# Patient Record
Sex: Male | Born: 1950
Health system: Southern US, Community
[De-identification: ages and names within clinical notes are randomized; demographics above are authoritative.]

## PROBLEM LIST (undated history)

## (undated) DIAGNOSIS — N529 Male erectile dysfunction, unspecified: Secondary | ICD-10-CM

## (undated) DIAGNOSIS — Z973 Presence of spectacles and contact lenses: Secondary | ICD-10-CM

## (undated) DIAGNOSIS — H269 Unspecified cataract: Secondary | ICD-10-CM

## (undated) DIAGNOSIS — F32A Depression, unspecified: Secondary | ICD-10-CM

## (undated) DIAGNOSIS — G4733 Obstructive sleep apnea (adult) (pediatric): Secondary | ICD-10-CM

## (undated) DIAGNOSIS — J189 Pneumonia, unspecified organism: Secondary | ICD-10-CM

## (undated) DIAGNOSIS — Z974 Presence of external hearing-aid: Secondary | ICD-10-CM

## (undated) DIAGNOSIS — M1990 Unspecified osteoarthritis, unspecified site: Secondary | ICD-10-CM

## (undated) DIAGNOSIS — E119 Type 2 diabetes mellitus without complications: Secondary | ICD-10-CM

## (undated) DIAGNOSIS — F329 Major depressive disorder, single episode, unspecified: Secondary | ICD-10-CM

## (undated) DIAGNOSIS — E785 Hyperlipidemia, unspecified: Secondary | ICD-10-CM

## (undated) DIAGNOSIS — M48062 Spinal stenosis, lumbar region with neurogenic claudication: Secondary | ICD-10-CM

## (undated) DIAGNOSIS — G4730 Sleep apnea, unspecified: Secondary | ICD-10-CM

## (undated) DIAGNOSIS — F411 Generalized anxiety disorder: Secondary | ICD-10-CM

## (undated) DIAGNOSIS — G894 Chronic pain syndrome: Secondary | ICD-10-CM

## (undated) DIAGNOSIS — J309 Allergic rhinitis, unspecified: Secondary | ICD-10-CM

## (undated) DIAGNOSIS — Z9989 Dependence on other enabling machines and devices: Secondary | ICD-10-CM

## (undated) DIAGNOSIS — I10 Essential (primary) hypertension: Secondary | ICD-10-CM

## (undated) DIAGNOSIS — T7840XA Allergy, unspecified, initial encounter: Secondary | ICD-10-CM

## (undated) HISTORY — DX: Chronic pain syndrome: G89.4

## (undated) HISTORY — DX: Spinal stenosis, lumbar region with neurogenic claudication: M48.062

## (undated) HISTORY — DX: Hyperlipidemia, unspecified: E78.5

## (undated) HISTORY — DX: Allergic rhinitis, unspecified: J30.9

## (undated) HISTORY — DX: Generalized anxiety disorder: F41.1

## (undated) HISTORY — DX: Unspecified cataract: H26.9

## (undated) HISTORY — DX: Essential (primary) hypertension: I10

## (undated) HISTORY — PX: EYE SURGERY: SHX253

## (undated) HISTORY — DX: Allergy, unspecified, initial encounter: T78.40XA

## (undated) HISTORY — PX: COLONOSCOPY: SHX174

## (undated) HISTORY — PX: ANKLE FRACTURE SURGERY: SHX122

## (undated) HISTORY — DX: Male erectile dysfunction, unspecified: N52.9

## (undated) HISTORY — PX: SHOULDER ARTHROSCOPY: SHX128

## (undated) HISTORY — DX: Type 2 diabetes mellitus without complications: E11.9

---

## 2005-01-07 HISTORY — PX: KNEE ARTHROSCOPY: SHX127

## 2008-11-23 ENCOUNTER — Encounter: Admission: RE | Admit: 2008-11-23 | Discharge: 2008-11-23 | Payer: Self-pay | Admitting: Sports Medicine

## 2008-12-14 ENCOUNTER — Ambulatory Visit (HOSPITAL_BASED_OUTPATIENT_CLINIC_OR_DEPARTMENT_OTHER): Admission: RE | Admit: 2008-12-14 | Discharge: 2008-12-15 | Payer: Self-pay | Admitting: Orthopedic Surgery

## 2010-04-10 LAB — BASIC METABOLIC PANEL
BUN: 12 mg/dL (ref 6–23)
CO2: 28 mEq/L (ref 19–32)
Calcium: 9.3 mg/dL (ref 8.4–10.5)
Chloride: 104 mEq/L (ref 96–112)
Creatinine, Ser: 0.79 mg/dL (ref 0.4–1.5)
GFR calc Af Amer: >60 mL/min (ref >60)
GFR calc non Af Amer: >60 mL/min (ref >60)
Glucose, Bld: 116 mg/dL — ABNORMAL HIGH (ref 70–99)
Potassium: 4.4 mEq/L (ref 3.5–5.1)
Sodium: 139 mEq/L (ref 135–145)

## 2010-04-10 LAB — GLUCOSE, CAPILLARY
Glucose-Capillary: 106 mg/dL — ABNORMAL HIGH (ref 70–99)
Glucose-Capillary: 126 mg/dL — ABNORMAL HIGH (ref 70–99)

## 2010-04-10 LAB — POCT HEMOGLOBIN-HEMACUE: Hemoglobin: 15.4 g/dL (ref 13.0–17.0)

## 2011-09-30 ENCOUNTER — Encounter (HOSPITAL_BASED_OUTPATIENT_CLINIC_OR_DEPARTMENT_OTHER): Payer: Self-pay | Admitting: *Deleted

## 2011-09-30 NOTE — Progress Notes (Signed)
Pt here 2010 for shoulder-did well-does use a cpap-will bring dos-will wear post op To come in for bmet-ekg

## 2011-10-01 ENCOUNTER — Encounter (HOSPITAL_BASED_OUTPATIENT_CLINIC_OR_DEPARTMENT_OTHER)
Admission: RE | Admit: 2011-10-01 | Discharge: 2011-10-01 | Disposition: A | Discharge status: Home / Self Care | Payer: 59 | Source: Ambulatory Visit | Attending: Orthopedic Surgery | Admitting: Orthopedic Surgery

## 2011-10-01 LAB — BASIC METABOLIC PANEL
BUN: 12 mg/dL (ref 6–23)
CO2: 25 mEq/L (ref 19–32)
Calcium: 9.6 mg/dL (ref 8.4–10.5)
Chloride: 102 mEq/L (ref 96–112)
Creatinine, Ser: 0.75 mg/dL (ref 0.50–1.35)
GFR calc Af Amer: >90 mL/min (ref >90)
GFR calc non Af Amer: >90 mL/min (ref >90)
Glucose, Bld: 208 mg/dL — ABNORMAL HIGH (ref 70–99)
Potassium: 4.6 mEq/L (ref 3.5–5.1)
Sodium: 137 mEq/L (ref 135–145)

## 2011-10-01 NOTE — Progress Notes (Signed)
Dr Gypsy Balsam cleared ekg

## 2011-10-02 NOTE — H&P (Signed)
[facility] [address]   [address] [place], [place] [address] [phone] A Division of [facility]  Loreta Ave, M.D.   Robert A. Thurston Hole, M.D.   Burnell Blanks, M.D.   Eulas Post, M.D.   Lunette Stands, M.D Buford Dresser, M.D.  Charlsie Quest, M.D.   Estell Harpin, M.D.   Melina Fiddler, M.D. Genene Churn. Barry Dienes, PA-C            Kirstin A. Shepperson, PA-C Josh Kensington, PA-C [place], [place]   RE: Albert Benjamin, Albert Benjamin                                [id number]      DOB: 12/09/1950 PROGRESS NOTE: 09-04-11 SUBJECTIVE: Albert Benjamin is a 61 year-old man here to discuss his right knee pain.  He has had worsening chronic knee pain for the last four weeks, however yesterday he felt a pop on the medial aspect of his right knee and his knee has become locked in extension.  He notes significant pain when he tries to flex his knee.  Additionally, he describes a mild effusion.  Some over the counter pain medications have been minimally effective.  He mostly experiences pain on the medial aspect of his knee without radiation.  No weakness or numbness.   Past medical history: Significant for diabetes and a history of a left knee arthroscopic procedure for a similar incident.  Review of systems: Please see HPI. Allergies: No known drug allergies. Current medications: Significant for Citalopram, Glimepiride and Singulair.   Family history: Significant for diabetes. Social history: He is a nonsmoker.  OBJECTIVE: In general, well appearing man in no acute distress.  Height: 5?9.  Weight: 250 pounds.  Right knee mild effusion.  Tenderness to palpation over the medial joint line.  Range of motion, patient is unable to flex his knee further than about 20 degrees.  Unable to fully do McMurray's, however positive with rotation of the knee on the medial aspect.  Negative Lachman.  Negative valgus and varus stress.  Vascular exam: Pulses are  2+ throughout.        X-RAYS: Three view knee does not show any fractures or misalignment.    ASSESSMENT: Right knee pain.  PLAN: Suspect meniscus injury, plan for MRI to fully work this issue up.  We will call the patient after the MRI and schedule with surgeon if needed.  Patient expresses understanding.    Estell Harpin, M.D.  Electronically verified by Estell Harpin, M.D. JSK(ESC):jjh Cc: Dr. Michel Santee Eyk  Jenie Parish/[facility] [address]   [address] [place], [place] [address] [phone] A Division of [facility]  Loreta Ave, M.D.   Robert A. Thurston Hole, M.D.   Burnell Blanks, M.D.   Eulas Post, M.D.   Lunette Stands, M.D Buford Dresser, M.D.  Charlsie Quest, M.D.   Estell Harpin, M.D.   Melina Fiddler, M.D. Genene Churn. Barry Dienes, PA-C            Kirstin A. Shepperson, PA-C Josh Spurgeon, PA-C [place], [place]   RE: Albert Benjamin, Albert Benjamin                                [id number]      DOB: 11-15-1950  PROGRESS NOTE: 09-10-11 Albert Benjamin returns for follow up to review MRI of his right knee.  Referred by Dr. Farris Has.  This is an old patient of mine that I have operated on in the past, including a left knee arthroscopy for meniscus tear on August 27, 2004.  Fully recovered, rehabbed and he is pleased with those results.  Right rotator cuff repair on May 02, 2003.  Right shoulder Bankart reconstruction and repair of tuberosity fracture on December 14, 2008.  All of these fully recovered and rehabbed. New issue is his right knee.  Insidious onset of symptoms.  Getting worse and worse.  Progressing to locking and catching.  Affecting all activities, including activities of daily living.  X-rays done in the past show some medial joint space narrowing on the left, the right looks fairly good.  No acute fractures.  MRI of his right knee from September 06, 2011 shows medial meniscus tear, as well as some chondromalacia patellofemoral  joint and medial compartment.  All of these have been reviewed and discussed with him.   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  He comes in to discuss definitive treatment.   Entire history is reviewed, updated and included in the chart.  EXAMINATION: General exam is outlined and included in the chart.  Specifically, right knee point tender medial joint line.  Positive McMurray's.  Trace effusion.  Stable ligaments.  Neurovascularly intact distally.    DISPOSITION:  Medial meniscus tear, right knee.  Sufficient symptoms to warrant treatment.  Marked mechanical symptoms interfering with all activity.  Discussed exam under anesthesia, arthroscopy, debridement.  He cannot work now and I am keeping him out of work from now until approximately six weeks post-op.  I went over all of this with him and he understands.  All questions answered.  All paperwork complete.  I will see him at the time of operative intervention.    Loreta Ave, M.D.   Electronically verified by Loreta Ave, M.D. DFM:jjh D 09-10-11 T 09-11-11

## 2011-10-03 ENCOUNTER — Ambulatory Visit (HOSPITAL_BASED_OUTPATIENT_CLINIC_OR_DEPARTMENT_OTHER): Payer: 59 | Admitting: *Deleted

## 2011-10-03 ENCOUNTER — Encounter (HOSPITAL_BASED_OUTPATIENT_CLINIC_OR_DEPARTMENT_OTHER): Payer: Self-pay | Admitting: *Deleted

## 2011-10-03 ENCOUNTER — Ambulatory Visit (HOSPITAL_BASED_OUTPATIENT_CLINIC_OR_DEPARTMENT_OTHER)
Admission: RE | Admit: 2011-10-03 | Discharge: 2011-10-03 | Disposition: A | Discharge status: Home / Self Care | Payer: 59 | Source: Ambulatory Visit | Attending: Orthopedic Surgery | Admitting: Orthopedic Surgery

## 2011-10-03 ENCOUNTER — Encounter (HOSPITAL_BASED_OUTPATIENT_CLINIC_OR_DEPARTMENT_OTHER)
Admission: RE | Disposition: A | Discharge status: Home / Self Care | Payer: Self-pay | Source: Ambulatory Visit | Attending: Orthopedic Surgery

## 2011-10-03 DIAGNOSIS — M23305 Other meniscus derangements, unspecified medial meniscus, unspecified knee: Secondary | ICD-10-CM | POA: Insufficient documentation

## 2011-10-03 DIAGNOSIS — G4730 Sleep apnea, unspecified: Secondary | ICD-10-CM | POA: Insufficient documentation

## 2011-10-03 DIAGNOSIS — Z01812 Encounter for preprocedural laboratory examination: Secondary | ICD-10-CM | POA: Insufficient documentation

## 2011-10-03 DIAGNOSIS — Z9889 Other specified postprocedural states: Secondary | ICD-10-CM

## 2011-10-03 DIAGNOSIS — E119 Type 2 diabetes mellitus without complications: Secondary | ICD-10-CM | POA: Insufficient documentation

## 2011-10-03 DIAGNOSIS — Z01810 Encounter for preprocedural cardiovascular examination: Secondary | ICD-10-CM | POA: Insufficient documentation

## 2011-10-03 DIAGNOSIS — M2240 Chondromalacia patellae, unspecified knee: Secondary | ICD-10-CM | POA: Insufficient documentation

## 2011-10-03 DIAGNOSIS — J45909 Unspecified asthma, uncomplicated: Secondary | ICD-10-CM | POA: Insufficient documentation

## 2011-10-03 HISTORY — DX: Presence of external hearing-aid: Z97.4

## 2011-10-03 HISTORY — DX: Major depressive disorder, single episode, unspecified: F32.9

## 2011-10-03 HISTORY — DX: Depression, unspecified: F32.A

## 2011-10-03 HISTORY — DX: Unspecified osteoarthritis, unspecified site: M19.90

## 2011-10-03 HISTORY — DX: Sleep apnea, unspecified: G47.30

## 2011-10-03 HISTORY — DX: Presence of spectacles and contact lenses: Z97.3

## 2011-10-03 HISTORY — DX: Obstructive sleep apnea (adult) (pediatric): G47.33

## 2011-10-03 HISTORY — DX: Dependence on other enabling machines and devices: Z99.89

## 2011-10-03 LAB — GLUCOSE, CAPILLARY
Glucose-Capillary: 152 mg/dL — ABNORMAL HIGH (ref 70–99)
Glucose-Capillary: 144 mg/dL — ABNORMAL HIGH (ref 70–99)

## 2011-10-03 SURGERY — ARTHROSCOPY, KNEE, WITH MEDIAL MENISCECTOMY
Anesthesia: General | Site: Knee | Laterality: Right | Wound class: Clean

## 2011-10-03 MED ORDER — LIDOCAINE HCL (CARDIAC) 20 MG/ML IV SOLN
INTRAVENOUS | Status: DC | PRN
  Administered 2011-10-03: 40 mg via INTRAVENOUS

## 2011-10-03 MED ORDER — METHYLPREDNISOLONE ACETATE 80 MG/ML IJ SUSP
INTRAMUSCULAR | Status: DC | PRN
  Administered 2011-10-03: 80 mg via INTRA_ARTICULAR

## 2011-10-03 MED ORDER — ONDANSETRON HCL 4 MG/2ML IJ SOLN
INTRAMUSCULAR | Status: DC | PRN
  Administered 2011-10-03: 4 mg via INTRAVENOUS

## 2011-10-03 MED ORDER — DEXAMETHASONE SODIUM PHOSPHATE 10 MG/ML IJ SOLN
INTRAMUSCULAR | Status: DC | PRN
  Administered 2011-10-03: 5 mg via INTRAVENOUS

## 2011-10-03 MED ORDER — LACTATED RINGERS IV SOLN
INTRAVENOUS | Status: DC
  Administered 2011-10-03 (×2): via INTRAVENOUS

## 2011-10-03 MED ORDER — MEPERIDINE HCL 25 MG/ML IJ SOLN: 6.2500 mg | INTRAMUSCULAR | Status: DC | PRN

## 2011-10-03 MED ORDER — EPHEDRINE SULFATE 50 MG/ML IJ SOLN
INTRAMUSCULAR | Status: DC | PRN
  Administered 2011-10-03: 10 mg via INTRAVENOUS

## 2011-10-03 MED ORDER — MIDAZOLAM HCL 2 MG/2ML IJ SOLN: 0.5000 mg | Freq: Once | INTRAMUSCULAR | Status: DC | PRN

## 2011-10-03 MED ORDER — OXYCODONE-ACETAMINOPHEN 5-325 MG PO TABS
1.0000 | ORAL_TABLET | Freq: Once | ORAL | Status: AC | PRN
  Administered 2011-10-03: 1 via ORAL

## 2011-10-03 MED ORDER — BUPIVACAINE HCL (PF) 0.5 % IJ SOLN
INTRAMUSCULAR | Status: DC | PRN
  Administered 2011-10-03: 20 mL via INTRA_ARTICULAR

## 2011-10-03 MED ORDER — HYDROMORPHONE HCL PF 1 MG/ML IJ SOLN
0.2500 mg | INTRAMUSCULAR | Status: DC | PRN
  Administered 2011-10-03: 0.5 mg via INTRAVENOUS

## 2011-10-03 MED ORDER — MIDAZOLAM HCL 5 MG/5ML IJ SOLN
INTRAMUSCULAR | Status: DC | PRN
  Administered 2011-10-03: 1 mg via INTRAVENOUS

## 2011-10-03 MED ORDER — FENTANYL CITRATE 0.05 MG/ML IJ SOLN
INTRAMUSCULAR | Status: DC | PRN
Start: 2011-10-03 — End: 2011-10-03
  Administered 2011-10-03 (×2): 25 ug via INTRAVENOUS
  Administered 2011-10-03: 50 ug via INTRAVENOUS

## 2011-10-03 MED ORDER — SODIUM CHLORIDE 0.9 % IR SOLN
Status: DC | PRN
  Administered 2011-10-03: 9000 mL

## 2011-10-03 MED ORDER — PROMETHAZINE HCL 25 MG/ML IJ SOLN: 6.2500 mg | INTRAMUSCULAR | Status: DC | PRN

## 2011-10-03 MED ORDER — OXYCODONE-ACETAMINOPHEN 5-325 MG PO TABS: 1.0000 | ORAL_TABLET | ORAL | Status: DC | PRN

## 2011-10-03 MED ORDER — PROPOFOL 10 MG/ML IV BOLUS
INTRAVENOUS | Status: DC | PRN
  Administered 2011-10-03: 300 mg via INTRAVENOUS

## 2011-10-03 MED ORDER — CEFAZOLIN SODIUM-DEXTROSE 2-3 GM-% IV SOLR
2.0000 g | INTRAVENOUS | Status: AC
  Administered 2011-10-03: 2 g via INTRAVENOUS

## 2011-10-03 SURGICAL SUPPLY — 38 items
BANDAGE ELASTIC 6 VELCRO ST LF (GAUZE/BANDAGES/DRESSINGS) ×2 IMPLANT
BLADE CUDA 5.5 (BLADE) IMPLANT
BLADE CUDA GRT WHITE 3.5 (BLADE) IMPLANT
BLADE CUTTER GATOR 3.5 (BLADE) ×2 IMPLANT
BLADE CUTTER MENIS 5.5 (BLADE) IMPLANT
BLADE GREAT WHITE 4.2 (BLADE) ×2 IMPLANT
BUR OVAL 4.0 (BURR) IMPLANT
CANISTER OMNI JUG 16 LITER (MISCELLANEOUS) ×2 IMPLANT
CANISTER SUCTION 2500CC (MISCELLANEOUS) IMPLANT
CLOTH BEACON ORANGE TIMEOUT ST (SAFETY) ×2 IMPLANT
CUTTER MENISCUS  4.2MM (BLADE)
CUTTER MENISCUS 4.2MM (BLADE) IMPLANT
DRAPE ARTHROSCOPY W/POUCH 90 (DRAPES) ×2 IMPLANT
DURAPREP 26ML APPLICATOR (WOUND CARE) ×2 IMPLANT
ELECT MENISCUS 165MM 90D (ELECTRODE) ×2 IMPLANT
ELECT REM PT RETURN 9FT ADLT (ELECTROSURGICAL) ×2
ELECTRODE REM PT RTRN 9FT ADLT (ELECTROSURGICAL) ×1 IMPLANT
GAUZE XEROFORM 1X8 LF (GAUZE/BANDAGES/DRESSINGS) ×2 IMPLANT
GLOVE BIO SURGEON STRL SZ 6.5 (GLOVE) ×2 IMPLANT
GLOVE BIOGEL PI IND STRL 7.0 (GLOVE) ×1 IMPLANT
GLOVE BIOGEL PI IND STRL 8 (GLOVE) ×1 IMPLANT
GLOVE BIOGEL PI INDICATOR 7.0 (GLOVE) ×1
GLOVE BIOGEL PI INDICATOR 8 (GLOVE) ×1
GLOVE ORTHO TXT STRL SZ7.5 (GLOVE) ×4 IMPLANT
GOWN PREVENTION PLUS XLARGE (GOWN DISPOSABLE) ×4 IMPLANT
GOWN STRL REIN 2XL XLG LVL4 (GOWN DISPOSABLE) ×2 IMPLANT
HOLDER KNEE FOAM BLUE (MISCELLANEOUS) ×2 IMPLANT
KNEE WRAP E Z 3 GEL PACK (MISCELLANEOUS) ×2 IMPLANT
PACK ARTHROSCOPY DSU (CUSTOM PROCEDURE TRAY) ×2 IMPLANT
PACK BASIN DAY SURGERY FS (CUSTOM PROCEDURE TRAY) ×2 IMPLANT
PENCIL BUTTON HOLSTER BLD 10FT (ELECTRODE) ×2 IMPLANT
SET ARTHROSCOPY TUBING (MISCELLANEOUS) ×1
SET ARTHROSCOPY TUBING LN (MISCELLANEOUS) ×1 IMPLANT
SPONGE GAUZE 4X4 12PLY (GAUZE/BANDAGES/DRESSINGS) ×4 IMPLANT
SUT ETHILON 3 0 PS 1 (SUTURE) ×2 IMPLANT
SUT VIC AB 3-0 FS2 27 (SUTURE) IMPLANT
TOWEL OR 17X24 6PK STRL BLUE (TOWEL DISPOSABLE) ×2 IMPLANT
WATER STERILE IRR 1000ML POUR (IV SOLUTION) ×2 IMPLANT

## 2011-10-03 NOTE — Anesthesia Preprocedure Evaluation (Addendum)
Anesthesia Evaluation  Patient identified by MRN, date of birth, ID band Patient awake    Reviewed: Allergy & Precautions, H&P , NPO status , Patient's Chart, lab work & pertinent test results  History of Anesthesia Complications Negative for: history of anesthetic complications  Airway Mallampati: III TM Distance: >3 FB Neck ROM: Full    Dental  (+) Teeth Intact, Dental Advisory Given and Caps   Pulmonary asthma (last inhaler needed 3 weeks ago) , sleep apnea and Continuous Positive Airway Pressure Ventilation ,  breath sounds clear to auscultation  Pulmonary exam normal       Cardiovascular negative cardio ROS  Rhythm:Regular Rate:Normal     Neuro/Psych PSYCHIATRIC DISORDERS Depression negative neurological ROS     GI/Hepatic negative GI ROS, Neg liver ROS,   Endo/Other  diabetes (glu 152), Well Controlled, Type 2, Oral Hypoglycemic AgentsMorbid obesity  Renal/GU negative Renal ROS     Musculoskeletal   Abdominal (+) + obese,   Peds  Hematology   Anesthesia Other Findings   Reproductive/Obstetrics                           Anesthesia Physical Anesthesia Plan  ASA: III  Anesthesia Plan: General   Post-op Pain Management:    Induction: Intravenous  Airway Management Planned: LMA  Additional Equipment:   Intra-op Plan:   Post-operative Plan:   Informed Consent: I have reviewed the patients History and Physical, chart, labs and discussed the procedure including the risks, benefits and alternatives for the proposed anesthesia with the patient or authorized representative who has indicated his/her understanding and acceptance.   Dental advisory given  Plan Discussed with: CRNA and Surgeon  Anesthesia Plan Comments: (Plan routine monitors, GA- LMA OK)        Anesthesia Quick Evaluation

## 2011-10-03 NOTE — Brief Op Note (Signed)
10/03/2011  8:43 AM  PATIENT:  Darcey Nora  61 y.o. male  PRE-OPERATIVE DIAGNOSIS:  right knee medial meniscus tear  POST-OPERATIVE DIAGNOSIS:  right knee medial meniscus tear  PROCEDURE:  Procedure(s) (LRB) with comments: KNEE ARTHROSCOPY WITH MEDIAL MENISECTOMY (Right) - right knee arthroscopy with medial menisectomy   SURGEON:  Surgeon(s) and Role:    * Loreta Ave, MD - Primary  PHYSICIAN ASSISTANT: Zonia Kief M   ANESTHESIA:   general  EBL:  Total I/O In: 1200 [I.V.:1200] Out: -   SPECIMEN:  No Specimen  DISPOSITION OF SPECIMEN:  N/A  COUNTS:  YES PATIENT DISPOSITION:  PACU - hemodynamically stable.

## 2011-10-03 NOTE — Anesthesia Procedure Notes (Signed)
Procedure Name: LMA Insertion Date/Time: 10/03/2011 7:47 AM Performed by: Meyer Russel Pre-anesthesia Checklist: Patient identified, Emergency Drugs available, Suction available and Patient being monitored Patient Re-evaluated:Patient Re-evaluated prior to inductionOxygen Delivery Method: Circle System Utilized Preoxygenation: Pre-oxygenation with 100% oxygen Intubation Type: IV induction Ventilation: Mask ventilation without difficulty LMA: LMA inserted LMA Size: 5.0 Number of attempts: 1 Airway Equipment and Method: bite block Placement Confirmation: positive ETCO2 and breath sounds checked- equal and bilateral Tube secured with: Tape Dental Injury: Teeth and Oropharynx as per pre-operative assessment

## 2011-10-03 NOTE — Transfer of Care (Signed)
Immediate Anesthesia Transfer of Care Note  Patient: Albert Benjamin  Procedure(s) Performed: Procedure(s) (LRB) with comments: KNEE ARTHROSCOPY WITH MEDIAL MENISECTOMY (Right) - right knee arthroscopy with medial menisectomy   Patient Location: PACU  Anesthesia Type: General  Level of Consciousness: awake and sedated  Airway & Oxygen Therapy: Patient Spontanous Breathing and Patient connected to face mask oxygen  Post-op Assessment: Report given to PACU RN, Post -op Vital signs reviewed and stable and Patient moving all extremities  Post vital signs: Reviewed and stable  Complications: No apparent anesthesia complications

## 2011-10-03 NOTE — Anesthesia Postprocedure Evaluation (Signed)
  Anesthesia Post-op Note  Patient: Albert Benjamin  Procedure(s) Performed: Procedure(s) (LRB) with comments: KNEE ARTHROSCOPY WITH MEDIAL MENISECTOMY (Right) - right knee arthroscopy with medial menisectomy   Patient Location: PACU  Anesthesia Type: General  Level of Consciousness: awake, alert , oriented and patient cooperative  Airway and Oxygen Therapy: Patient Spontanous Breathing  Post-op Pain: none  Post-op Assessment: Post-op Vital signs reviewed, Patient's Cardiovascular Status Stable, Respiratory Function Stable, Patent Airway, No signs of Nausea or vomiting and Pain level controlled  Post-op Vital Signs: Reviewed and stable  Complications: No apparent anesthesia complications

## 2011-10-03 NOTE — Interval H&P Note (Signed)
History and Physical Interval Note:  10/03/2011 7:34 AM  Albert Benjamin  has presented today for surgery, with the diagnosis of right knee tear meniscus medial   The various methods of treatment have been discussed with the patient and family. After consideration of risks, benefits and other options for treatment, the patient has consented to  Procedure(s) (LRB) with comments: KNEE ARTHROSCOPY WITH MEDIAL MENISECTOMY (Right) - right knee arthroscopy with menisectomy medial as a surgical intervention .  The patient's history has been reviewed, patient examined, no change in status, stable for surgery.  I have reviewed the patient's chart and labs.  Questions were answered to the patient's satisfaction.     Celines Femia F

## 2011-10-04 LAB — POCT HEMOGLOBIN-HEMACUE: Hemoglobin: 12.3 g/dL — ABNORMAL LOW (ref 13.0–17.0)

## 2011-10-04 NOTE — Op Note (Signed)
NAMEILHAN, MADAN NO.:  [id number]  MEDICAL RECORD NO.:  [id number]  LOCATION:                                 FACILITY:  PHYSICIAN:  Loreta Ave, M.D. DATE OF BIRTH:  1950-03-07  DATE OF PROCEDURE:  10/03/2011 DATE OF DISCHARGE:                              OPERATIVE REPORT   PREOPERATIVE DIAGNOSIS:  Right knee medial meniscus tear.  POSTOPERATIVE DIAGNOSIS:  Right knee medial meniscus tear with some grade 2 chondral changes, patellofemoral joint as well as medial compartment.  PROCEDURES: 1. Right knee exam under anesthesia, arthroscopy. 2. Partial medial meniscectomy. 3. Chondroplasty, patellofemoral joint and medial femoral condyle.  SURGEON:  Loreta Ave, M.D.  ASSISTANT:  Genene Churn. Denton Meek.  ANESTHESIA:  General.  BLOOD LOSS:  Minimal.  SPECIMENS:  None.  CULTURES:  None.  COMPLICATIONS:  None.  DRESSINGS:  Sterile compressive.  PROCEDURE IN DETAIL:  The patient brought to the operating room and placed on the operating table in supine position.  After adequate anesthesia had been obtained, leg holder applied.  Leg prepped and draped in usual sterile fashion.  Two portals, one in each medial and lateral parapatellar.  Arthroscope was introduced, knee extended and inspected.  Complex tearing, medial meniscus.  Radial tear __________ extension into the posterior half with degeneration throughout. Posterior third half of the meniscus removed, tapered into remaining meniscus.  Grade 2 and mild grade 3 changes on the condyle debrided. Some grade 2 changes, patellofemoral joint debrided as well.  Lateral meniscus, lateral compartment, cruciate ligaments intact.  Entire knee examined, no other findings appreciated.  Instruments were removed.  Portals injected with Marcaine and closed with nylon.  Knee injected with Depo-Medrol and Marcaine. Anesthesia reversed.  Brought to recovery room.  Tolerated the surgery well.  No  complications.     Loreta Ave, M.D.     DFM/MEDQ  D:  10/03/2011  T:  10/04/2011  Job:  [id number]

## 2012-02-18 ENCOUNTER — Other Ambulatory Visit (HOSPITAL_COMMUNITY): Payer: Self-pay | Admitting: Orthopedic Surgery

## 2012-02-18 ENCOUNTER — Ambulatory Visit (HOSPITAL_COMMUNITY)
Admission: RE | Admit: 2012-02-18 | Discharge: 2012-02-18 | Disposition: A | Discharge status: Home / Self Care | Payer: 59 | Source: Ambulatory Visit | Attending: Orthopedic Surgery | Admitting: Orthopedic Surgery

## 2012-02-18 DIAGNOSIS — M542 Cervicalgia: Secondary | ICD-10-CM

## 2012-02-18 DIAGNOSIS — M5020 Other cervical disc displacement, unspecified cervical region: Secondary | ICD-10-CM | POA: Insufficient documentation

## 2012-02-18 DIAGNOSIS — M79609 Pain in unspecified limb: Secondary | ICD-10-CM | POA: Insufficient documentation

## 2012-02-18 DIAGNOSIS — R29898 Other symptoms and signs involving the musculoskeletal system: Secondary | ICD-10-CM | POA: Insufficient documentation

## 2012-02-18 DIAGNOSIS — M47812 Spondylosis without myelopathy or radiculopathy, cervical region: Secondary | ICD-10-CM | POA: Insufficient documentation

## 2012-03-02 ENCOUNTER — Other Ambulatory Visit: Payer: Self-pay | Admitting: Neurosurgery

## 2012-03-03 ENCOUNTER — Encounter (HOSPITAL_COMMUNITY): Payer: Self-pay | Admitting: Pharmacy Technician

## 2012-03-06 ENCOUNTER — Encounter (HOSPITAL_COMMUNITY)
Admission: RE | Admit: 2012-03-06 | Discharge: 2012-03-06 | Disposition: A | Discharge status: Home / Self Care | Payer: 59 | Source: Ambulatory Visit | Attending: Neurosurgery | Admitting: Neurosurgery

## 2012-03-06 ENCOUNTER — Encounter (HOSPITAL_COMMUNITY): Payer: Self-pay

## 2012-03-06 LAB — BASIC METABOLIC PANEL
BUN: 18 mg/dL (ref 6–23)
CO2: 29 mEq/L (ref 19–32)
Calcium: 10 mg/dL (ref 8.4–10.5)
Chloride: 104 mEq/L (ref 96–112)
Creatinine, Ser: 0.95 mg/dL (ref 0.50–1.35)
GFR calc Af Amer: >90 mL/min (ref >90)
GFR calc non Af Amer: 87 mL/min — ABNORMAL LOW (ref >90)
Glucose, Bld: 194 mg/dL — ABNORMAL HIGH (ref 70–99)
Potassium: 4.9 mEq/L (ref 3.5–5.1)
Sodium: 141 mEq/L (ref 135–145)

## 2012-03-06 LAB — CBC
HCT: 44.8 % (ref 39.0–52.0)
Hemoglobin: 15.8 g/dL (ref 13.0–17.0)
MCH: 31.1 pg (ref 26.0–34.0)
MCHC: 35.3 g/dL (ref 30.0–36.0)
MCV: 88.2 fL (ref 78.0–100.0)
Platelets: 193 10*3/uL (ref 150–400)
RBC: 5.08 MIL/uL (ref 4.22–5.81)
RDW: 12.5 % (ref 11.5–15.5)
WBC: 8.2 10*3/uL (ref 4.0–10.5)

## 2012-03-06 LAB — SURGICAL PCR SCREEN
MRSA, PCR: NEGATIVE
Staphylococcus aureus: NEGATIVE

## 2012-03-06 NOTE — Pre-Procedure Instructions (Addendum)
Albert Benjamin  03/06/2012   Your procedure is scheduled on:  Wednesday 5th.  Report to [facility] at 9:30 AM.   Call this number if you have problems the morning of surgery: [phone]   Remember:   Do not eat food or drink liquids after midnight.    Take these medicines the morning of surgery with A SIP OF WATER:Certirizine (Zyrtec), Citalopram  (Celexa).  May take Methocarbamol (Robaxin) if needed.    Do not wear jewelry, make-up or nail polish.  Do not wear lotions, powders, or perfumes. You may wear deodorant.   Men may shave face and neck.  Do not bring valuables to the hospital.  Contacts, dentures or bridgework may not be worn into surgery.  Leave suitcase in the car. After surgery it may be brought to your room.  For patients admitted to the hospital, checkout time is 11:00 AM the day of discharge.   Patients discharged the day of surgery will not be allowed to drive home.  Name and phone number of your driver: -           Special Instructions: Shower using CHG 2 nights before surgery and the night before surgery.  If you shower the day of surgery use CHG.  Use special wash - you have one bottle of CHG for all showers.  You should use approximately 1/3 of the bottle for each shower.   Please read over the following fact sheets that you were given: Pain Booklet, Coughing and Deep Breathing and Surgical Site Infection Prevention

## 2012-03-10 MED ORDER — CEFAZOLIN SODIUM-DEXTROSE 2-3 GM-% IV SOLR
2.0000 g | INTRAVENOUS | Status: AC
  Administered 2012-03-11: 2 g via INTRAVENOUS
  Filled 2012-03-10: qty 50

## 2012-03-11 ENCOUNTER — Encounter (HOSPITAL_COMMUNITY): Payer: Self-pay | Admitting: Anesthesiology

## 2012-03-11 ENCOUNTER — Encounter (HOSPITAL_COMMUNITY): Payer: Self-pay | Admitting: *Deleted

## 2012-03-11 ENCOUNTER — Inpatient Hospital Stay (HOSPITAL_COMMUNITY): Payer: 59

## 2012-03-11 ENCOUNTER — Ambulatory Visit (HOSPITAL_COMMUNITY): Payer: 59

## 2012-03-11 ENCOUNTER — Inpatient Hospital Stay (HOSPITAL_COMMUNITY)
Admission: RE | Admit: 2012-03-11 | Discharge: 2012-03-14 | DRG: 473 | Disposition: A | Discharge status: Home / Self Care | Payer: 59 | Source: Ambulatory Visit | Attending: Neurosurgery | Admitting: Neurosurgery

## 2012-03-11 ENCOUNTER — Ambulatory Visit (HOSPITAL_COMMUNITY): Payer: 59 | Admitting: Anesthesiology

## 2012-03-11 ENCOUNTER — Encounter (HOSPITAL_COMMUNITY)
Admission: RE | Disposition: A | Discharge status: Home / Self Care | Payer: Self-pay | Source: Ambulatory Visit | Attending: Neurosurgery

## 2012-03-11 DIAGNOSIS — M129 Arthropathy, unspecified: Secondary | ICD-10-CM | POA: Diagnosis present

## 2012-03-11 DIAGNOSIS — Z6837 Body mass index (BMI) 37.0-37.9, adult: Secondary | ICD-10-CM

## 2012-03-11 DIAGNOSIS — G4733 Obstructive sleep apnea (adult) (pediatric): Secondary | ICD-10-CM | POA: Diagnosis present

## 2012-03-11 DIAGNOSIS — F329 Major depressive disorder, single episode, unspecified: Secondary | ICD-10-CM | POA: Diagnosis present

## 2012-03-11 DIAGNOSIS — E119 Type 2 diabetes mellitus without complications: Secondary | ICD-10-CM | POA: Diagnosis present

## 2012-03-11 DIAGNOSIS — M4802 Spinal stenosis, cervical region: Principal | ICD-10-CM | POA: Diagnosis present

## 2012-03-11 DIAGNOSIS — F3289 Other specified depressive episodes: Secondary | ICD-10-CM | POA: Diagnosis present

## 2012-03-11 HISTORY — PX: ANTERIOR CERVICAL DECOMP/DISCECTOMY FUSION: SHX1161

## 2012-03-11 LAB — GLUCOSE, CAPILLARY
Glucose-Capillary: 211 mg/dL — ABNORMAL HIGH (ref 70–99)
Glucose-Capillary: 146 mg/dL — ABNORMAL HIGH (ref 70–99)
Glucose-Capillary: 179 mg/dL — ABNORMAL HIGH (ref 70–99)

## 2012-03-11 SURGERY — ANTERIOR CERVICAL DECOMPRESSION/DISCECTOMY FUSION 3 LEVELS
Anesthesia: General | Site: Neck | Wound class: Clean

## 2012-03-11 MED ORDER — HYDROMORPHONE HCL PF 1 MG/ML IJ SOLN
0.2500 mg | INTRAMUSCULAR | Status: DC | PRN
  Administered 2012-03-11 (×2): 0.5 mg via INTRAVENOUS

## 2012-03-11 MED ORDER — CEFAZOLIN SODIUM 1-5 GM-% IV SOLN
1.0000 g | Freq: Three times a day (TID) | INTRAVENOUS | Status: AC
  Administered 2012-03-11 – 2012-03-12 (×2): 1 g via INTRAVENOUS
  Filled 2012-03-11 (×2): qty 50

## 2012-03-11 MED ORDER — MIDAZOLAM HCL 5 MG/5ML IJ SOLN
INTRAMUSCULAR | Status: DC | PRN
  Administered 2012-03-11 (×2): 1 mg via INTRAVENOUS

## 2012-03-11 MED ORDER — PHENYLEPHRINE HCL 10 MG/ML IJ SOLN
10.0000 mg | INTRAVENOUS | Status: DC | PRN
  Administered 2012-03-11: 40 ug/min via INTRAVENOUS

## 2012-03-11 MED ORDER — DEXAMETHASONE SODIUM PHOSPHATE 4 MG/ML IJ SOLN
4.0000 mg | Freq: Four times a day (QID) | INTRAMUSCULAR | Status: DC
  Filled 2012-03-11 (×13): qty 1

## 2012-03-11 MED ORDER — LACTATED RINGERS IV SOLN
INTRAVENOUS | Status: DC | PRN
  Administered 2012-03-11 (×3): via INTRAVENOUS

## 2012-03-11 MED ORDER — PIOGLITAZONE HCL 30 MG PO TABS
30.0000 mg | ORAL_TABLET | Freq: Every day | ORAL | Status: DC
  Administered 2012-03-12 – 2012-03-14 (×3): 30 mg via ORAL
  Filled 2012-03-11 (×4): qty 1

## 2012-03-11 MED ORDER — SODIUM CHLORIDE 0.9 % IJ SOLN
3.0000 mL | Freq: Two times a day (BID) | INTRAMUSCULAR | Status: DC
  Administered 2012-03-12 – 2012-03-14 (×4): 3 mL via INTRAVENOUS

## 2012-03-11 MED ORDER — LIDOCAINE HCL 4 % MT SOLN
OROMUCOSAL | Status: DC | PRN
  Administered 2012-03-11: 4 mL via TOPICAL

## 2012-03-11 MED ORDER — DEXAMETHASONE 4 MG PO TABS
4.0000 mg | ORAL_TABLET | Freq: Four times a day (QID) | ORAL | Status: DC
  Administered 2012-03-11 – 2012-03-14 (×11): 4 mg via ORAL
  Filled 2012-03-11 (×16): qty 1

## 2012-03-11 MED ORDER — 0.9 % SODIUM CHLORIDE (POUR BTL) OPTIME
TOPICAL | Status: DC | PRN
  Administered 2012-03-11: 1000 mL

## 2012-03-11 MED ORDER — ACETAMINOPHEN 650 MG RE SUPP: 650.0000 mg | RECTAL | Status: DC | PRN

## 2012-03-11 MED ORDER — MORPHINE SULFATE 2 MG/ML IJ SOLN
1.0000 mg | INTRAMUSCULAR | Status: DC | PRN
  Administered 2012-03-11: 4 mg via INTRAVENOUS
  Administered 2012-03-12: 2 mg via INTRAVENOUS
  Administered 2012-03-13: 4 mg via INTRAVENOUS
  Filled 2012-03-11: qty 2
  Filled 2012-03-11: qty 1
  Filled 2012-03-11: qty 2

## 2012-03-11 MED ORDER — NEOSTIGMINE METHYLSULFATE 1 MG/ML IJ SOLN
INTRAMUSCULAR | Status: DC | PRN
  Administered 2012-03-11: 5 mg via INTRAVENOUS

## 2012-03-11 MED ORDER — ONDANSETRON HCL 4 MG/2ML IJ SOLN
INTRAMUSCULAR | Status: DC | PRN
  Administered 2012-03-11: 4 mg via INTRAVENOUS

## 2012-03-11 MED ORDER — THROMBIN 20000 UNITS EX SOLR
CUTANEOUS | Status: DC | PRN
  Administered 2012-03-11: 15:00:00 via TOPICAL

## 2012-03-11 MED ORDER — CITALOPRAM HYDROBROMIDE 40 MG PO TABS
40.0000 mg | ORAL_TABLET | Freq: Every day | ORAL | Status: DC
  Administered 2012-03-12 – 2012-03-14 (×3): 40 mg via ORAL
  Filled 2012-03-11 (×4): qty 1

## 2012-03-11 MED ORDER — THROMBIN 5000 UNITS EX SOLR
OROMUCOSAL | Status: DC | PRN
  Administered 2012-03-11: 15:00:00 via TOPICAL

## 2012-03-11 MED ORDER — PHENOL 1.4 % MT LIQD: 1.0000 | OROMUCOSAL | Status: DC | PRN

## 2012-03-11 MED ORDER — ACETAMINOPHEN 325 MG PO TABS: 650.0000 mg | ORAL_TABLET | ORAL | Status: DC | PRN

## 2012-03-11 MED ORDER — HYDROMORPHONE HCL PF 1 MG/ML IJ SOLN
INTRAMUSCULAR | Status: AC
  Filled 2012-03-11: qty 1

## 2012-03-11 MED ORDER — GLIPIZIDE 10 MG PO TABS
10.0000 mg | ORAL_TABLET | Freq: Two times a day (BID) | ORAL | Status: DC
  Administered 2012-03-12 – 2012-03-14 (×5): 10 mg via ORAL
  Filled 2012-03-11 (×7): qty 1

## 2012-03-11 MED ORDER — PROPOFOL 10 MG/ML IV BOLUS
INTRAVENOUS | Status: DC | PRN
  Administered 2012-03-11: 300 mg via INTRAVENOUS

## 2012-03-11 MED ORDER — DEXAMETHASONE SODIUM PHOSPHATE 4 MG/ML IJ SOLN
8.0000 mg | Freq: Once | INTRAMUSCULAR | Status: AC
  Administered 2012-03-11: 8 mg via INTRAVENOUS

## 2012-03-11 MED ORDER — SUCCINYLCHOLINE CHLORIDE 20 MG/ML IJ SOLN
INTRAMUSCULAR | Status: DC | PRN
  Administered 2012-03-11: 100 mg via INTRAVENOUS

## 2012-03-11 MED ORDER — ONDANSETRON HCL 4 MG/2ML IJ SOLN: 4.0000 mg | INTRAMUSCULAR | Status: DC | PRN

## 2012-03-11 MED ORDER — SUFENTANIL CITRATE 50 MCG/ML IV SOLN
INTRAVENOUS | Status: DC | PRN
  Administered 2012-03-11: 20 ug via INTRAVENOUS
  Administered 2012-03-11 (×2): 15 ug via INTRAVENOUS

## 2012-03-11 MED ORDER — SODIUM CHLORIDE 0.9 % IV SOLN: 250.0000 mL | INTRAVENOUS | Status: DC

## 2012-03-11 MED ORDER — ONDANSETRON HCL 4 MG/2ML IJ SOLN: 4.0000 mg | Freq: Once | INTRAMUSCULAR | Status: DC | PRN

## 2012-03-11 MED ORDER — SODIUM CHLORIDE 0.9 % IV SOLN
INTRAVENOUS | Status: DC
  Administered 2012-03-11: 22:00:00 via INTRAVENOUS

## 2012-03-11 MED ORDER — OXYCODONE-ACETAMINOPHEN 5-325 MG PO TABS
1.0000 | ORAL_TABLET | ORAL | Status: DC | PRN
  Administered 2012-03-11 – 2012-03-14 (×9): 2 via ORAL
  Filled 2012-03-11 (×9): qty 2

## 2012-03-11 MED ORDER — GLYCOPYRROLATE 0.2 MG/ML IJ SOLN
INTRAMUSCULAR | Status: DC | PRN
  Administered 2012-03-11: .6 mg via INTRAVENOUS

## 2012-03-11 MED ORDER — MENTHOL 3 MG MT LOZG
1.0000 | LOZENGE | OROMUCOSAL | Status: DC | PRN
  Administered 2012-03-12: 3 mg via ORAL
  Filled 2012-03-11: qty 9

## 2012-03-11 MED ORDER — DIAZEPAM 5 MG PO TABS
ORAL_TABLET | ORAL | Status: AC
  Filled 2012-03-11: qty 1

## 2012-03-11 MED ORDER — LIDOCAINE HCL (CARDIAC) 20 MG/ML IV SOLN
INTRAVENOUS | Status: DC | PRN
  Administered 2012-03-11: 100 mg via INTRAVENOUS

## 2012-03-11 MED ORDER — SODIUM CHLORIDE 0.9 % IJ SOLN
3.0000 mL | INTRAMUSCULAR | Status: DC | PRN
  Administered 2012-03-13: 3 mL via INTRAVENOUS

## 2012-03-11 MED ORDER — LINAGLIPTIN 5 MG PO TABS
5.0000 mg | ORAL_TABLET | Freq: Every day | ORAL | Status: DC
  Administered 2012-03-12 – 2012-03-14 (×3): 5 mg via ORAL
  Filled 2012-03-11 (×4): qty 1

## 2012-03-11 MED ORDER — ROCURONIUM BROMIDE 100 MG/10ML IV SOLN
INTRAVENOUS | Status: DC | PRN
  Administered 2012-03-11: 50 mg via INTRAVENOUS
  Administered 2012-03-11: 20 mg via INTRAVENOUS

## 2012-03-11 MED ORDER — DIAZEPAM 5 MG PO TABS
5.0000 mg | ORAL_TABLET | Freq: Four times a day (QID) | ORAL | Status: DC | PRN
  Administered 2012-03-11 – 2012-03-13 (×3): 5 mg via ORAL
  Filled 2012-03-11 (×2): qty 1

## 2012-03-11 MED ORDER — ZOLPIDEM TARTRATE 5 MG PO TABS: 10.0000 mg | ORAL_TABLET | Freq: Every evening | ORAL | Status: DC | PRN

## 2012-03-11 SURGICAL SUPPLY — 57 items
BANDAGE GAUZE ELAST BULKY 4 IN (GAUZE/BANDAGES/DRESSINGS) ×4 IMPLANT
BENZOIN TINCTURE PRP APPL 2/3 (GAUZE/BANDAGES/DRESSINGS) ×2 IMPLANT
BIT DRILL SPINE QC 12 (BIT) ×2 IMPLANT
BLADE ULTRA TIP 2M (BLADE) ×2 IMPLANT
BUR BARREL STRAIGHT FLUTE 4.0 (BURR) IMPLANT
BUR MATCHSTICK NEURO 3.0 LAGG (BURR) ×2 IMPLANT
CANISTER SUCTION 2500CC (MISCELLANEOUS) ×2 IMPLANT
CLOTH BEACON ORANGE TIMEOUT ST (SAFETY) ×2 IMPLANT
CONT SPEC 4OZ CLIKSEAL STRL BL (MISCELLANEOUS) ×2 IMPLANT
COVER MAYO STAND STRL (DRAPES) ×2 IMPLANT
DRAIN JACKSON PRATT 10MM FLAT (MISCELLANEOUS) ×2 IMPLANT
DRAPE C-ARM 42X72 X-RAY (DRAPES) ×4 IMPLANT
DRAPE LAPAROTOMY 100X72 PEDS (DRAPES) ×2 IMPLANT
DRAPE MICROSCOPE LEICA (MISCELLANEOUS) ×2 IMPLANT
DRAPE POUCH INSTRU U-SHP 10X18 (DRAPES) ×2 IMPLANT
DRAPE PROXIMA HALF (DRAPES) IMPLANT
DURAPREP 6ML APPLICATOR 50/CS (WOUND CARE) ×2 IMPLANT
ELECT REM PT RETURN 9FT ADLT (ELECTROSURGICAL) ×2
ELECTRODE REM PT RTRN 9FT ADLT (ELECTROSURGICAL) ×1 IMPLANT
EVACUATOR SILICONE 100CC (DRAIN) ×2 IMPLANT
GAUZE SPONGE 4X4 16PLY XRAY LF (GAUZE/BANDAGES/DRESSINGS) IMPLANT
GLOVE BIO SURGEON STRL SZ8 (GLOVE) ×2 IMPLANT
GLOVE BIOGEL M 8.0 STRL (GLOVE) ×2 IMPLANT
GLOVE BIOGEL PI IND STRL 8.5 (GLOVE) ×1 IMPLANT
GLOVE BIOGEL PI INDICATOR 8.5 (GLOVE) ×1
GLOVE ECLIPSE 6.5 STRL STRAW (GLOVE) ×2 IMPLANT
GLOVE EXAM NITRILE LRG STRL (GLOVE) IMPLANT
GLOVE EXAM NITRILE MD LF STRL (GLOVE) ×2 IMPLANT
GLOVE EXAM NITRILE XL STR (GLOVE) IMPLANT
GLOVE EXAM NITRILE XS STR PU (GLOVE) IMPLANT
GLOVE SURG SS PI 8.0 STRL IVOR (GLOVE) ×6 IMPLANT
GOWN BRE IMP SLV AUR LG STRL (GOWN DISPOSABLE) ×4 IMPLANT
GOWN BRE IMP SLV AUR XL STRL (GOWN DISPOSABLE) ×4 IMPLANT
GOWN STRL REIN 2XL LVL4 (GOWN DISPOSABLE) ×2 IMPLANT
HEAD HALTER (SOFTGOODS) ×2 IMPLANT
HEMOSTAT POWDER KIT SURGIFOAM (HEMOSTASIS) ×2 IMPLANT
KIT BASIN OR (CUSTOM PROCEDURE TRAY) ×2 IMPLANT
KIT ROOM TURNOVER OR (KITS) ×2 IMPLANT
NEEDLE SPNL 22GX3.5 QUINCKE BK (NEEDLE) ×4 IMPLANT
NS IRRIG 1000ML POUR BTL (IV SOLUTION) ×2 IMPLANT
PACK LAMINECTOMY NEURO (CUSTOM PROCEDURE TRAY) ×2 IMPLANT
PATTIES SURGICAL .5 X1 (DISPOSABLE) IMPLANT
PLATE ANT CERV XTEND 3 LV 48 (Plate) ×2 IMPLANT
PUTTY BONE GRAFT KIT 2.5ML (Bone Implant) ×2 IMPLANT
RUBBERBAND STERILE (MISCELLANEOUS) ×4 IMPLANT
SCREW XTD VAR 4.2 SELF TAP 12 (Screw) ×16 IMPLANT
SPACER ACDF SM LORDOTIC 7 (Spacer) ×6 IMPLANT
SPONGE GAUZE 4X4 12PLY (GAUZE/BANDAGES/DRESSINGS) ×2 IMPLANT
SPONGE INTESTINAL PEANUT (DISPOSABLE) ×4 IMPLANT
SPONGE SURGIFOAM ABS GEL 100 (HEMOSTASIS) ×2 IMPLANT
STRIP CLOSURE SKIN 1/2X4 (GAUZE/BANDAGES/DRESSINGS) ×2 IMPLANT
SUT VIC AB 3-0 SH 8-18 (SUTURE) ×4 IMPLANT
SYR 20ML ECCENTRIC (SYRINGE) ×2 IMPLANT
TAPE CLOTH SURG 4X10 WHT LF (GAUZE/BANDAGES/DRESSINGS) ×2 IMPLANT
TOWEL OR 17X24 6PK STRL BLUE (TOWEL DISPOSABLE) ×2 IMPLANT
TOWEL OR 17X26 10 PK STRL BLUE (TOWEL DISPOSABLE) ×2 IMPLANT
WATER STERILE IRR 1000ML POUR (IV SOLUTION) ×2 IMPLANT

## 2012-03-11 NOTE — Anesthesia Procedure Notes (Signed)
Procedure Name: Intubation Date/Time: 03/11/2012 2:12 PM Performed by: Coralee Rud Pre-anesthesia Checklist: Patient identified, Emergency Drugs available, Suction available, Patient being monitored and Timeout performed Patient Re-evaluated:Patient Re-evaluated prior to inductionOxygen Delivery Method: Circle system utilized Preoxygenation: Pre-oxygenation with 100% oxygen Intubation Type: IV induction Ventilation: Mask ventilation without difficulty Laryngoscope Size: Miller and 3 Grade View: Grade I Tube type: Oral Tube size: 8.0 mm Number of attempts: 1 Airway Equipment and Method: Stylet Placement Confirmation: ETT inserted through vocal cords under direct vision,  positive ETCO2,  CO2 detector and breath sounds checked- equal and bilateral Secured at: 23 cm Tube secured with: Tape Dental Injury: Teeth and Oropharynx as per pre-operative assessment

## 2012-03-11 NOTE — Progress Notes (Signed)
Op note [phone]

## 2012-03-11 NOTE — Preoperative (Signed)
Beta Blockers   Reason not to administer Beta Blockers:Not Applicable 

## 2012-03-11 NOTE — Anesthesia Preprocedure Evaluation (Addendum)
Anesthesia Evaluation  Patient identified by MRN, date of birth, ID band Patient awake    Reviewed: Allergy & Precautions, H&P , NPO status , Patient's Chart, lab work & pertinent test results  Airway Mallampati: I TM Distance: >3 FB Neck ROM: full    Dental   Pulmonary sleep apnea ,          Cardiovascular hypertension, Rhythm:regular Rate:Normal     Neuro/Psych    GI/Hepatic   Endo/Other  diabetes, Poorly Controlled, Type 2, Oral Hypoglycemic Agents  Renal/GU      Musculoskeletal   Abdominal   Peds  Hematology   Anesthesia Other Findings   Reproductive/Obstetrics                          Anesthesia Physical Anesthesia Plan  ASA: III  Anesthesia Plan: General   Post-op Pain Management:    Induction: Intravenous  Airway Management Planned: Oral ETT  Additional Equipment:   Intra-op Plan:   Post-operative Plan: Extubation in OR  Informed Consent: I have reviewed the patients History and Physical, chart, labs and discussed the procedure including the risks, benefits and alternatives for the proposed anesthesia with the patient or authorized representative who has indicated his/her understanding and acceptance.     Plan Discussed with: CRNA, Anesthesiologist and Surgeon  Anesthesia Plan Comments:         Anesthesia Quick Evaluation

## 2012-03-11 NOTE — Anesthesia Postprocedure Evaluation (Signed)
  Anesthesia Post-op Note  Patient: Albert Benjamin  Procedure(s) Performed: Procedure(s) with comments: ANTERIOR CERVICAL DECOMPRESSION/DISCECTOMY FUSION 3 LEVELS (N/A) - Anterior cervical decompression/fusion Cervical four-five, Cervical five-six, Cervical six-seven  Patient Location: PACU  Anesthesia Type:General  Level of Consciousness: awake  Airway and Oxygen Therapy: Patient Spontanous Breathing  Post-op Pain: mild  Post-op Assessment: Post-op Vital signs reviewed  Post-op Vital Signs: Reviewed  Complications: No apparent anesthesia complications

## 2012-03-11 NOTE — Transfer of Care (Signed)
Immediate Anesthesia Transfer of Care Note  Patient: Albert Benjamin  Procedure(s) Performed: Procedure(s) with comments: ANTERIOR CERVICAL DECOMPRESSION/DISCECTOMY FUSION 3 LEVELS (N/A) - Anterior cervical decompression/fusion Cervical four-five, Cervical five-six, Cervical six-seven  Patient Location: PACU  Anesthesia Type:General  Level of Consciousness: awake, alert  and sedated  Airway & Oxygen Therapy: Patient Spontanous Breathing and Patient connected to nasal cannula oxygen  Post-op Assessment: Report given to PACU RN, Post -op Vital signs reviewed and stable, Patient moving all extremities and Patient moving all extremities X 4  Post vital signs: Reviewed and stable  Complications: No apparent anesthesia complications

## 2012-03-11 NOTE — H&P (Signed)
Albert Benjamin is an 62 y.o. male.   Chief Complaint: NECK PAIN HPI: patient seen by me 2 weeks ago with neck pain with radiation to the right upper extremity associated with severe weakness of the deltoid and biceps. Had surgery of the shoulder with no improvement. Mri of the cervical spine showed severe stenosis  Past Medical History  Diagnosis Date  . Depression   . Diabetes mellitus   . Arthritis   . Wears hearing aid     both ears  . Wears glasses   . Sleep apnea     does not use CPAP  . Obstructive sleep apnea on CPAP     Past Surgical History  Procedure Laterality Date  . Shoulder arthroscopy  [id number]    [id number]-dsc  . Knee arthroscopy  2007    left, right 09/2011  . Colonoscopy    . Eye surgery      catarct bil    No family history on file. Social History:  reports that he has never smoked. He does not have any smokeless tobacco history on file. He reports that he does not drink alcohol or use illicit drugs.  Allergies: No Known Allergies  No prescriptions prior to admission    No results found for this or any previous visit (from the past 48 hour(s)). No results found.  Review of Systems  Constitutional: Negative.   HENT: Positive for neck pain.   Respiratory: Negative.   Cardiovascular: Negative.   Gastrointestinal: Negative.   Genitourinary: Negative.   Skin: Negative.   Neurological: Positive for sensory change and focal weakness.  Endo/Heme/Allergies:       DM  Psychiatric/Behavioral: Negative.     There were no vitals taken for this visit. Physical Exam hent, nl. Neck, pain with movement. Lungs, mild rales. Cv, nl. Abdomen, sof. Extremities, scar right shoulder. NEURO, 1/5 right deltoid,biceps , wrist extensor. 4/5 triceps. Sensory, nl. Mri severe stenosis cervical 45, 56 and 67. Bilateral foraminal stenosis at c34  Assessment/Plan Decompression and fusion from cervical 3 to 7. Patient aware of risks and benefits  Michelle Vanhise M 03/11/2012,  8:50 AM

## 2012-03-12 ENCOUNTER — Encounter (HOSPITAL_COMMUNITY): Payer: Self-pay | Admitting: Neurosurgery

## 2012-03-12 LAB — GLUCOSE, CAPILLARY
Glucose-Capillary: 247 mg/dL — ABNORMAL HIGH (ref 70–99)
Glucose-Capillary: 275 mg/dL — ABNORMAL HIGH (ref 70–99)
Glucose-Capillary: 278 mg/dL — ABNORMAL HIGH (ref 70–99)
Glucose-Capillary: 264 mg/dL — ABNORMAL HIGH (ref 70–99)
Glucose-Capillary: 215 mg/dL — ABNORMAL HIGH (ref 70–99)

## 2012-03-12 MED ORDER — INSULIN ASPART 100 UNIT/ML ~~LOC~~ SOLN
0.0000 [IU] | Freq: Every day | SUBCUTANEOUS | Status: DC
  Administered 2012-03-12: 2 [IU] via SUBCUTANEOUS
  Administered 2012-03-13: 3 [IU] via SUBCUTANEOUS

## 2012-03-12 MED ORDER — INSULIN ASPART 100 UNIT/ML ~~LOC~~ SOLN
0.0000 [IU] | Freq: Three times a day (TID) | SUBCUTANEOUS | Status: DC
  Administered 2012-03-12 (×2): 11 [IU] via SUBCUTANEOUS
  Administered 2012-03-13 (×2): 7 [IU] via SUBCUTANEOUS
  Administered 2012-03-13 – 2012-03-14 (×2): 4 [IU] via SUBCUTANEOUS

## 2012-03-12 NOTE — Op Note (Signed)
[name], Benjamin NO.:  [id number]  MEDICAL RECORD NO.:  [id number]  LOCATION:  [id number]                        FACILITY:  [facility]  PHYSICIAN:  Hilda Lias, M.D.   DATE OF BIRTH:  [birth year]  DATE OF PROCEDURE: DATE OF DISCHARGE:                              OPERATIVE REPORT   PREOPERATIVE DIAGNOSIS:  Cervical stenosis, cervical 4-5, 5-6, 6-7 with chronic right-sided radiculopathy with severe weakness of the deltoid and biceps and wrist extension.  POSTOPERATIVE DIAGNOSIS:  Cervical stenosis, cervical 4-5, 5-6, 6-7 with chronic right-sided radiculopathy with severe weakness of the deltoid and biceps and wrist extension.  PROCEDURE:  Anterior 4-5, 5-6, 6-7 diskectomy, decompression the spinal cord, bilateral foraminotomy, interbody fusion with cage, plate, microscope.  SURGEON:  Hilda Lias, M.D.  ASSISTANT:  Coletta Memos, M.D.  CLINICAL HISTORY:  [name] is a 62 year old gentleman, morbidly obese, who was seen in my office because severe weakness of the right arm.  The patient previously had surgery without any improvement.  Cervical MRI shows severe stenosis at the level 4-5, 5-6, and 67 right side worse than the left.  At the beginning, he was scheduled to have decompression at the level C3-4, but review on the MRI showed that the foramen was open.  I reviewed this with the radiologist.  Because of that, we dropped the C3-C4 from the surgical procedure.  The patient knew the risk with the surgery.  Also the most important he knew that the surgery was to prevent from getting worse and hopefully will get better with a severe weakness of the right arm.  PROCEDURE:  The patient was taken to the OR, and after intubation, we had to do 10-pound traction to be able to see the anterior part of the neck.  The patient had quite a big shoulders and short neck.  Then, the skin was cleaned with DuraPrep and drapes were applied.  Longitudinal incision was  made through the skin and subcutaneous tissue, straight down to the paracervical area.  The patient has a large osteophyte and they were removed.  The 1st x-ray showed that indeed we were right at the level of 4-5.  From then on, the anterior osteophyte was removed. We started at the level of 4-5.  The patient had quite a bit of narrowing of the disk space.  We had to drill all the way posteriorly. The posterior ligament was calcified.  It was opened in the midline and decompression of the spinal cord as well as the foramen was accomplished.  At the level of 5-6, most of the compression from the midline to the right side with a large osteophyte.  We drilled all the way laterally until we were able to decompress the C6 nerve root.  This procedure was done bilaterally at the level of 6-7 _with_________ quite a bit of narrowing the foramen and calcification of the posterior ligament.  Decompression of the spinal cord as well as the C7 nerve root was achieved.  Then, the endplate were removed.  We introduced 3 cages, 7 mm height, lordotic with autograft and bone extender inside.  This was followed with a plate.  We attempted to read the  repeat the x-ray, but we were able only to see the __top________ C4.  Then using __________ screws, we kept the plate in place, the hemostasis was accomplished.  Nevertheless, we left a drain in the operative site. From then on, the wound was closed with Vicryl and Steri-Strips.          ______________________________ Hilda Lias, M.D.     EB/MEDQ  D:  03/11/2012  T:  03/12/2012  Job:  [id number]

## 2012-03-12 NOTE — Progress Notes (Signed)
PT Cancellation Note  Patient Details Name: Albert Benjamin MRN: [id number] DOB: 11-04-1950   Cancelled Treatment:    Reason Eval/Treat Not Completed: Other (comment) (pt. independent in mobility per conversation with OT, Tory Emerald.  She has seen pt. And states he is moving independently and has no PT needs.  Will sign off.   Ferman Hamming 03/12/2012, 12:12 PM Weldon Picking PT [facility] [phone] [place] [phone]

## 2012-03-12 NOTE — Progress Notes (Signed)
Inpatient Diabetes Program Recommendations  AACE/ADA: New Consensus Statement on Inpatient Glycemic Control (2013)  Target Ranges:  Prepandial:   less than 140 mg/dL      Peak postprandial:   less than 180 mg/dL (1-2 hours)      Critically ill patients:  140 - 180 mg/dL  Results for BAZIL, DHANANI (MRN [id number]) as of 03/12/2012 12:09  Ref. Range 03/11/2012 21:38 03/12/2012 06:32 03/12/2012 11:43  Glucose-Capillary Latest Range: 70-99 mg/dL 147 (H) 829 (H) 562 (H)   Inpatient Diabetes Program Recommendations Insulin - Basal: Add Lantus 20 units at HS during steroid therapy HgbA1C: order to assess prehospital glucose control Thank you  Piedad Climes BSN, RN,CDE Inpatient Diabetes Coordinator [phone] (team pager)

## 2012-03-12 NOTE — Evaluation (Signed)
Occupational Therapy Evaluation Patient Details Name: Albert Benjamin MRN: [id number] DOB: 1950-07-06 Today's Date: 03/12/2012 Time: 8469-6295 OT Time Calculation (min): 33 min  OT Assessment / Plan / Recommendation Clinical Impression  Pt is a 62 yo male admitted for C3-C7 ACD.  Pt with h/o 2 shoulder surgeries to the RUE and now was at chiropractor 3 weeks ago for an adjustment when he felt and sharp shooting pains go down his arm and lost deltoid and bicep mobility.  Pt with deficits listed below.  Pt safe to d/c home from therapy standpoint and get OPOT as ordered by Dr. Jeral Fruit.    OT Assessment  Patient needs continued OT Services    Follow Up Recommendations  Outpatient OT;Other (comment) (as ordered by Dr. Jeral Fruit following neck surgery.)    Barriers to Discharge None    Equipment Recommendations  None recommended by OT    Recommendations for Other Services    Frequency  Min 2X/week    Precautions / Restrictions Precautions Precautions: Cervical Required Braces or Orthoses: Cervical Brace Cervical Brace: Hard collar;Applied in sitting position Restrictions Weight Bearing Restrictions: No   Pertinent Vitals/Pain Pt with 1/10 pain    ADL  Eating/Feeding: Performed;Independent;Other (comment) (currently uses L hand) Where Assessed - Eating/Feeding: Chair Grooming: Performed;Wash/dry hands;Teeth care;Modified independent Where Assessed - Grooming: Unsupported standing Upper Body Bathing: Simulated;Set up Where Assessed - Upper Body Bathing: Unsupported sitting Lower Body Bathing: Simulated;Modified independent Where Assessed - Lower Body Bathing: Unsupported sit to stand Upper Body Dressing: Performed;Minimal assistance Where Assessed - Upper Body Dressing: Unsupported sitting Lower Body Dressing: Performed;Supervision/safety Where Assessed - Lower Body Dressing: Unsupported sit to stand Toilet Transfer: Performed;Supervision/safety Toilet Transfer Method: Other  (comment) (ambulated) Toilet Transfer Equipment: Comfort height toilet;Grab bars Toileting - Clothing Manipulation and Hygiene: Performed;Supervision/safety Where Assessed - Engineer, mining and Hygiene: Standing Tub/Shower Transfer: Performed;Modified independent Psychologist, educational: Walk in shower Equipment Used:  (neck brace) Transfers/Ambulation Related to ADLs: pt walked in hallway with no assist. ADL Comments: pt has had weakness in RUE for 3 weeks not and has learned to adapt and be independent.  No assist necessary.    OT Diagnosis: Generalized weakness  OT Problem List: Decreased strength;Decreased range of motion;Decreased coordination;Pain;Impaired UE functional use OT Treatment Interventions: Therapeutic exercise   OT Goals Acute Rehab OT Goals OT Goal Formulation: With patient/family Time For Goal Achievement: 03/19/12 Potential to Achieve Goals: Good Arm Goals Additional Arm Goal #1: Pt will be I with RUE exercises including SROM to 90 degrees of R shoulder, bicep and tricep exercises with manual resistance and wall walks with RUE in flexion and abduction to 90 as able. Arm Goal: Additional Goal #1 - Progress: Goal set today  Visit Information  Last OT Received On: 03/12/12 Assistance Needed: +1    Subjective Data  Subjective: "I feel good." Patient Stated Goal: to get home and get movement back in this dead arm.   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  PROM only tested to 100 degrees of flexion due to recent surgery. RUE Sensation: Deficits RUE Sensation Deficits: pt wtih tingling down R arm and into hand. RUE Coordination: Deficits RUE Coordination Deficits: mild coordination deficits due to weakness in RUE Left Upper Extremity Assessment LUE ROM/Strength/Tone: Within functional levels LUE Sensation: WFL - Light Touch LUE Coordination: WFL - gross/fine motor Trunk Assessment Trunk Assessment: Normal     Mobility Transfers Transfers: Sit to Stand;Stand to Sit Sit to Stand: 7: Independent Stand to Sit: 7: Independent Details for Transfer Assistance: independent     Exercise General Exercises - Upper Extremity Shoulder Flexion: AAROM;10 reps;Seated;Other (comment) (to 90b degrees) Shoulder ABduction: AAROM;10 reps;Standing (wall walks to 90 degrees) Elbow Flexion: AAROM;10 reps;Seated;Other (comment) (w therapist holding onto elbow and wrist to assist.) Elbow Extension: AROM;10 reps;Seated;Other (comment) (w manual resistance.) Wrist Flexion: AROM;10 reps;Seated;Other (comment) (w resistance) Wrist Extension: AROM;10 reps;Seated;Other (comment) (w manual resistance.) Digit Composite Flexion: AROM;10 reps;Seated Composite Extension: AROM;10 reps;Seated   Balance Balance Balance Assessed: Yes Dynamic Standing Balance Dynamic Standing - Balance Support: No upper extremity supported;During functional  activity Dynamic Standing - Level of Assistance: 7: Independent Dynamic Standing - Balance Activities: Reaching for objects;Lateral lean/weight shifting;Forward lean/weight shifting High Level Balance High Level Balance Activites: Side stepping   End of Session OT - End of Session Equipment Utilized During Treatment: Cervical collar Activity Tolerance: Patient tolerated treatment well Patient left: in chair;with call bell/phone within reach;with family/visitor present Nurse Communication: Mobility status  GO     [name] 03/12/2012, 11:14 AM [phone]

## 2012-03-12 NOTE — Progress Notes (Signed)
Patient ID: Albert Benjamin, male   DOB: 04-09-1950, 62 y.o.   MRN: [id number] VOICE OK. SOME DRAINAGE. LUE NORMAL. TINGLING right hand. Triceps 4/5. Severe weakness deltoids and biceps as preop. Pt to see.

## 2012-03-13 LAB — GLUCOSE, CAPILLARY
Glucose-Capillary: 168 mg/dL — ABNORMAL HIGH (ref 70–99)
Glucose-Capillary: 237 mg/dL — ABNORMAL HIGH (ref 70–99)
Glucose-Capillary: 220 mg/dL — ABNORMAL HIGH (ref 70–99)
Glucose-Capillary: 266 mg/dL — ABNORMAL HIGH (ref 70–99)

## 2012-03-13 MED ORDER — BISACODYL 10 MG RE SUPP: 10.0000 mg | Freq: Every day | RECTAL | Status: DC | PRN

## 2012-03-13 NOTE — Progress Notes (Signed)
Patient ID: POLK MINOR, male   DOB: [birth year], 62 y.o.   MRN: [id number] Decrease of drainage. Wound dry. He feels a little stronger. Voice ok

## 2012-03-13 NOTE — Care Management Note (Unsigned)
    Page 1 of 1   03/13/2012     3:34:14 PM   CARE MANAGEMENT NOTE 03/13/2012  Patient:  Albert Benjamin, Albert Benjamin   Account Number:  [id number]  Date Initiated:  03/12/2012  Documentation initiated by:  [facility]  Subjective/Objective Assessment:   admitted postop ACDF C4-7     Action/Plan:   PT/OT evals-recommending outpatient OT   Anticipated DC Date:  03/15/2012   Anticipated DC Plan:  HOME/SELF CARE      DC Planning Services  CM consult      Choice offered to / List presented to:             Status of service:  In process, will continue to follow Medicare Important Message given?   (If response is "NO", the following Medicare IM given date fields will be blank) Date Medicare IM given:   Date Additional Medicare IM given:    Discharge Disposition:    Per UR Regulation:  Reviewed for med. necessity/level of care/duration of stay  If discussed at Long Length of Stay Meetings, dates discussed:    Comments:  03/13/12 Spoke with patient about outpatient OT, he has had tx with [facility] in [place]. Tried to contact them but phone # has been disconnected. Left voicemail at [facility] location in [place] to check phone number for [place] location. Updated patient and will follow up. Verified patient's home phone numbers. Jacquelynn Cree RN, BSN, CCM

## 2012-03-13 NOTE — Clinical Social Work Note (Signed)
Clincial Social Work   CSW received cosnult for SNF. CSW reviewed chart and discussed pt with RN during progression. Awaiting PT/Ot evals for discharge recommendations. Evals  are needed for insurance prior auth for SNF placement. CSW will assess for SNF, if appropriate. Please call with any urgent needs. CSW continue to follow.   Dede Query, MSW, Theresia Majors [phone]

## 2012-03-13 NOTE — Progress Notes (Signed)
Occupational Therapy Treatment and Discharge Summary Patient Details Name: Albert Benjamin MRN: [id number] DOB: 31-Jul-1950 Today's Date: 03/13/2012 Time: 4401-0272 OT Time Calculation (min): 16 min  OT Assessment / Plan / Recommendation Comments on Treatment Session Pt and wife are I with exercises to do at home for RUE.  Progress therapy as Dr. Jeral Fruit orders.    Follow Up Recommendations  Outpatient OT;Other (comment)    Barriers to Discharge       Equipment Recommendations  None recommended by OT    Recommendations for Other Services    Frequency Other (comment) (dc ot)   Plan Discharge plan remains appropriate    Precautions / Restrictions Precautions Precautions: Cervical Required Braces or Orthoses: Cervical Brace Cervical Brace: Hard collar;Applied in sitting position Restrictions Weight Bearing Restrictions: No   Pertinent Vitals/Pain Pt w no c/o pain this am.    ADL       OT Diagnosis:    OT Problem List:   OT Treatment Interventions:     OT Goals Acute Rehab OT Goals OT Goal Formulation: With patient/family Time For Goal Achievement: 03/19/12 Potential to Achieve Goals: Good Arm Goals Additional Arm Goal #1: Pt will be I with RUE exercises including SROM to 90 degrees of R shoulder, bicep and tricep exercises with manual resistance and wall walks with RUE in flexion and abduction to 90 as able. Arm Goal: Additional Goal #1 - Progress: Met  Visit Information  Last OT Received On: 03/13/12 Assistance Needed: +1    Subjective Data      Prior Functioning       Cognition       Mobility       Exercises  General Exercises - Upper Extremity Shoulder Flexion: AAROM;10 reps;Standing Shoulder Extension: AROM;10 reps;Standing Elbow Flexion: AAROM;10 reps;Seated Elbow Extension: AROM;10 reps;Seated Wrist Flexion: AROM;10 reps;Seated;Other (comment) Wrist Extension: AROM;10 reps;Seated;Other (comment) Digit Composite Flexion: AROM;10  reps;Seated Composite Extension: AROM;10 reps;Seated   Balance     End of Session OT - End of Session Equipment Utilized During Treatment: Cervical collar Activity Tolerance: Patient tolerated treatment well Patient left: in chair;with call bell/phone within reach;with family/visitor present Nurse Communication: Mobility status  GO     Albert Benjamin 03/13/2012, 9:16 AM

## 2012-03-14 LAB — GLUCOSE, CAPILLARY: Glucose-Capillary: 200 mg/dL — ABNORMAL HIGH (ref 70–99)

## 2012-03-14 NOTE — Progress Notes (Signed)
Pt discharge orders received, pt stable.  To be discharged home with out patient OT. Case manager informed pt on Friday she would follow up with the out patient OT needs and facility on Monday.  IV removed, no equipment needed, follow up and medication information reviewed with pt and wife. Bradlee Bridgers, [place] Marie, RN

## 2012-03-14 NOTE — Discharge Summary (Signed)
Physician Discharge Summary  Patient ID: Albert Benjamin MRN: [id number] DOB/AGE: 03/31/1950 62 y.o.  Admit date: 03/11/2012 Discharge date: 03/14/2012  Admission Diagnoses:cervical stenosis  Discharge Diagnoses: same   Discharged Condition: stable  Hospital Course: surgery  Consults: none  Significant Diagnostic Studies: mri  Treatments: cervical fision  Discharge Exam: Blood pressure 149/93, pulse 68, temperature 97.7 F (36.5 C), temperature source Oral, resp. rate 18, height 5\' 9"  (1.753 m), weight 114.306 kg (252 lb), SpO2 97.00%. Severe right weakness as preop but getting better  Disposition: home. To see me in 3 to 4 weeks     Medication List    ASK your doctor about these medications       cetirizine 10 MG tablet  Commonly known as:  ZYRTEC  Take 10 mg by mouth daily.     citalopram 40 MG tablet  Commonly known as:  CELEXA  Take 40 mg by mouth daily.     glipiZIDE 10 MG tablet  Commonly known as:  GLUCOTROL  Take 10 mg by mouth 2 (two) times daily before a meal.     methocarbamol 500 MG tablet  Commonly known as:  ROBAXIN  Take 500 mg by mouth every 6 (six) hours as needed (muscle spasms).     pioglitazone 30 MG tablet  Commonly known as:  ACTOS  Take 30 mg by mouth daily.     saxagliptin HCl 2.5 MG Tabs tablet  Commonly known as:  ONGLYZA  Take by mouth daily.         Signed: Karn Cassis 03/14/2012, 10:23 AM

## 2014-07-13 DIAGNOSIS — E1165 Type 2 diabetes mellitus with hyperglycemia: Secondary | ICD-10-CM | POA: Diagnosis not present

## 2014-07-14 DIAGNOSIS — E1165 Type 2 diabetes mellitus with hyperglycemia: Secondary | ICD-10-CM | POA: Diagnosis not present

## 2014-10-18 DIAGNOSIS — I10 Essential (primary) hypertension: Secondary | ICD-10-CM | POA: Diagnosis not present

## 2014-10-18 DIAGNOSIS — E1165 Type 2 diabetes mellitus with hyperglycemia: Secondary | ICD-10-CM | POA: Diagnosis not present

## 2014-10-18 DIAGNOSIS — J302 Other seasonal allergic rhinitis: Secondary | ICD-10-CM | POA: Diagnosis not present

## 2014-12-09 DIAGNOSIS — E1165 Type 2 diabetes mellitus with hyperglycemia: Secondary | ICD-10-CM | POA: Diagnosis not present

## 2014-12-09 DIAGNOSIS — Z23 Encounter for immunization: Secondary | ICD-10-CM | POA: Diagnosis not present

## 2015-01-03 DIAGNOSIS — E119 Type 2 diabetes mellitus without complications: Secondary | ICD-10-CM | POA: Diagnosis not present

## 2015-01-03 DIAGNOSIS — H35352 Cystoid macular degeneration, left eye: Secondary | ICD-10-CM | POA: Diagnosis not present

## 2015-01-12 DIAGNOSIS — E7800 Pure hypercholesterolemia, unspecified: Secondary | ICD-10-CM | POA: Diagnosis not present

## 2015-01-12 DIAGNOSIS — J302 Other seasonal allergic rhinitis: Secondary | ICD-10-CM | POA: Diagnosis not present

## 2015-01-12 DIAGNOSIS — Z1212 Encounter for screening for malignant neoplasm of rectum: Secondary | ICD-10-CM | POA: Diagnosis not present

## 2015-01-12 DIAGNOSIS — Z1389 Encounter for screening for other disorder: Secondary | ICD-10-CM | POA: Diagnosis not present

## 2015-01-12 DIAGNOSIS — I10 Essential (primary) hypertension: Secondary | ICD-10-CM | POA: Diagnosis not present

## 2015-01-12 DIAGNOSIS — F411 Generalized anxiety disorder: Secondary | ICD-10-CM | POA: Diagnosis not present

## 2015-01-12 DIAGNOSIS — M4802 Spinal stenosis, cervical region: Secondary | ICD-10-CM | POA: Diagnosis not present

## 2015-01-12 DIAGNOSIS — E119 Type 2 diabetes mellitus without complications: Secondary | ICD-10-CM | POA: Diagnosis not present

## 2015-02-09 DIAGNOSIS — E870 Hyperosmolality and hypernatremia: Secondary | ICD-10-CM | POA: Diagnosis not present

## 2015-04-20 DIAGNOSIS — E1165 Type 2 diabetes mellitus with hyperglycemia: Secondary | ICD-10-CM | POA: Diagnosis not present

## 2015-08-01 DIAGNOSIS — H6121 Impacted cerumen, right ear: Secondary | ICD-10-CM | POA: Diagnosis not present

## 2015-08-01 DIAGNOSIS — E1165 Type 2 diabetes mellitus with hyperglycemia: Secondary | ICD-10-CM | POA: Diagnosis not present

## 2015-09-26 DIAGNOSIS — J310 Chronic rhinitis: Secondary | ICD-10-CM | POA: Diagnosis not present

## 2015-09-26 DIAGNOSIS — Z6834 Body mass index (BMI) 34.0-34.9, adult: Secondary | ICD-10-CM | POA: Diagnosis not present

## 2015-09-26 DIAGNOSIS — H938X9 Other specified disorders of ear, unspecified ear: Secondary | ICD-10-CM | POA: Diagnosis not present

## 2015-11-01 DIAGNOSIS — E119 Type 2 diabetes mellitus without complications: Secondary | ICD-10-CM | POA: Diagnosis not present

## 2015-11-01 DIAGNOSIS — J0100 Acute maxillary sinusitis, unspecified: Secondary | ICD-10-CM | POA: Diagnosis not present

## 2015-11-01 DIAGNOSIS — I10 Essential (primary) hypertension: Secondary | ICD-10-CM | POA: Diagnosis not present

## 2015-11-11 DIAGNOSIS — J019 Acute sinusitis, unspecified: Secondary | ICD-10-CM | POA: Diagnosis not present

## 2015-11-11 DIAGNOSIS — J209 Acute bronchitis, unspecified: Secondary | ICD-10-CM | POA: Diagnosis not present

## 2015-12-17 DIAGNOSIS — J069 Acute upper respiratory infection, unspecified: Secondary | ICD-10-CM | POA: Diagnosis not present

## 2015-12-17 DIAGNOSIS — R0600 Dyspnea, unspecified: Secondary | ICD-10-CM | POA: Diagnosis not present

## 2016-02-01 DIAGNOSIS — E119 Type 2 diabetes mellitus without complications: Secondary | ICD-10-CM | POA: Diagnosis not present

## 2016-04-19 DIAGNOSIS — Z1389 Encounter for screening for other disorder: Secondary | ICD-10-CM | POA: Diagnosis not present

## 2016-04-19 DIAGNOSIS — J302 Other seasonal allergic rhinitis: Secondary | ICD-10-CM | POA: Diagnosis not present

## 2016-04-19 DIAGNOSIS — Z1212 Encounter for screening for malignant neoplasm of rectum: Secondary | ICD-10-CM | POA: Diagnosis not present

## 2016-04-19 DIAGNOSIS — E119 Type 2 diabetes mellitus without complications: Secondary | ICD-10-CM | POA: Diagnosis not present

## 2016-04-19 DIAGNOSIS — M4802 Spinal stenosis, cervical region: Secondary | ICD-10-CM | POA: Diagnosis not present

## 2016-04-19 DIAGNOSIS — F411 Generalized anxiety disorder: Secondary | ICD-10-CM | POA: Diagnosis not present

## 2016-04-19 DIAGNOSIS — Z0000 Encounter for general adult medical examination without abnormal findings: Secondary | ICD-10-CM | POA: Diagnosis not present

## 2016-04-19 DIAGNOSIS — E7800 Pure hypercholesterolemia, unspecified: Secondary | ICD-10-CM | POA: Diagnosis not present

## 2016-04-19 DIAGNOSIS — I10 Essential (primary) hypertension: Secondary | ICD-10-CM | POA: Diagnosis not present

## 2016-04-19 DIAGNOSIS — Z6837 Body mass index (BMI) 37.0-37.9, adult: Secondary | ICD-10-CM | POA: Diagnosis not present

## 2016-07-22 DIAGNOSIS — E7800 Pure hypercholesterolemia, unspecified: Secondary | ICD-10-CM | POA: Diagnosis not present

## 2016-07-22 DIAGNOSIS — E119 Type 2 diabetes mellitus without complications: Secondary | ICD-10-CM | POA: Diagnosis not present

## 2016-07-22 DIAGNOSIS — I10 Essential (primary) hypertension: Secondary | ICD-10-CM | POA: Diagnosis not present

## 2016-10-23 DIAGNOSIS — M79605 Pain in left leg: Secondary | ICD-10-CM | POA: Diagnosis not present

## 2016-10-23 DIAGNOSIS — M5136 Other intervertebral disc degeneration, lumbar region: Secondary | ICD-10-CM | POA: Diagnosis not present

## 2016-10-23 DIAGNOSIS — I10 Essential (primary) hypertension: Secondary | ICD-10-CM | POA: Diagnosis not present

## 2016-10-23 DIAGNOSIS — M545 Low back pain: Secondary | ICD-10-CM | POA: Diagnosis not present

## 2016-10-23 DIAGNOSIS — E1165 Type 2 diabetes mellitus with hyperglycemia: Secondary | ICD-10-CM | POA: Diagnosis not present

## 2016-10-29 DIAGNOSIS — Z23 Encounter for immunization: Secondary | ICD-10-CM | POA: Diagnosis not present

## 2016-11-05 DIAGNOSIS — M79605 Pain in left leg: Secondary | ICD-10-CM | POA: Diagnosis not present

## 2016-11-05 DIAGNOSIS — M545 Low back pain: Secondary | ICD-10-CM | POA: Diagnosis not present

## 2016-12-05 DIAGNOSIS — M4716 Other spondylosis with myelopathy, lumbar region: Secondary | ICD-10-CM | POA: Diagnosis not present

## 2016-12-05 DIAGNOSIS — M545 Low back pain: Secondary | ICD-10-CM | POA: Diagnosis not present

## 2016-12-27 DIAGNOSIS — J0100 Acute maxillary sinusitis, unspecified: Secondary | ICD-10-CM | POA: Diagnosis not present

## 2017-01-02 DIAGNOSIS — M5417 Radiculopathy, lumbosacral region: Secondary | ICD-10-CM | POA: Diagnosis not present

## 2017-01-02 DIAGNOSIS — M545 Low back pain: Secondary | ICD-10-CM | POA: Diagnosis not present

## 2017-01-02 DIAGNOSIS — M6281 Muscle weakness (generalized): Secondary | ICD-10-CM | POA: Diagnosis not present

## 2017-01-02 DIAGNOSIS — R2689 Other abnormalities of gait and mobility: Secondary | ICD-10-CM | POA: Diagnosis not present

## 2017-01-06 DIAGNOSIS — R2689 Other abnormalities of gait and mobility: Secondary | ICD-10-CM | POA: Diagnosis not present

## 2017-01-06 DIAGNOSIS — M545 Low back pain: Secondary | ICD-10-CM | POA: Diagnosis not present

## 2017-01-06 DIAGNOSIS — M5417 Radiculopathy, lumbosacral region: Secondary | ICD-10-CM | POA: Diagnosis not present

## 2017-01-06 DIAGNOSIS — M6281 Muscle weakness (generalized): Secondary | ICD-10-CM | POA: Diagnosis not present

## 2017-01-09 DIAGNOSIS — M545 Low back pain: Secondary | ICD-10-CM | POA: Diagnosis not present

## 2017-01-09 DIAGNOSIS — R2689 Other abnormalities of gait and mobility: Secondary | ICD-10-CM | POA: Diagnosis not present

## 2017-01-09 DIAGNOSIS — M5417 Radiculopathy, lumbosacral region: Secondary | ICD-10-CM | POA: Diagnosis not present

## 2017-01-09 DIAGNOSIS — M6281 Muscle weakness (generalized): Secondary | ICD-10-CM | POA: Diagnosis not present

## 2017-01-14 DIAGNOSIS — M5417 Radiculopathy, lumbosacral region: Secondary | ICD-10-CM | POA: Diagnosis not present

## 2017-01-14 DIAGNOSIS — M545 Low back pain: Secondary | ICD-10-CM | POA: Diagnosis not present

## 2017-01-14 DIAGNOSIS — R2689 Other abnormalities of gait and mobility: Secondary | ICD-10-CM | POA: Diagnosis not present

## 2017-01-14 DIAGNOSIS — M6281 Muscle weakness (generalized): Secondary | ICD-10-CM | POA: Diagnosis not present

## 2017-01-16 DIAGNOSIS — M5417 Radiculopathy, lumbosacral region: Secondary | ICD-10-CM | POA: Diagnosis not present

## 2017-01-16 DIAGNOSIS — M545 Low back pain: Secondary | ICD-10-CM | POA: Diagnosis not present

## 2017-01-16 DIAGNOSIS — M6281 Muscle weakness (generalized): Secondary | ICD-10-CM | POA: Diagnosis not present

## 2017-01-16 DIAGNOSIS — R2689 Other abnormalities of gait and mobility: Secondary | ICD-10-CM | POA: Diagnosis not present

## 2017-01-21 DIAGNOSIS — M6281 Muscle weakness (generalized): Secondary | ICD-10-CM | POA: Diagnosis not present

## 2017-01-21 DIAGNOSIS — M5417 Radiculopathy, lumbosacral region: Secondary | ICD-10-CM | POA: Diagnosis not present

## 2017-01-21 DIAGNOSIS — M545 Low back pain: Secondary | ICD-10-CM | POA: Diagnosis not present

## 2017-01-21 DIAGNOSIS — R2689 Other abnormalities of gait and mobility: Secondary | ICD-10-CM | POA: Diagnosis not present

## 2017-01-23 DIAGNOSIS — E1165 Type 2 diabetes mellitus with hyperglycemia: Secondary | ICD-10-CM | POA: Diagnosis not present

## 2017-01-23 DIAGNOSIS — I10 Essential (primary) hypertension: Secondary | ICD-10-CM | POA: Diagnosis not present

## 2017-01-23 DIAGNOSIS — E7800 Pure hypercholesterolemia, unspecified: Secondary | ICD-10-CM | POA: Diagnosis not present

## 2017-01-28 DIAGNOSIS — M545 Low back pain: Secondary | ICD-10-CM | POA: Diagnosis not present

## 2017-01-28 DIAGNOSIS — R2689 Other abnormalities of gait and mobility: Secondary | ICD-10-CM | POA: Diagnosis not present

## 2017-01-28 DIAGNOSIS — M6281 Muscle weakness (generalized): Secondary | ICD-10-CM | POA: Diagnosis not present

## 2017-01-28 DIAGNOSIS — M5417 Radiculopathy, lumbosacral region: Secondary | ICD-10-CM | POA: Diagnosis not present

## 2017-01-30 DIAGNOSIS — M6281 Muscle weakness (generalized): Secondary | ICD-10-CM | POA: Diagnosis not present

## 2017-01-30 DIAGNOSIS — M5417 Radiculopathy, lumbosacral region: Secondary | ICD-10-CM | POA: Diagnosis not present

## 2017-01-30 DIAGNOSIS — M545 Low back pain: Secondary | ICD-10-CM | POA: Diagnosis not present

## 2017-01-30 DIAGNOSIS — R2689 Other abnormalities of gait and mobility: Secondary | ICD-10-CM | POA: Diagnosis not present

## 2017-02-04 DIAGNOSIS — R2689 Other abnormalities of gait and mobility: Secondary | ICD-10-CM | POA: Diagnosis not present

## 2017-02-04 DIAGNOSIS — M545 Low back pain: Secondary | ICD-10-CM | POA: Diagnosis not present

## 2017-02-04 DIAGNOSIS — M5417 Radiculopathy, lumbosacral region: Secondary | ICD-10-CM | POA: Diagnosis not present

## 2017-02-04 DIAGNOSIS — M6281 Muscle weakness (generalized): Secondary | ICD-10-CM | POA: Diagnosis not present

## 2017-02-06 DIAGNOSIS — M545 Low back pain: Secondary | ICD-10-CM | POA: Diagnosis not present

## 2017-02-06 DIAGNOSIS — M6281 Muscle weakness (generalized): Secondary | ICD-10-CM | POA: Diagnosis not present

## 2017-02-06 DIAGNOSIS — R2689 Other abnormalities of gait and mobility: Secondary | ICD-10-CM | POA: Diagnosis not present

## 2017-02-06 DIAGNOSIS — M5417 Radiculopathy, lumbosacral region: Secondary | ICD-10-CM | POA: Diagnosis not present

## 2017-02-20 DIAGNOSIS — M6281 Muscle weakness (generalized): Secondary | ICD-10-CM | POA: Diagnosis not present

## 2017-02-20 DIAGNOSIS — M5417 Radiculopathy, lumbosacral region: Secondary | ICD-10-CM | POA: Diagnosis not present

## 2017-02-20 DIAGNOSIS — R2689 Other abnormalities of gait and mobility: Secondary | ICD-10-CM | POA: Diagnosis not present

## 2017-02-20 DIAGNOSIS — M545 Low back pain: Secondary | ICD-10-CM | POA: Diagnosis not present

## 2017-03-05 DIAGNOSIS — M545 Low back pain: Secondary | ICD-10-CM | POA: Diagnosis not present

## 2017-03-05 DIAGNOSIS — M4716 Other spondylosis with myelopathy, lumbar region: Secondary | ICD-10-CM | POA: Diagnosis not present

## 2017-03-09 DIAGNOSIS — R0600 Dyspnea, unspecified: Secondary | ICD-10-CM | POA: Diagnosis not present

## 2017-03-09 DIAGNOSIS — J111 Influenza due to unidentified influenza virus with other respiratory manifestations: Secondary | ICD-10-CM | POA: Diagnosis not present

## 2017-03-10 ENCOUNTER — Other Ambulatory Visit: Payer: Self-pay | Admitting: Orthopaedic Surgery

## 2017-03-10 DIAGNOSIS — M4716 Other spondylosis with myelopathy, lumbar region: Secondary | ICD-10-CM

## 2017-03-17 ENCOUNTER — Ambulatory Visit
Admission: RE | Admit: 2017-03-17 | Discharge: 2017-03-17 | Disposition: A | Discharge status: Home / Self Care | Payer: PPO | Source: Ambulatory Visit | Attending: Orthopaedic Surgery | Admitting: Orthopaedic Surgery

## 2017-03-17 DIAGNOSIS — F411 Generalized anxiety disorder: Secondary | ICD-10-CM | POA: Diagnosis not present

## 2017-03-17 DIAGNOSIS — M48061 Spinal stenosis, lumbar region without neurogenic claudication: Secondary | ICD-10-CM | POA: Diagnosis not present

## 2017-03-17 DIAGNOSIS — M4716 Other spondylosis with myelopathy, lumbar region: Secondary | ICD-10-CM

## 2017-03-20 DIAGNOSIS — M5412 Radiculopathy, cervical region: Secondary | ICD-10-CM | POA: Diagnosis not present

## 2017-03-20 DIAGNOSIS — M545 Low back pain: Secondary | ICD-10-CM | POA: Diagnosis not present

## 2017-03-21 DIAGNOSIS — M7730 Calcaneal spur, unspecified foot: Secondary | ICD-10-CM | POA: Diagnosis not present

## 2017-03-21 DIAGNOSIS — Q667 Congenital pes cavus: Secondary | ICD-10-CM | POA: Diagnosis not present

## 2017-03-26 ENCOUNTER — Encounter: Payer: Self-pay | Admitting: Gastroenterology

## 2017-04-28 DIAGNOSIS — M48062 Spinal stenosis, lumbar region with neurogenic claudication: Secondary | ICD-10-CM | POA: Diagnosis not present

## 2017-05-20 DIAGNOSIS — M48062 Spinal stenosis, lumbar region with neurogenic claudication: Secondary | ICD-10-CM | POA: Diagnosis not present

## 2017-05-23 DIAGNOSIS — Z0000 Encounter for general adult medical examination without abnormal findings: Secondary | ICD-10-CM | POA: Diagnosis not present

## 2017-05-23 DIAGNOSIS — Z1389 Encounter for screening for other disorder: Secondary | ICD-10-CM | POA: Diagnosis not present

## 2017-05-23 DIAGNOSIS — E119 Type 2 diabetes mellitus without complications: Secondary | ICD-10-CM | POA: Diagnosis not present

## 2017-05-23 DIAGNOSIS — R3911 Hesitancy of micturition: Secondary | ICD-10-CM | POA: Diagnosis not present

## 2017-05-23 DIAGNOSIS — I10 Essential (primary) hypertension: Secondary | ICD-10-CM | POA: Diagnosis not present

## 2017-05-23 DIAGNOSIS — E7800 Pure hypercholesterolemia, unspecified: Secondary | ICD-10-CM | POA: Diagnosis not present

## 2017-06-05 DIAGNOSIS — H35352 Cystoid macular degeneration, left eye: Secondary | ICD-10-CM | POA: Diagnosis not present

## 2017-06-10 DIAGNOSIS — M545 Low back pain: Secondary | ICD-10-CM | POA: Diagnosis not present

## 2017-06-10 DIAGNOSIS — M48062 Spinal stenosis, lumbar region with neurogenic claudication: Secondary | ICD-10-CM | POA: Diagnosis not present

## 2017-06-10 DIAGNOSIS — M4716 Other spondylosis with myelopathy, lumbar region: Secondary | ICD-10-CM | POA: Diagnosis not present

## 2017-08-25 DIAGNOSIS — F411 Generalized anxiety disorder: Secondary | ICD-10-CM | POA: Diagnosis not present

## 2017-08-25 DIAGNOSIS — E119 Type 2 diabetes mellitus without complications: Secondary | ICD-10-CM | POA: Diagnosis not present

## 2017-08-25 DIAGNOSIS — I10 Essential (primary) hypertension: Secondary | ICD-10-CM | POA: Diagnosis not present

## 2017-09-02 DIAGNOSIS — M9905 Segmental and somatic dysfunction of pelvic region: Secondary | ICD-10-CM | POA: Diagnosis not present

## 2017-09-02 DIAGNOSIS — M62830 Muscle spasm of back: Secondary | ICD-10-CM | POA: Diagnosis not present

## 2017-09-02 DIAGNOSIS — M9903 Segmental and somatic dysfunction of lumbar region: Secondary | ICD-10-CM | POA: Diagnosis not present

## 2017-09-02 DIAGNOSIS — M9902 Segmental and somatic dysfunction of thoracic region: Secondary | ICD-10-CM | POA: Diagnosis not present

## 2017-09-02 DIAGNOSIS — M5416 Radiculopathy, lumbar region: Secondary | ICD-10-CM | POA: Diagnosis not present

## 2017-10-07 DIAGNOSIS — M62830 Muscle spasm of back: Secondary | ICD-10-CM | POA: Diagnosis not present

## 2017-10-07 DIAGNOSIS — M9903 Segmental and somatic dysfunction of lumbar region: Secondary | ICD-10-CM | POA: Diagnosis not present

## 2017-10-07 DIAGNOSIS — M5416 Radiculopathy, lumbar region: Secondary | ICD-10-CM | POA: Diagnosis not present

## 2017-10-07 DIAGNOSIS — M9902 Segmental and somatic dysfunction of thoracic region: Secondary | ICD-10-CM | POA: Diagnosis not present

## 2017-10-07 DIAGNOSIS — M9905 Segmental and somatic dysfunction of pelvic region: Secondary | ICD-10-CM | POA: Diagnosis not present

## 2017-11-04 DIAGNOSIS — Z23 Encounter for immunization: Secondary | ICD-10-CM | POA: Diagnosis not present

## 2017-11-26 DIAGNOSIS — E119 Type 2 diabetes mellitus without complications: Secondary | ICD-10-CM | POA: Diagnosis not present

## 2018-02-23 DIAGNOSIS — M25562 Pain in left knee: Secondary | ICD-10-CM | POA: Diagnosis not present

## 2018-03-03 DIAGNOSIS — E119 Type 2 diabetes mellitus without complications: Secondary | ICD-10-CM | POA: Diagnosis not present

## 2018-03-16 DIAGNOSIS — M25562 Pain in left knee: Secondary | ICD-10-CM | POA: Diagnosis not present

## 2018-03-21 IMAGING — MR MR LUMBAR SPINE W/O CM
4 of 5 series · 25 of 48 positions shown · non-contrast
Comparison: 10/23/2016 lumbar spine radiographs

CLINICAL DATA: 66 y/o M; lower back pain radiating to the left hip
and buttocks for 6 months.

EXAM:
MRI LUMBAR SPINE WITHOUT CONTRAST
TECHNIQUE: Multiplanar, multisequence MR imaging of the lumbar spine was
performed. No intravenous contrast was administered.

[Series 3: T2 post-contrast · sagittal · 4.0mm · 0.55mm/px · 6 of 14 slices shown]
[im 1/14]
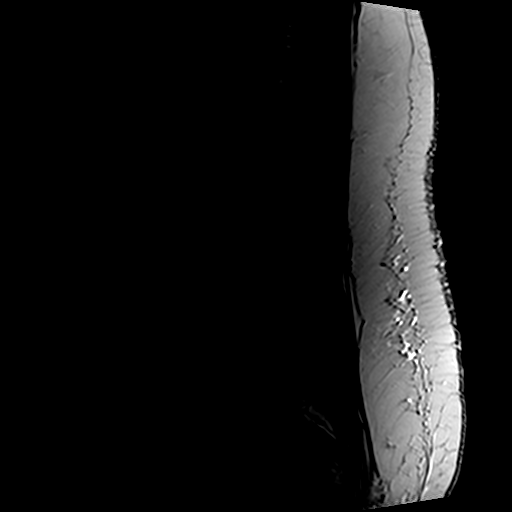
[im 3/14]
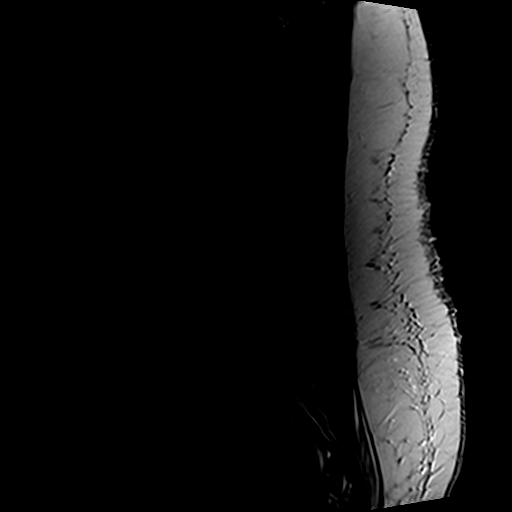
[im 6/14]
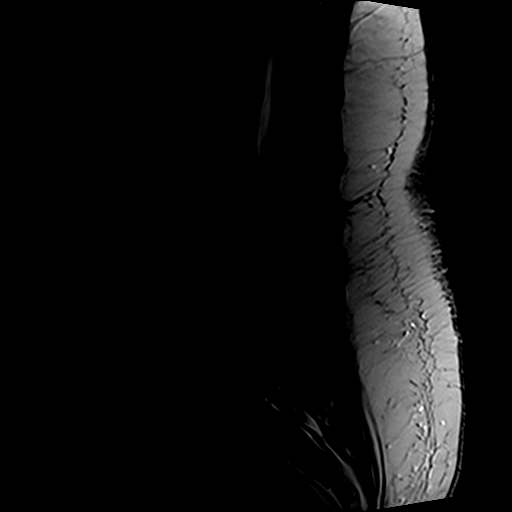
[im 8/14]
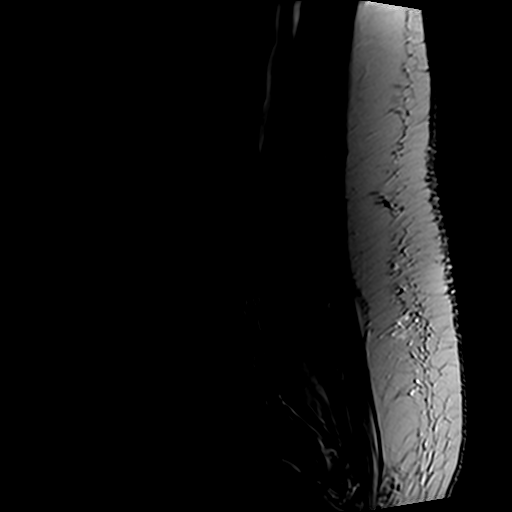
[im 11/14]
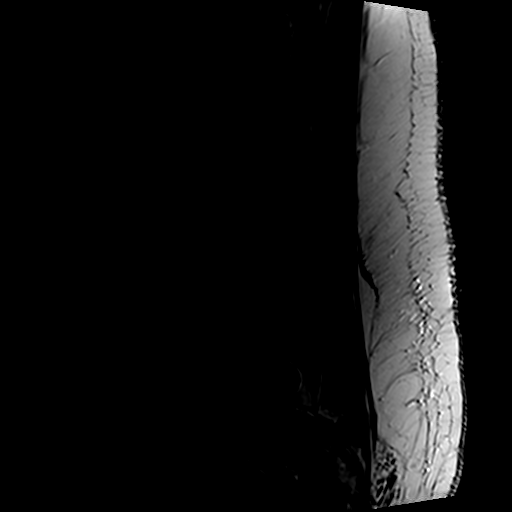
[im 14/14]
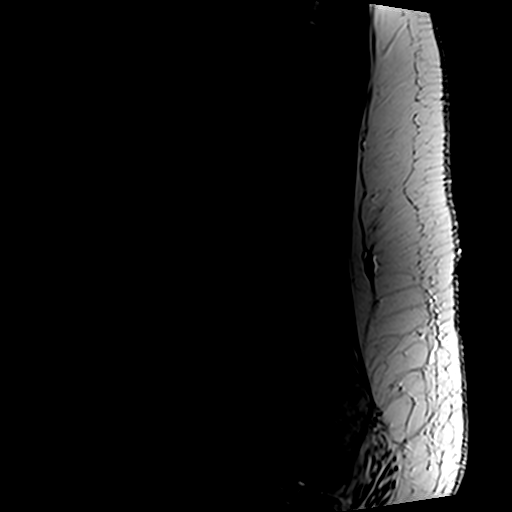

[Series 5: T1 · sagittal · 4.0mm · 0.55mm/px · 6 of 14 slices shown (1 of 2)]
[im 1/14]
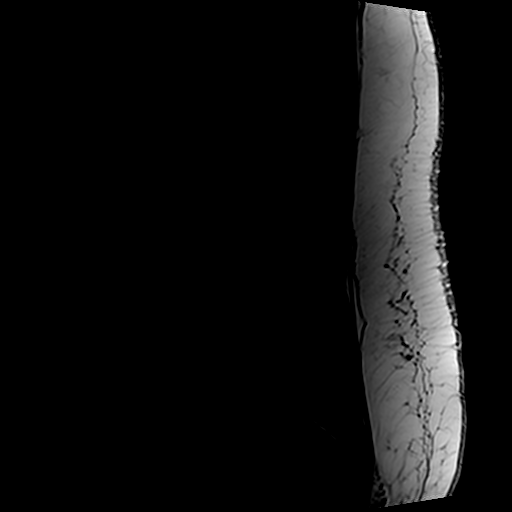
[im 3/14]
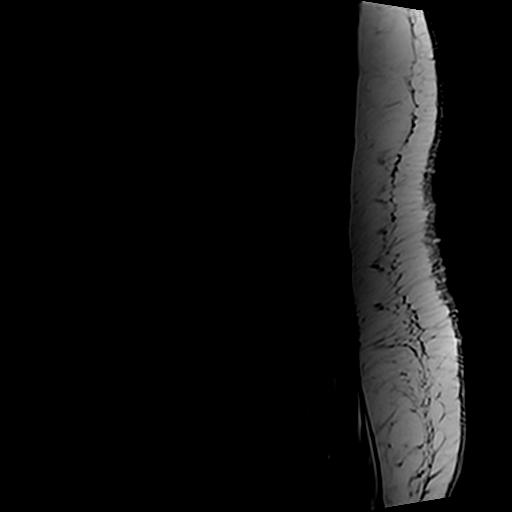
[im 6/14]
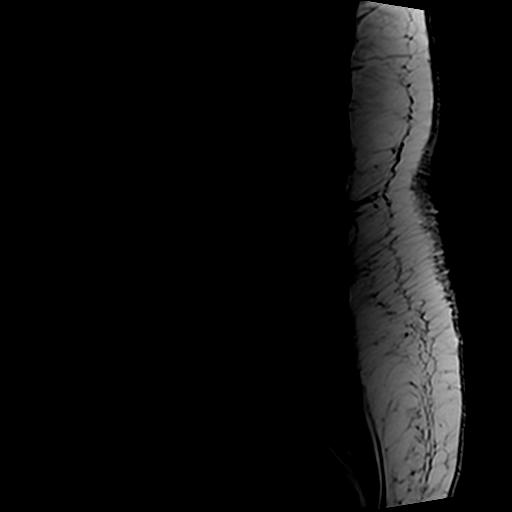
[im 8/14]
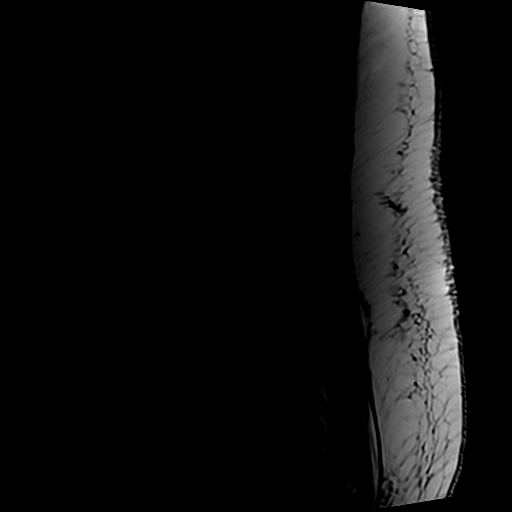
[im 11/14]
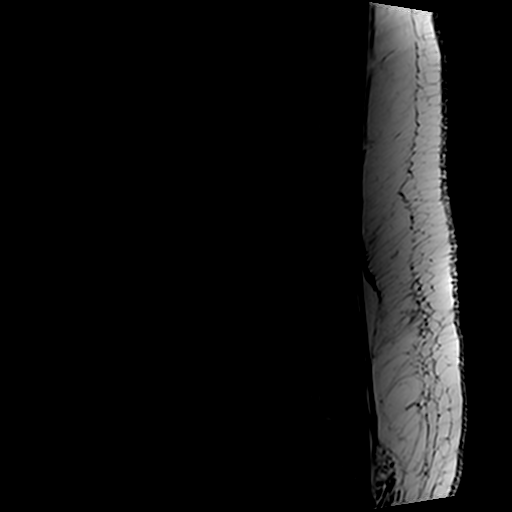
[im 14/14]
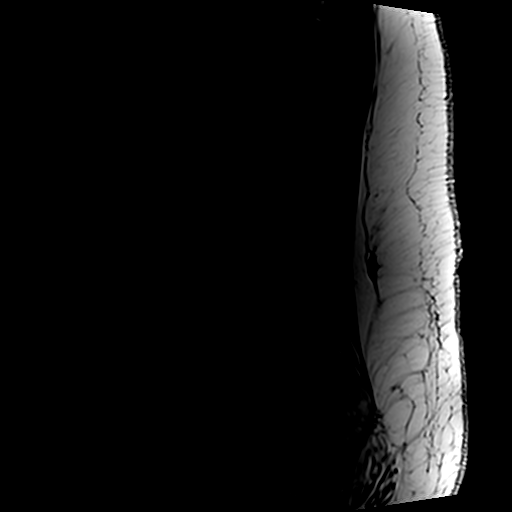

[Series 6: T2 · axial · 4.0mm · 0.74mm/px · z∈[-181,+26]mm · 9 of 35 slices shown]
[im 1/35]
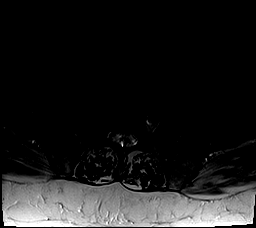
[im 5/35]
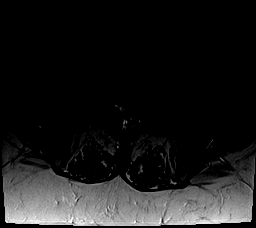
[im 10/35]
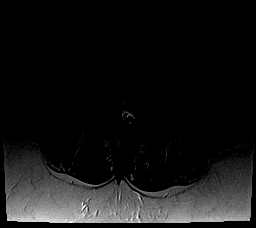
[im 15/35]
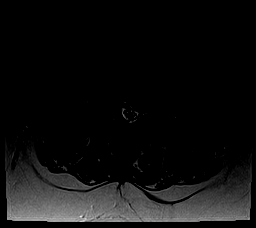
[im 18/35]
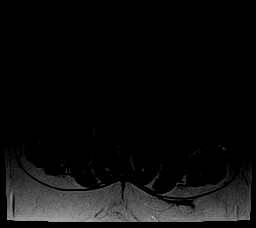
[im 20/35]
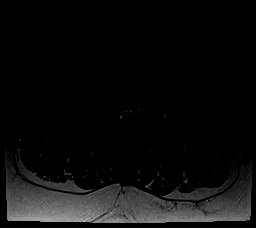
[im 25/35]
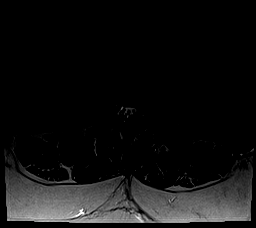
[im 30/35]
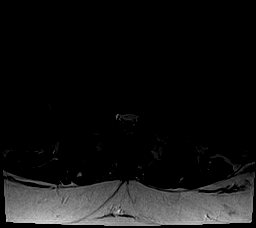
[im 35/35]
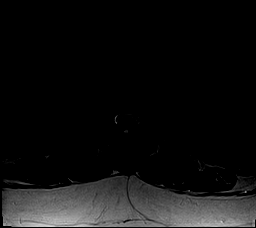

[Series 7: T1 · axial · 4.0mm · 0.37mm/px · z∈[-181,-11]mm · 4 of 35 slices shown (2 of 2)]
[im 1/35]
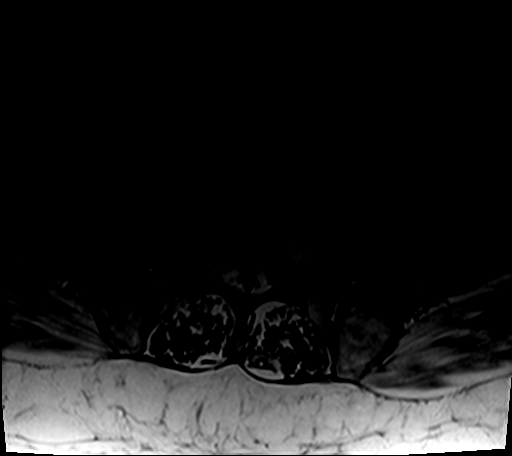
[im 5/35]
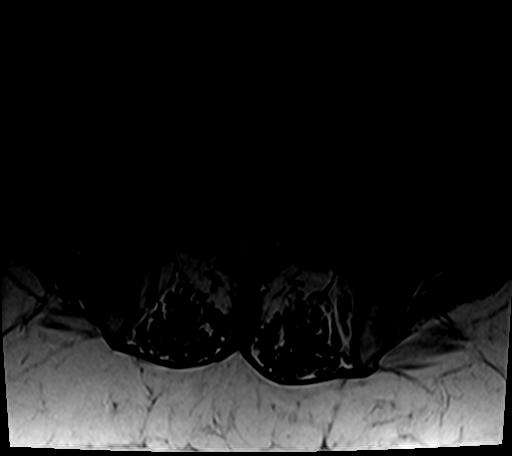
[im 18/35]
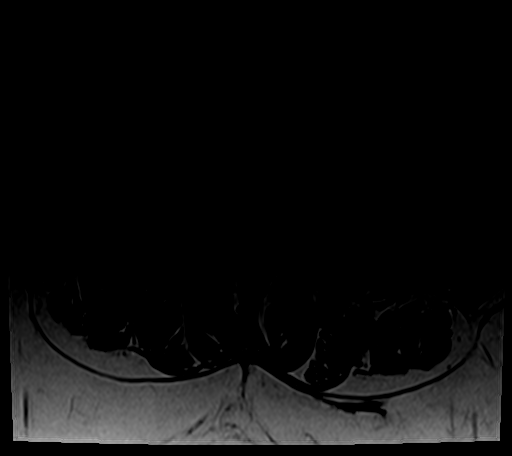
[im 30/35]
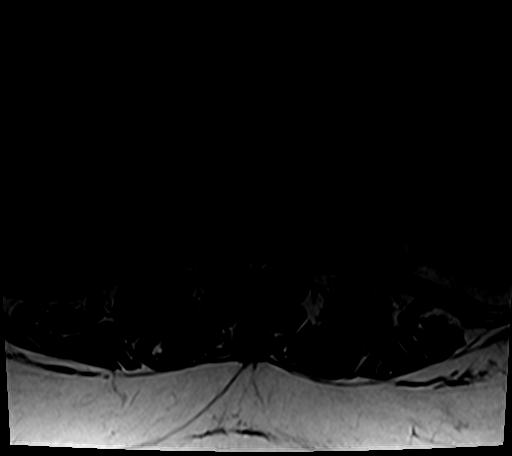

[25 of 48 positions shown; findings below may reference images not displayed]

FINDINGS: Segmentation:  Standard.

Alignment:  Physiologic.

Vertebrae:  No fracture, evidence of discitis, or bone lesion.

Conus medullaris and cauda equina: Conus extends to the L1 level.
Conus and cauda equina appear normal.

Paraspinal and other soft tissues: Negative.

Disc levels:

L1-2: Small disc bulge and mild facet hypertrophy greater on the
left. Mild left foraminal stenosis. No canal stenosis.

L2-3: Moderate disc bulge, moderate facet, and mild ligamentum
flavum hypertrophy. Mild bilateral foraminal stenosis. Moderate to
severe canal stenosis.

L3-4: Moderate disc bulge, moderate facet, and moderate ligamentum
flavum hypertrophy. Small left extraforaminal disc protrusion with
contact on exiting left L3 nerve root in the extraforaminal zone
(series 6, image 18). Mild right and moderate left foraminal
stenosis. Severe canal stenosis.

L4-5: Disc bulge, endplate marginal osteophytes, mild facet
hypertrophy, and left subarticular disc protrusion with annular
fissure (series 6, image 25). Mild foraminal stenosis and moderate
to severe canal stenosis. Left subarticular disc protrusion effaces
left lateral recess with contact on descending left L5 nerve root
(series 6, image 26).

L5-S1: Diffuse disc bulge with endplate marginal osteophytes and
large left central disc protrusion combined with mild facet
hypertrophy. Severe canal and lateral recess stenosis. Disc
protrusion contact on bilateral S1 nerve roots in the lateral
recesses greater on the left. Moderate right and severe left
foraminal stenosis.
IMPRESSION: 1. No acute osseous abnormality or malalignment.
2. Severe lumbar spondylosis with extensive discogenic degenerative
disease.
3. Multifactorial moderate to severe L2-3, severe L3-4, moderate to
severe L4-5, and severe L5-S1 canal stenosis.
4. Multilevel mild and moderate foraminal stenosis. Severe left
L5-S1 foraminal stenosis.
5. L3-4 left extraforaminal small disc protrusion contacting exiting
left L3 nerve root.
6. L4-5 left subarticular disc protrusion contacts descending left
L5 nerve root in lateral recess.
7. L5-S1 large central protrusion contacts bilateral S1 nerve roots
in the lateral recesses, greater on the left.

By: Trav Thoma M.D.

## 2018-06-08 DIAGNOSIS — E7800 Pure hypercholesterolemia, unspecified: Secondary | ICD-10-CM | POA: Diagnosis not present

## 2018-06-08 DIAGNOSIS — Z1389 Encounter for screening for other disorder: Secondary | ICD-10-CM | POA: Diagnosis not present

## 2018-06-08 DIAGNOSIS — I10 Essential (primary) hypertension: Secondary | ICD-10-CM | POA: Diagnosis not present

## 2018-06-08 DIAGNOSIS — Z0000 Encounter for general adult medical examination without abnormal findings: Secondary | ICD-10-CM | POA: Diagnosis not present

## 2018-06-08 DIAGNOSIS — E119 Type 2 diabetes mellitus without complications: Secondary | ICD-10-CM | POA: Diagnosis not present

## 2018-06-08 DIAGNOSIS — Z125 Encounter for screening for malignant neoplasm of prostate: Secondary | ICD-10-CM | POA: Diagnosis not present

## 2018-09-10 DIAGNOSIS — Z23 Encounter for immunization: Secondary | ICD-10-CM | POA: Diagnosis not present

## 2018-09-10 DIAGNOSIS — E119 Type 2 diabetes mellitus without complications: Secondary | ICD-10-CM | POA: Diagnosis not present

## 2018-09-10 DIAGNOSIS — I10 Essential (primary) hypertension: Secondary | ICD-10-CM | POA: Diagnosis not present

## 2018-12-16 DIAGNOSIS — M549 Dorsalgia, unspecified: Secondary | ICD-10-CM | POA: Diagnosis not present

## 2018-12-16 DIAGNOSIS — I10 Essential (primary) hypertension: Secondary | ICD-10-CM | POA: Diagnosis not present

## 2018-12-16 DIAGNOSIS — E119 Type 2 diabetes mellitus without complications: Secondary | ICD-10-CM | POA: Diagnosis not present

## 2019-03-16 DIAGNOSIS — I10 Essential (primary) hypertension: Secondary | ICD-10-CM | POA: Diagnosis not present

## 2019-03-16 DIAGNOSIS — E119 Type 2 diabetes mellitus without complications: Secondary | ICD-10-CM | POA: Diagnosis not present

## 2019-04-05 DIAGNOSIS — H903 Sensorineural hearing loss, bilateral: Secondary | ICD-10-CM | POA: Diagnosis not present

## 2019-04-05 DIAGNOSIS — H6121 Impacted cerumen, right ear: Secondary | ICD-10-CM | POA: Diagnosis not present

## 2019-04-13 DIAGNOSIS — H903 Sensorineural hearing loss, bilateral: Secondary | ICD-10-CM | POA: Diagnosis not present

## 2019-06-17 DIAGNOSIS — E1165 Type 2 diabetes mellitus with hyperglycemia: Secondary | ICD-10-CM | POA: Diagnosis not present

## 2019-09-20 DIAGNOSIS — Z0000 Encounter for general adult medical examination without abnormal findings: Secondary | ICD-10-CM | POA: Diagnosis not present

## 2019-09-20 DIAGNOSIS — I10 Essential (primary) hypertension: Secondary | ICD-10-CM | POA: Diagnosis not present

## 2019-09-20 DIAGNOSIS — Z23 Encounter for immunization: Secondary | ICD-10-CM | POA: Diagnosis not present

## 2019-09-20 DIAGNOSIS — Z125 Encounter for screening for malignant neoplasm of prostate: Secondary | ICD-10-CM | POA: Diagnosis not present

## 2019-09-20 DIAGNOSIS — E7800 Pure hypercholesterolemia, unspecified: Secondary | ICD-10-CM | POA: Diagnosis not present

## 2019-09-20 DIAGNOSIS — E119 Type 2 diabetes mellitus without complications: Secondary | ICD-10-CM | POA: Diagnosis not present

## 2019-09-20 DIAGNOSIS — Z1389 Encounter for screening for other disorder: Secondary | ICD-10-CM | POA: Diagnosis not present

## 2019-11-08 DIAGNOSIS — Z1211 Encounter for screening for malignant neoplasm of colon: Secondary | ICD-10-CM | POA: Diagnosis not present

## 2019-11-08 DIAGNOSIS — Z1212 Encounter for screening for malignant neoplasm of rectum: Secondary | ICD-10-CM | POA: Diagnosis not present

## 2019-11-11 ENCOUNTER — Encounter: Payer: Self-pay | Admitting: Physician Assistant

## 2019-11-24 DIAGNOSIS — E119 Type 2 diabetes mellitus without complications: Secondary | ICD-10-CM | POA: Insufficient documentation

## 2019-11-24 DIAGNOSIS — F411 Generalized anxiety disorder: Secondary | ICD-10-CM | POA: Insufficient documentation

## 2019-11-24 DIAGNOSIS — F419 Anxiety disorder, unspecified: Secondary | ICD-10-CM | POA: Insufficient documentation

## 2019-11-25 ENCOUNTER — Encounter: Payer: Self-pay | Admitting: Physician Assistant

## 2019-11-25 ENCOUNTER — Other Ambulatory Visit: Payer: Self-pay

## 2019-11-25 ENCOUNTER — Ambulatory Visit (INDEPENDENT_AMBULATORY_CARE_PROVIDER_SITE_OTHER): Payer: PPO | Admitting: Physician Assistant

## 2019-11-25 VITALS — BP 112/72 | HR 76 | Ht 69.0 in | Wt 233.4 lb

## 2019-11-25 DIAGNOSIS — R195 Other fecal abnormalities: Secondary | ICD-10-CM | POA: Diagnosis not present

## 2019-11-25 DIAGNOSIS — K649 Unspecified hemorrhoids: Secondary | ICD-10-CM | POA: Diagnosis not present

## 2019-11-25 MED ORDER — PLENVU 140 G PO SOLR: ORAL | 0 refills | Status: DC

## 2019-11-25 NOTE — Progress Notes (Signed)
Chief Complaint: Blood in stool  HPI:    [name] is a 69 year old male with a past medical history as listed below, known to Dr. Chales Abrahams previously, including a OSA on CPAP, who was referred to me by Crist Fat, MD for a complaint of blood in stool.      09/20/2019 CBC normal, CMP with a potassium of 5.6 and otherwise normal, PSA normal, hemoglobin A1c 6.6%, TSH normal    11/08/2019 fecal occult blood positive.    Today, the patient resents to clinic and tells me that he occasionally has issues with hemorrhoids.  Tells me that they will swell if he stands too long or if he increases intra-abdominal pressure or when he is severely constipated.  Prior to doing the samples for his fecal occult test he was standing for almost 2 days straight while helping at a church barbecue.  Tells me "it may not have been a good time to do the test".  Tells me he thinks the blood was just from this.  Occasionally he will see bright red blood on the toilet paper.  Tells me that he tries to use MiraLAX on a regular basis to decrease times of constipation, really only has trouble with this about once a month or so.    Last colonoscopy with Dr. Chales Abrahams 7 years ago.    Denies fever, chills, weight loss or change in bowel habits.  Past Medical History:  Diagnosis Date  . Arthritis   . Depression   . Diabetes mellitus   . Obstructive sleep apnea on CPAP   . Sleep apnea    does not use CPAP  . Wears glasses   . Wears hearing aid    both ears    Past Surgical History:  Procedure Laterality Date  . ANTERIOR CERVICAL DECOMP/DISCECTOMY FUSION N/A 03/11/2012   Procedure: ANTERIOR CERVICAL DECOMPRESSION/DISCECTOMY FUSION 3 LEVELS;  Surgeon: Karn Cassis, MD;  Location: [facility];  Service: Neurosurgery;  Laterality: N/A;  Anterior cervical decompression/fusion Cervical four-five, Cervical five-six, Cervical six-seven  . COLONOSCOPY    . EYE SURGERY     catarct bil  . KNEE ARTHROSCOPY  2007   left, right  09/2011  . SHOULDER ARTHROSCOPY  [id number]   [id number]    Current Outpatient Medications  Medication Sig Dispense Refill  . cetirizine (ZYRTEC) 10 MG tablet Take 10 mg by mouth daily.    . citalopram (CELEXA) 40 MG tablet Take 40 mg by mouth daily.    Marland Kitchen lisinopril (ZESTRIL) 2.5 MG tablet Take 2.5 mg by mouth daily.    . montelukast (SINGULAIR) 10 MG tablet Take 10 mg by mouth at bedtime.    . pioglitazone (ACTOS) 15 MG tablet Take 15 mg by mouth daily.    . pravastatin (PRAVACHOL) 40 MG tablet Take 40 mg by mouth daily.     No current facility-administered medications for this visit.    Allergies as of 11/25/2019  . (No Known Allergies)    No family history on file.  Social History   Socioeconomic History  . Marital status: Married    Spouse name: Not on file  . Number of children: Not on file  . Years of education: Not on file  . Highest education level: Not on file  Occupational History  . Not on file  Tobacco Use  . Smoking status: Never Smoker  Substance and Sexual Activity  . Alcohol use: No  . Drug use: No  . Sexual activity: Not  on file  Other Topics Concern  . Not on file  Social History Narrative  . Not on file   Social Determinants of Health   Financial Resource Strain:   . Difficulty of Paying Living Expenses: Not on file  Food Insecurity:   . Worried About Programme researcher, broadcasting/film/video in the Last Year: Not on file  . Ran Out of Food in the Last Year: Not on file  Transportation Needs:   . Lack of Transportation (Medical): Not on file  . Lack of Transportation (Non-Medical): Not on file  Physical Activity:   . Days of Exercise per Week: Not on file  . Minutes of Exercise per Session: Not on file  Stress:   . Feeling of Stress : Not on file  Social Connections:   . Frequency of Communication with Friends and Family: Not on file  . Frequency of Social Gatherings with Friends and Family: Not on file  . Attends Religious Services: Not on file  . Active  Member of Clubs or Organizations: Not on file  . Attends Banker Meetings: Not on file  . Marital Status: Not on file  Intimate Partner Violence:   . Fear of Current or Ex-Partner: Not on file  . Emotionally Abused: Not on file  . Physically Abused: Not on file  . Sexually Abused: Not on file    Review of Systems:    Constitutional: No weight loss, fever or chills Skin: No rash Cardiovascular: No chest pain Respiratory: No SOB Gastrointestinal: See HPI and otherwise negative Genitourinary: No dysuria Neurological: No headache, dizziness or syncope Musculoskeletal: No new muscle or joint pain Hematologic: No bruising Psychiatric: No history of depression or anxiety   Physical Exam:  Vital signs: BP 112/72 (BP Location: Right Arm, Patient Position: Sitting, Cuff Size: Normal)   Pulse 76   Ht 5\' 9"  (1.753 m)   Wt 233 lb 6 oz (105.9 kg)   BMI 34.46 kg/m   Constitutional:   Pleasant Caucasian male appears to be in NAD, Well developed, Well nourished, alert and cooperative Head:  Normocephalic and atraumatic. Eyes:   PEERL, EOMI. No icterus. Conjunctiva pink. Ears:  Normal auditory acuity. Neck:  Supple Throat: Oral cavity and pharynx without inflammation, swelling or lesion.  Respiratory: Respirations even and unlabored. Lungs clear to auscultation bilaterally.   No wheezes, crackles, or rhonchi.  Cardiovascular: Normal S1, S2. No MRG. Regular rate and rhythm. No peripheral edema, cyanosis or pallor.  Gastrointestinal:  Soft, nondistended, nontender. No rebound or guarding. Normal bowel sounds. No appreciable masses or hepatomegaly. Rectal:  Not performed.  Msk:  Symmetrical without gross deformities. Without edema, no deformity or joint abnormality.  Neurologic:  Alert and  oriented x4;  grossly normal neurologically.  Skin:   Dry and intact without significant lesions or rashes. Psychiatric:Demonstrates good judgement and reason without abnormal affect or  behaviors.  See HPI for recent labs and imaging  Assessment: 1.  Fecal occult blood positive: 11/08/2019, last colonoscopy 7 years ago with Dr. 13/01/2019 which he tells me was normal; most likely hemorrhoids versus polyps versus other 2.  Hemorrhoids: Patient says they swell and sometimes bleed, typically after standing for a long period of time  Plan: 1.  Scheduled patient for diagnostic colonoscopy given positive occult blood in his stool with Dr. Chales Abrahams in the [facility].  Patient received information regarding risks for the procedure and agreed to proceed.  He has had his Covid vaccines. 2.  Does complain of hemorrhoids and  some bleeding, explained that these could be evaluated during time of colonoscopy, maybe he would benefit from a banding in the future. 3.  Did agree that he should use a laxative on a regular basis to avoid times of severe constipation. 4.  Patient to follow in clinic per recommendations from Dr. Chales Abrahams after time of procedure  Hyacinth Meeker, PA-C [facility] 11/25/2019, 9:26 AM  Cc: Crist Fat, MD

## 2019-11-25 NOTE — Patient Instructions (Signed)
If you are age 70 or older, your body mass index should be between 23-30. Your Body mass index is 34.46 kg/m. If this is out of the aforementioned range listed, please consider follow up with your Primary Care Provider.  If you are age 44 or younger, your body mass index should be between 19-25. Your Body mass index is 34.46 kg/m. If this is out of the aformentioned range listed, please consider follow up with your Primary Care Provider.   You have been scheduled for a colonoscopy. Please follow written instructions given to you at your visit today.  Please pick up your prep supplies at the pharmacy within the next 1-3 days. If you use inhalers (even only as needed), please bring them with you on the day of your procedure.  Due to recent changes in healthcare laws, you may see the results of your imaging and laboratory studies on MyChart before your provider has had a chance to review them.  We understand that in some cases there may be results that are confusing or concerning to you. Not all laboratory results come back in the same time frame and the provider may be waiting for multiple results in order to interpret others.  Please give [place] 48 hours in order for your provider to thoroughly review all the results before contacting the office for clarification of your results.   Thank you for choosing me and [facility].  Hyacinth Meeker, PA-C

## 2019-11-29 ENCOUNTER — Other Ambulatory Visit: Payer: Self-pay

## 2019-11-29 ENCOUNTER — Telehealth: Payer: Self-pay | Admitting: Physician Assistant

## 2019-11-29 MED ORDER — NA SULFATE-K SULFATE-MG SULF 17.5-3.13-1.6 GM/177ML PO SOLN: 1.0000 | Freq: Once | ORAL | 0 refills | Status: AC

## 2019-11-29 NOTE — Telephone Encounter (Signed)
This needs to go to the CMA thanks  

## 2019-12-06 ENCOUNTER — Telehealth: Payer: Self-pay | Admitting: Physician Assistant

## 2019-12-06 NOTE — Telephone Encounter (Signed)
Called patient back and  I went over instructions of Suprep instructions with him. He understood instructions

## 2019-12-06 NOTE — Telephone Encounter (Signed)
Pt is requesting a call back from a nurse to discuss the instructions he needs to do for his new prep he was called in.  Pharmacy  Drug

## 2019-12-08 ENCOUNTER — Encounter: Payer: Self-pay | Admitting: Gastroenterology

## 2019-12-08 ENCOUNTER — Ambulatory Visit (AMBULATORY_SURGERY_CENTER): Payer: PPO | Admitting: Gastroenterology

## 2019-12-08 ENCOUNTER — Other Ambulatory Visit: Payer: Self-pay

## 2019-12-08 VITALS — BP 134/93 | HR 71 | Temp 96.8°F | Resp 14 | Ht 69.0 in | Wt 233.0 lb

## 2019-12-08 DIAGNOSIS — R195 Other fecal abnormalities: Secondary | ICD-10-CM

## 2019-12-08 DIAGNOSIS — K5730 Diverticulosis of large intestine without perforation or abscess without bleeding: Secondary | ICD-10-CM | POA: Diagnosis not present

## 2019-12-08 DIAGNOSIS — K648 Other hemorrhoids: Secondary | ICD-10-CM

## 2019-12-08 MED ORDER — SODIUM CHLORIDE 0.9 % IV SOLN: 500.0000 mL | Freq: Once | INTRAVENOUS | Status: DC

## 2019-12-08 NOTE — Progress Notes (Signed)
pt tolerated well. VSS. awake and to recovery. Report given to RN.  

## 2019-12-08 NOTE — Patient Instructions (Signed)
   HANDOUTS ON DIVERTICULOSIS & HEMORRHOIDS GIVEN TO YOU TODAY  YOU HAD AN ENDOSCOPIC PROCEDURE TODAY AT THE [facility]:   Refer to the procedure report that was given to you for any specific questions about what was found during the examination.  If the procedure report does not answer your questions, please call your gastroenterologist to clarify.  If you requested that your care partner not be given the details of your procedure findings, then the procedure report has been included in a sealed envelope for you to review at your convenience later.  YOU SHOULD EXPECT: Some feelings of bloating in the abdomen. Passage of more gas than usual.  Walking can help get rid of the air that was put into your GI tract during the procedure and reduce the bloating. If you had a lower endoscopy (such as a colonoscopy or flexible sigmoidoscopy) you may notice spotting of blood in your stool or on the toilet paper. If you underwent a bowel prep for your procedure, you may not have a normal bowel movement for a few days.  Please Note:  You might notice some irritation and congestion in your nose or some drainage.  This is from the oxygen used during your procedure.  There is no need for concern and it should clear up in a day or so.  SYMPTOMS TO REPORT IMMEDIATELY:  Following lower endoscopy (colonoscopy or flexible sigmoidoscopy):  Excessive amounts of blood in the stool  Significant tenderness or worsening of abdominal pains  Swelling of the abdomen that is new, acute  Fever of 100F or higher   For urgent or emergent issues, a gastroenterologist can be reached at any hour by calling [phone]. Do not use MyChart messaging for urgent concerns.    DIET:  We do recommend a small meal at first, but then you may proceed to your regular diet.  Drink plenty of fluids but you should avoid alcoholic beverages for 24 hours.  ACTIVITY:  You should plan to take it easy for the rest of today and  you should NOT DRIVE or use heavy machinery until tomorrow (because of the sedation medicines used during the test).    FOLLOW UP: Our staff will call the number listed on your records 48-72 hours following your procedure to check on you and address any questions or concerns that you may have regarding the information given to you following your procedure. If we do not reach you, we will leave a message.  We will attempt to reach you two times.  During this call, we will ask if you have developed any symptoms of COVID 19. If you develop any symptoms (ie: fever, flu-like symptoms, shortness of breath, cough etc.) before then, please call [phone].  If you test positive for Covid 19 in the 2 weeks post procedure, please call and report this information to us.    If any biopsies were taken you will be contacted by phone or by letter within the next 1-3 weeks.  Please call us at [phone] if you have not heard about the biopsies in 3 weeks.    SIGNATURES/CONFIDENTIALITY: You and/or your care partner have signed paperwork which will be entered into your electronic medical record.  These signatures attest to the fact that that the information above on your After Visit Summary has been reviewed and is understood.  Full responsibility of the confidentiality of this discharge information lies with you and/or your care-partner.  

## 2019-12-08 NOTE — Op Note (Signed)
[facility] Patient Name: Albert Benjamin Procedure Date: 12/08/2019 10:51 AM MRN: [id number] Endoscopist: Lynann Bologna , MD Age: 69 Referring MD:  Date of Birth: 03-24-1950 Gender: Male Account #: [id number] Procedure:                Colonoscopy Indications:              Heme positive stool Medicines:                Monitored Anesthesia Care Procedure:                Pre-Anesthesia Assessment:                           - Prior to the procedure, a History and Physical                            was performed, and patient medications and                            allergies were reviewed. The patient's tolerance of                            previous anesthesia was also reviewed. The risks                            and benefits of the procedure and the sedation                            options and risks were discussed with the patient.                            All questions were answered, and informed consent                            was obtained. Prior Anticoagulants: The patient has                            taken no previous anticoagulant or antiplatelet                            agents. ASA Grade Assessment: II - A patient with                            mild systemic disease. After reviewing the risks                            and benefits, the patient was deemed in                            satisfactory condition to undergo the procedure.                           After obtaining informed consent, the colonoscope  was passed under direct vision. Throughout the                            procedure, the patient's blood pressure, pulse, and                            oxygen saturations were monitored continuously. The                            Colonoscope was introduced through the anus and                            advanced to the the cecum, identified by                            appendiceal orifice and ileocecal valve. The                             colonoscopy was performed without difficulty. The                            patient tolerated the procedure well. The quality                            of the bowel preparation was good. The ileocecal                            valve, appendiceal orifice, and rectum were                            photographed. Scope In: 11:11:06 AM Scope Out: 11:21:53 AM Scope Withdrawal Time: 0 hours 6 minutes 57 seconds  Total Procedure Duration: 0 hours 10 minutes 47 seconds  Findings:                 Multiple medium-mouthed diverticula were found in                            the sigmoid colon.                           Non-bleeding internal hemorrhoids were found during                            retroflexion. The hemorrhoids were moderate.                           The exam was otherwise without abnormality on                            direct and retroflexion views. Complications:            No immediate complications. Estimated Blood Loss:     Estimated blood loss: none. Impression:               - Moderate sigmoid diverticulosis.                           -  Non-bleeding internal hemorrhoids.                           - The examination was otherwise normal on direct                            and retroflexion views.                           - No specimens collected. Recommendation:           - Patient has a contact number available for                            emergencies. The signs and symptoms of potential                            delayed complications were discussed with the                            patient. Return to normal activities tomorrow.                            Written discharge instructions were provided to the                            patient.                           - Resume previous diet.                           - Continue present medications.                           - Repeat colonoscopy is not recommended for                             screening purposes. Hence, colonoscopy only if with                            any new problems.                           - Return to GI clinic PRN. Lynann Bologna, MD 12/08/2019 11:25:35 AM This report has been signed electronically.

## 2019-12-10 ENCOUNTER — Telehealth: Payer: Self-pay | Admitting: *Deleted

## 2019-12-10 ENCOUNTER — Telehealth: Payer: Self-pay

## 2019-12-10 NOTE — Telephone Encounter (Signed)
First post procedure follow up call, no answer 

## 2019-12-10 NOTE — Telephone Encounter (Signed)
  Follow up Call-  Call back number 12/08/2019  Post procedure Call Back phone  # [phone]  Permission to leave phone message Yes  Some recent data might be hidden     Patient questions:  Do you have a fever, pain , or abdominal swelling? No. Pain Score  0 *  Have you tolerated food without any problems? Yes.    Have you been able to return to your normal activities? Yes.    Do you have any questions about your discharge instructions: Diet   No. Medications  No. Follow up visit  No.  Do you have questions or concerns about your Care? No.  Actions: * If pain score is 4 or above: No action needed, pain <4.  1. Have you developed a fever since your procedure? no  2.   Have you had an respiratory symptoms (SOB or cough) since your procedure? no  3.   Have you tested positive for COVID 19 since your procedure no  4.   Have you had any family members/close contacts diagnosed with the COVID 19 since your procedure?  no   If yes to any of these questions please route to Laverna Peace, RN and Karlton Lemon, RN

## 2019-12-14 NOTE — Progress Notes (Signed)
Agree with excellent plan RG   Kelly ?colon 12/10 (if any openings) RG

## 2020-01-05 DIAGNOSIS — E7800 Pure hypercholesterolemia, unspecified: Secondary | ICD-10-CM | POA: Diagnosis not present

## 2020-01-05 DIAGNOSIS — M545 Low back pain, unspecified: Secondary | ICD-10-CM | POA: Diagnosis not present

## 2020-01-05 DIAGNOSIS — E119 Type 2 diabetes mellitus without complications: Secondary | ICD-10-CM | POA: Diagnosis not present

## 2020-01-05 DIAGNOSIS — I10 Essential (primary) hypertension: Secondary | ICD-10-CM | POA: Diagnosis not present

## 2020-01-10 ENCOUNTER — Encounter: Payer: Self-pay | Admitting: Gastroenterology

## 2020-01-10 DIAGNOSIS — T8522XA Displacement of intraocular lens, initial encounter: Secondary | ICD-10-CM | POA: Diagnosis not present

## 2020-01-10 DIAGNOSIS — Z961 Presence of intraocular lens: Secondary | ICD-10-CM | POA: Diagnosis not present

## 2020-01-10 DIAGNOSIS — E119 Type 2 diabetes mellitus without complications: Secondary | ICD-10-CM | POA: Diagnosis not present

## 2020-01-11 ENCOUNTER — Ambulatory Visit (INDEPENDENT_AMBULATORY_CARE_PROVIDER_SITE_OTHER): Payer: PPO | Admitting: Ophthalmology

## 2020-01-11 ENCOUNTER — Other Ambulatory Visit: Payer: Self-pay

## 2020-01-11 ENCOUNTER — Encounter (INDEPENDENT_AMBULATORY_CARE_PROVIDER_SITE_OTHER): Payer: Self-pay | Admitting: Ophthalmology

## 2020-01-11 DIAGNOSIS — H4302 Vitreous prolapse, left eye: Secondary | ICD-10-CM | POA: Insufficient documentation

## 2020-01-11 DIAGNOSIS — H43813 Vitreous degeneration, bilateral: Secondary | ICD-10-CM | POA: Diagnosis not present

## 2020-01-11 DIAGNOSIS — T8522XA Displacement of intraocular lens, initial encounter: Secondary | ICD-10-CM | POA: Diagnosis not present

## 2020-01-11 HISTORY — DX: Vitreous prolapse, left eye: H43.02

## 2020-01-11 NOTE — Assessment & Plan Note (Addendum)
Capsule dehiscence, with minor vitreous prolapse anteriorly left eye no vitreous to corneal touch  Note dated 01/18/2020  Ophthalmic biometric measurements completed via [facility], and values inserted into   Ophthalmic biometry computer, EYE cubed.  Values obtained for Zeiss CT Lucia-602,  OS, +15 diopter lens would afford approximately -.5 diopter result  Planned use of scleral tunnel fixation with 3 piece on intraocular lens, via Yamani scleral tunnel technique 2.5 mm posterior to limbus, will add an extra diopter correct for this myopic shift  Will order +16 diopter lens for surgical correction of a aphakia with dislocated intraocular lens

## 2020-01-11 NOTE — Progress Notes (Addendum)
01/11/2020     CHIEF COMPLAINT Patient presents for Retina Evaluation (NP Dislocated IOL OS - Ref'd by Dr. Craig Guess c/o sudden blur OS x 1 week. Pt sts he had CEIOL x 30 years ago. Pt denies ocular pain, flashes, or floaters OU. [facility] stable OD.//Patient had cataract surgery left eye some 30 years ago with Dr. Dolores Lory, [facility].//Onset of blurred vision left eye in the last week or so OS only )   HISTORY OF PRESENT ILLNESS: Albert Benjamin is a 70 y.o. male who presents to the clinic today for:   HPI    Retina Evaluation    In left eye.  This started 1 week ago.  Duration of 1 week.  Context:  distance vision, mid-range vision and near vision.  Treatments tried include no treatments.  Response to treatment was no improvement. Additional comments: NP Dislocated IOL OS - Ref'd by Dr. Renaldo Fiddler  Pt c/o sudden blur OS x 1 week. Pt sts he had CEIOL x 30 years ago. Pt denies ocular pain, flashes, or floaters OU. [facility] stable OD.  Patient had cataract surgery left eye some 30 years ago with Dr. Dolores Lory, [facility].  Onset of blurred vision left eye in the last week or so OS only        Last edited by Hurman Horn, MD on 01/11/2020  9:25 AM. (History)      Referring physician: Townsend Roger, MD [address] [place],  [place] [address]  HISTORICAL INFORMATION:   Selected notes from the [place]: No current outpatient medications on file. (Ophthalmic Drugs)   No current facility-administered medications for this visit. (Ophthalmic Drugs)   Current Outpatient Medications (Other)  Medication Sig  . cetirizine (ZYRTEC) 10 MG tablet Take 10 mg by mouth daily.  . citalopram (CELEXA) 40 MG tablet Take 40 mg by mouth daily.  Marland Kitchen lisinopril (ZESTRIL) 2.5 MG tablet Take 2.5 mg by mouth daily.  . montelukast (SINGULAIR) 10 MG tablet Take 10 mg by mouth at bedtime.  . pioglitazone (ACTOS) 15 MG tablet Take 15 mg by mouth daily.  .  pravastatin (PRAVACHOL) 40 MG tablet Take 40 mg by mouth daily.   No current facility-administered medications for this visit. (Other)      REVIEW OF SYSTEMS:    ALLERGIES No Known Allergies  PAST MEDICAL HISTORY Past Medical History:  Diagnosis Date  . Allergy   . Arthritis   . Cataract    bilkateral surgery 3-40 years ago  . Depression   . Diabetes mellitus   . Hypertension   . Obstructive sleep apnea on CPAP   . Sleep apnea    does not use CPAP  . Wears glasses   . Wears hearing aid    both ears   Past Surgical History:  Procedure Laterality Date  . ANTERIOR CERVICAL DECOMP/DISCECTOMY FUSION N/A 03/11/2012   Procedure: ANTERIOR CERVICAL DECOMPRESSION/DISCECTOMY FUSION 3 LEVELS;  Surgeon: Floyce Stakes, MD;  Location: [facility];  Service: Neurosurgery;  Laterality: N/A;  Anterior cervical decompression/fusion Cervical four-five, Cervical five-six, Cervical six-seven  . COLONOSCOPY    . EYE SURGERY     catarct bil  . KNEE ARTHROSCOPY  2007   left, right 09/2011  . SHOULDER ARTHROSCOPY  2005,2010-   [id number]-dsc    FAMILY HISTORY Family History  Problem Relation Age of Onset  . Diabetes Mother   . Colon cancer  Neg Hx   . Heart disease Neg Hx   . Esophageal cancer Neg Hx   . Pancreatic cancer Neg Hx   . Stomach cancer Neg Hx   . Rectal cancer Neg Hx     SOCIAL HISTORY Social History   Tobacco Use  . Smoking status: Never Smoker  . Smokeless tobacco: Never Used  Vaping Use  . Vaping Use: Never used  Substance Use Topics  . Alcohol use: No  . Drug use: No         OPHTHALMIC EXAM:  Base Eye Exam    Visual Acuity (ETDRS)      Right Left   Dist cc 20/20 -1 20/30   Dist ph cc  20/25   Correction: Glasses       Tonometry (Tonopen, 8:47 AM)      Right Left   Pressure 17 15       Pupils      Pupils Dark Light Shape React APD   Right PERRL 5 4 Round Brisk None   Left PERRL 5 4 Round Brisk None       Visual Fields (Counting fingers)       Left Right    Full Full       Extraocular Movement      Right Left    Full Full       Neuro/Psych    Oriented x3: Yes   Mood/Affect: Normal       Dilation    Both eyes: 1.0% Mydriacyl, 2.5% Phenylephrine @ 8:57 AM        Slit Lamp and Fundus Exam    External Exam      Right Left   External Normal Normal       Slit Lamp Exam      Right Left   Lids/Lashes Normal Normal   Conjunctiva/Sclera White and quiet White and quiet   Cornea Clear Clear   Anterior Chamber Deep and quiet Deep and quiet, minor vitreous prolapse superiorly around capsule dehiscence   Iris Round and reactive Round and reactive   Lens Centered posterior chamber intraocular lens, 3 piece 3 piece IOL in bag, decentered sunset, silicone IOL   Anterior Vitreous Normal Normal       Fundus Exam      Right Left   Posterior Vitreous Posterior vitreous detachment Posterior vitreous detachment   Disc Normal Normal   C/D Ratio 0.35 0.35   Macula Normal Normal   Vessels Normal Normal   Periphery Normal Normal          IMAGING AND PROCEDURES  Imaging and Procedures for 01/11/20  OCT, Retina - OU - Both Eyes       Right Eye Quality was good. Scan locations included subfoveal. Central Foveal Thickness: 315. Progression has no prior data. Findings include normal foveal contour.   Left Eye Quality was good. Scan locations included subfoveal. Central Foveal Thickness: 307. Progression has no prior data. Findings include normal foveal contour.                 ASSESSMENT/PLAN:  Dislocated IOL (intraocular lens), posterior, left  I discussed the structure  that normally  holds that cataract lens and the lens implant in place is similar to a "baggy", and its weakness or disruption in the process of cataract surgery, prior trauma, or aging may lead to weakened support of the lens.  The need for vitrectomy with removal of lens fragments , or intraocular implant lens, with possible  placement of a  secondary intraocular lens implant discussed.  Large cataract lens fragments may trigger inflammation which can impact the internal structures of the eye and vision.  Some instances require repositioning of initial intraocular lens implant, or possibly the removal or exchange of the initial lens implant, and replacement with a different style of lens implant that is suitable for the new eye condition.  The occurrence of lens fragments retained after cataract surgery or dislocated intraocular lens, does have the attendant risk of retinal tears and retinal detachments. These issues may be addressed in the office or operating room upon discovery during the initial surgery or recovery period.  OS old in the bag, all silicone IOL, not optimal three-piece for in the bag recent duration or externalization of haptics.  Capsule dehiscence superiorly and superonasally in the left eye shows there is not adequate capsule support for sulcus placement of lens via exchange  Left eye will require vitrectomy, removal of dislocated IOL and insertion of three-piece IOL via Yamani scleral tunnel, Zeiss CT Lucia-602  We will coordinate with Dr. Precious Bard and general ophthalmology for IOL measurements  Intraocular lens card is Chiron model CD 31 UB  Diopter +17  Left eye, surgical date 04/01/1995, in patient's possession and witnessed and recorded by Dr. Luciana Axe  Vitreous prolapse, left eye Capsule dehiscence, with minor vitreous prolapse anteriorly left eye no vitreous to corneal touch      ICD-10-CM   1. Dislocated IOL (intraocular lens), posterior, left  T85.22XA   2. Posterior vitreous detachment of both eyes  H43.813 OCT, Retina - OU - Both Eyes  3. Vitreous prolapse, left eye  H43.02     1.  OS with intraocular lens measurement we will need to look for database finding a Chiron model silicone IOL and its a constant so we can correct for the IOL power  2.  We will need to reach out general ophthalmology for  intraocular lens calculations in the left eye and this dislocated pseudophakic left eye.  3.  Risk and benefits and the technique of correction of this condition will be discussed with the patient.  Ophthalmic Meds Ordered this visit:  No orders of the defined types were placed in this encounter.      Return , [facility], [facility], lens Zeiss CT Lucia-602, for schedule vitrectomy, removal of dislocated intraocular lens left eye, insert three-piece IOL, OS.  There are no Patient Instructions on file for this visit.   Explained the diagnoses, plan, and follow up with the patient and they expressed understanding.  Patient expressed understanding of the importance of proper follow up care.   Alford Highland Aysha Livecchi M.D. Diseases & Surgery of the Retina and Vitreous [facility] 01/11/20     Abbreviations: M myopia (nearsighted); A astigmatism; H hyperopia (farsighted); P presbyopia; Mrx spectacle prescription;  CTL contact lenses; OD right eye; OS left eye; OU both eyes  XT exotropia; ET esotropia; PEK punctate epithelial keratitis; PEE punctate epithelial erosions; DES dry eye syndrome; MGD meibomian gland dysfunction; ATs artificial tears; PFAT's preservative free artificial tears; NSC nuclear sclerotic cataract; PSC posterior subcapsular cataract; ERM epi-retinal membrane; PVD posterior vitreous detachment; RD retinal detachment; DM diabetes mellitus; DR diabetic retinopathy; NPDR non-proliferative diabetic retinopathy; PDR proliferative diabetic retinopathy; CSME clinically significant macular edema; DME diabetic macular edema; dbh dot blot hemorrhages; CWS cotton wool spot; POAG primary open angle glaucoma; C/D cup-to-disc ratio; HVF humphrey visual field; GVF goldmann visual field; OCT optical coherence tomography;  IOP intraocular pressure; BRVO Branch retinal vein occlusion; CRVO central retinal vein occlusion; CRAO central retinal artery occlusion; BRAO branch retinal artery  occlusion; RT retinal tear; SB scleral buckle; PPV pars plana vitrectomy; VH Vitreous hemorrhage; PRP panretinal laser photocoagulation; IVK intravitreal kenalog; VMT vitreomacular traction; MH Macular hole;  NVD neovascularization of the disc; NVE neovascularization elsewhere; AREDS age related eye disease study; ARMD age related macular degeneration; POAG primary open angle glaucoma; EBMD epithelial/anterior basement membrane dystrophy; ACIOL anterior chamber intraocular lens; IOL intraocular lens; PCIOL posterior chamber intraocular lens; Phaco/IOL phacoemulsification with intraocular lens placement; PRK photorefractive keratectomy; LASIK laser assisted in situ keratomileusis; HTN hypertension; DM diabetes mellitus; COPD chronic obstructive pulmonary disease

## 2020-01-11 NOTE — Assessment & Plan Note (Signed)
I discussed the structure  that normally  holds that cataract lens and the lens implant in place is similar to a "baggy", and its weakness or disruption in the process of cataract surgery, prior trauma, or aging may lead to weakened support of the lens.  The need for vitrectomy with removal of lens fragments , or intraocular implant lens, with possible placement of a secondary intraocular lens implant discussed.  Large cataract lens fragments may trigger inflammation which can impact the internal structures of the eye and vision.  Some instances require repositioning of initial intraocular lens implant, or possibly the removal or exchange of the initial lens implant, and replacement with a different style of lens implant that is suitable for the new eye condition.  The occurrence of lens fragments retained after cataract surgery or dislocated intraocular lens, does have the attendant risk of retinal tears and retinal detachments. These issues may be addressed in the office or operating room upon discovery during the initial surgery or recovery period.  OS old in the bag, all silicone IOL, not optimal three-piece for in the bag recent duration or externalization of haptics.  Capsule dehiscence superiorly and superonasally in the left eye shows there is not adequate capsule support for sulcus placement of lens via exchange  Left eye will require vitrectomy, removal of dislocated IOL and insertion of three-piece IOL via Yamani scleral tunnel, Zeiss CT Lucia-602  We will coordinate with Dr. Precious Bard and general ophthalmology for IOL measurements  Intraocular lens card is Chiron model CD 31 UB  Diopter +17  Left eye, surgical date 04/01/1995, in patient's possession and witnessed and recorded by Dr. Luciana Axe

## 2020-01-19 ENCOUNTER — Other Ambulatory Visit: Payer: Self-pay

## 2020-01-19 ENCOUNTER — Encounter (INDEPENDENT_AMBULATORY_CARE_PROVIDER_SITE_OTHER): Payer: Self-pay

## 2020-01-19 ENCOUNTER — Ambulatory Visit (INDEPENDENT_AMBULATORY_CARE_PROVIDER_SITE_OTHER): Payer: PPO

## 2020-01-19 DIAGNOSIS — T8522XA Displacement of intraocular lens, initial encounter: Secondary | ICD-10-CM

## 2020-01-19 MED ORDER — PREDNISOLONE ACETATE 1 % OP SUSP: 1.0000 [drp] | Freq: Four times a day (QID) | OPHTHALMIC | 0 refills | Status: DC

## 2020-01-19 MED ORDER — OFLOXACIN 0.3 % OP SOLN: 1.0000 [drp] | Freq: Four times a day (QID) | OPHTHALMIC | 0 refills | Status: AC

## 2020-02-02 ENCOUNTER — Encounter (AMBULATORY_SURGERY_CENTER): Payer: PPO | Admitting: Ophthalmology

## 2020-02-02 DIAGNOSIS — H2702 Aphakia, left eye: Secondary | ICD-10-CM | POA: Diagnosis not present

## 2020-02-02 DIAGNOSIS — T8522XA Displacement of intraocular lens, initial encounter: Secondary | ICD-10-CM

## 2020-02-02 DIAGNOSIS — H4302 Vitreous prolapse, left eye: Secondary | ICD-10-CM | POA: Diagnosis not present

## 2020-02-03 ENCOUNTER — Other Ambulatory Visit: Payer: Self-pay

## 2020-02-03 ENCOUNTER — Encounter (INDEPENDENT_AMBULATORY_CARE_PROVIDER_SITE_OTHER): Payer: Self-pay | Admitting: Ophthalmology

## 2020-02-03 ENCOUNTER — Ambulatory Visit (INDEPENDENT_AMBULATORY_CARE_PROVIDER_SITE_OTHER): Payer: PPO | Admitting: Ophthalmology

## 2020-02-03 DIAGNOSIS — Z09 Encounter for follow-up examination after completed treatment for conditions other than malignant neoplasm: Secondary | ICD-10-CM | POA: Insufficient documentation

## 2020-02-03 DIAGNOSIS — H2702 Aphakia, left eye: Secondary | ICD-10-CM

## 2020-02-03 DIAGNOSIS — T8522XA Displacement of intraocular lens, initial encounter: Secondary | ICD-10-CM

## 2020-02-03 DIAGNOSIS — H4302 Vitreous prolapse, left eye: Secondary | ICD-10-CM

## 2020-02-03 HISTORY — DX: Aphakia, left eye: H27.02

## 2020-02-03 NOTE — Assessment & Plan Note (Signed)
No lifting and bending for 1 week. No water in the eye for 10 days. Do not rub the eye. Wear shield at night for 1-3 days.  Wear your CPAP as normal, if instructed by your doctor.  Continue your topical medications for a total of 3 weeks.  Do not refill your postoperative medications unless instructed.   Ofloxacin  4 times daily to the operative eye  Prednisolone acetate 1 drop to the operative eye 4 times daily  Patient instructed not to refill the medications and use them for maximum of 3 weeks.  Patient instructed do not rub the eye.  Patient has the option to use the patch at night. 

## 2020-02-03 NOTE — Progress Notes (Signed)
02/03/2020     CHIEF COMPLAINT Patient presents for Post-op Follow-up (1 Day POV OS s/p PPV/removal IOL/insertion secondary IOL OS//Pt c/o watering OS. Pt sts he had throbbing pain and burning last night, but it went away with Tylenol. Pt denies ocular pain currently.)   HISTORY OF PRESENT ILLNESS: Albert Benjamin is a 70 y.o. male who presents to the clinic today for:   HPI    Post-op Follow-up    In left eye.  Discomfort includes tearing. Additional comments: 1 Day POV OS s/p PPV/removal IOL/insertion secondary IOL OS  Pt c/o watering OS. Pt sts he had throbbing pain and burning last night, but it went away with Tylenol. Pt denies ocular pain currently.       Last edited by Ileana Roup, COA on 02/03/2020  8:49 AM. (History)      Referring physician: Leonia Reader, Barbara Cower, MD [id number] [place],  [place] [address]  HISTORICAL INFORMATION:   Selected notes from the MEDICAL RECORD NUMBER       CURRENT MEDICATIONS: Current Outpatient Medications (Ophthalmic Drugs)  Medication Sig  . ofloxacin (OCUFLOX) 0.3 % ophthalmic solution Place 1 drop into the left eye 4 (four) times daily for 21 days.  . prednisoLONE acetate (PRED FORTE) 1 % ophthalmic suspension Place 1 drop into the left eye 4 (four) times daily.   No current facility-administered medications for this visit. (Ophthalmic Drugs)   Current Outpatient Medications (Other)  Medication Sig  . cetirizine (ZYRTEC) 10 MG tablet Take 10 mg by mouth daily.  . citalopram (CELEXA) 40 MG tablet Take 40 mg by mouth daily.  Marland Kitchen lisinopril (ZESTRIL) 2.5 MG tablet Take 2.5 mg by mouth daily.  . montelukast (SINGULAIR) 10 MG tablet Take 10 mg by mouth at bedtime.  . pioglitazone (ACTOS) 15 MG tablet Take 15 mg by mouth daily.  . pravastatin (PRAVACHOL) 40 MG tablet Take 40 mg by mouth daily.   No current facility-administered medications for this visit. (Other)      REVIEW OF SYSTEMS:    ALLERGIES No Known Allergies  PAST  MEDICAL HISTORY Past Medical History:  Diagnosis Date  . Allergy   . Arthritis   . Cataract    bilkateral surgery 3-40 years ago  . Depression   . Diabetes mellitus   . Hypertension   . Obstructive sleep apnea on CPAP   . Sleep apnea    does not use CPAP  . Wears glasses   . Wears hearing aid    both ears   Past Surgical History:  Procedure Laterality Date  . ANTERIOR CERVICAL DECOMP/DISCECTOMY FUSION N/A 03/11/2012   Procedure: ANTERIOR CERVICAL DECOMPRESSION/DISCECTOMY FUSION 3 LEVELS;  Surgeon: Karn Cassis, MD;  Location: [facility];  Service: Neurosurgery;  Laterality: N/A;  Anterior cervical decompression/fusion Cervical four-five, Cervical five-six, Cervical six-seven  . COLONOSCOPY    . EYE SURGERY     catarct bil  . KNEE ARTHROSCOPY  2007   left, right 09/2011  . SHOULDER ARTHROSCOPY  2005,2010-   rightx2-dsc    FAMILY HISTORY Family History  Problem Relation Age of Onset  . Diabetes Mother   . Colon cancer Neg Hx   . Heart disease Neg Hx   . Esophageal cancer Neg Hx   . Pancreatic cancer Neg Hx   . Stomach cancer Neg Hx   . Rectal cancer Neg Hx     SOCIAL HISTORY Social History   Tobacco Use  . Smoking status: Never  Smoker  . Smokeless tobacco: Never Used  Vaping Use  . Vaping Use: Never used  Substance Use Topics  . Alcohol use: No  . Drug use: No         OPHTHALMIC EXAM:  Base Eye Exam    Visual Acuity (ETDRS)      Right Left   Dist [place] 20/25 -1 20/200   Dist ph [place]  NI       Tonometry (Tonopen, 8:49 AM)      Right Left   Pressure 17 20       Pupils      Dark Light Shape React APD   Right 4 3 Round Brisk None   Left 8 8 Round Dilated None       Neuro/Psych    Oriented x3: Yes   Mood/Affect: Normal        Slit Lamp and Fundus Exam    External Exam      Right Left   External Normal Normal       Slit Lamp Exam      Right Left   Lids/Lashes Normal Normal   Conjunctiva/Sclera White and quiet 1+ Subconjunctival  hemorrhage, no exposed haptics   Cornea Clear Clear, interrupted 10-0 nylon superiorly 3 and separate location superior temporal well-closed   Anterior Chamber Deep and quiet Deep and quiet, minor vitreous prolapse superiorly around capsule dehiscence   Iris Round and reactive Round and reactive   Lens Centered posterior chamber intraocular lens, 3 piece Well centered Zeiss PCIOL, CT Lucia   Anterior Vitreous Normal Normal       Fundus Exam      Right Left   Posterior Vitreous  Trace haze, dispersed RBC,   Disc  Normal   C/D Ratio  0.35   Macula  Hazy detail   Vessels  Normal   Periphery  Normal, a attached peripherally.          IMAGING AND PROCEDURES  Imaging and Procedures for 02/03/20           ASSESSMENT/PLAN:  Postoperative follow-up No lifting and bending for 1 week. No water in the eye for 10 days. Do not rub the eye. Wear shield at night for 1-3 days.  Wear your CPAP as normal, if instructed by your doctor.  Continue your topical medications for a total of 3 weeks.  Do not refill your postoperative medications unless instructed.   Ofloxacin  4 times daily to the operative eye  Prednisolone acetate 1 drop to the operative eye 4 times daily  Patient instructed not to refill the medications and use them for maximum of 3 weeks.  Patient instructed do not rub the eye.  Patient has the option to use the patch at night.  Dislocated IOL (intraocular lens), posterior, left Surgically resolved via vitrectomy removal of lens implant 02/02/2020 left eye  Vitreous prolapse, left eye Surgically resolved at the time of implant removal via vitrectomy      ICD-10-CM   1. Postoperative follow-up  Z09   2. Dislocated IOL (intraocular lens), posterior, left  T85.22XA   3. Vitreous prolapse, left eye  H43.02   4. Aphakia, left eye  H27.02     1. OS looks great first day postoperative. Excellent centration no obvious tilt of scleral tunnel fixated posterior chamber  intraocular lens left eye  2. Review of limitations of activity with the patient were was under taken extensively  3.  Ophthalmic Meds Ordered this visit:  No orders of  the defined types were placed in this encounter.      Return in about 1 week (around 02/10/2020) for dilate, OS, POST OP, COLOR FP.  There are no Patient Instructions on file for this visit.   Explained the diagnoses, plan, and follow up with the patient and they expressed understanding.  Patient expressed understanding of the importance of proper follow up care.   Alford Highland Girtie Wiersma M.D. Diseases & Surgery of the Retina and Vitreous [facility] 02/03/20     Abbreviations: M myopia (nearsighted); A astigmatism; H hyperopia (farsighted); P presbyopia; Mrx spectacle prescription;  CTL contact lenses; OD right eye; OS left eye; OU both eyes  XT exotropia; ET esotropia; PEK punctate epithelial keratitis; PEE punctate epithelial erosions; DES dry eye syndrome; MGD meibomian gland dysfunction; ATs artificial tears; PFAT's preservative free artificial tears; NSC nuclear sclerotic cataract; PSC posterior subcapsular cataract; ERM epi-retinal membrane; PVD posterior vitreous detachment; RD retinal detachment; DM diabetes mellitus; DR diabetic retinopathy; NPDR non-proliferative diabetic retinopathy; PDR proliferative diabetic retinopathy; CSME clinically significant macular edema; DME diabetic macular edema; dbh dot blot hemorrhages; CWS cotton wool spot; POAG primary open angle glaucoma; C/D cup-to-disc ratio; HVF humphrey visual field; GVF goldmann visual field; OCT optical coherence tomography; IOP intraocular pressure; BRVO Branch retinal vein occlusion; CRVO central retinal vein occlusion; CRAO central retinal artery occlusion; BRAO branch retinal artery occlusion; RT retinal tear; SB scleral buckle; PPV pars plana vitrectomy; VH Vitreous hemorrhage; PRP panretinal laser photocoagulation; IVK intravitreal kenalog;  VMT vitreomacular traction; MH Macular hole;  NVD neovascularization of the disc; NVE neovascularization elsewhere; AREDS age related eye disease study; ARMD age related macular degeneration; POAG primary open angle glaucoma; EBMD epithelial/anterior basement membrane dystrophy; ACIOL anterior chamber intraocular lens; IOL intraocular lens; PCIOL posterior chamber intraocular lens; Phaco/IOL phacoemulsification with intraocular lens placement; PRK photorefractive keratectomy; LASIK laser assisted in situ keratomileusis; HTN hypertension; DM diabetes mellitus; COPD chronic obstructive pulmonary disease

## 2020-02-03 NOTE — Assessment & Plan Note (Signed)
Surgically resolved via vitrectomy removal of lens implant 02/02/2020 left eye

## 2020-02-03 NOTE — Assessment & Plan Note (Signed)
Surgically resolved at the time of implant removal via vitrectomy

## 2020-02-10 ENCOUNTER — Other Ambulatory Visit: Payer: Self-pay

## 2020-02-10 ENCOUNTER — Encounter (INDEPENDENT_AMBULATORY_CARE_PROVIDER_SITE_OTHER): Payer: Self-pay | Admitting: Ophthalmology

## 2020-02-10 ENCOUNTER — Ambulatory Visit (INDEPENDENT_AMBULATORY_CARE_PROVIDER_SITE_OTHER): Payer: PPO | Admitting: Ophthalmology

## 2020-02-10 DIAGNOSIS — H4302 Vitreous prolapse, left eye: Secondary | ICD-10-CM

## 2020-02-10 DIAGNOSIS — T8522XA Displacement of intraocular lens, initial encounter: Secondary | ICD-10-CM

## 2020-02-10 DIAGNOSIS — Z09 Encounter for follow-up examination after completed treatment for conditions other than malignant neoplasm: Secondary | ICD-10-CM

## 2020-02-10 DIAGNOSIS — H2702 Aphakia, left eye: Secondary | ICD-10-CM

## 2020-02-10 NOTE — Patient Instructions (Signed)
No lifting and bending for 1 week. No water in the eye for 10 days. Do not rub the eye. Wear shield at night for 1-3 days.  Wear your CPAP as normal, if instructed by your doctor.  Continue your topical medications for a total of 3 weeks.  Do not refill your postoperative medications unless instructed.  OS, looks great 1 week postop with improving acuity.

## 2020-02-10 NOTE — Assessment & Plan Note (Signed)
Condition resolved with insertion Yamani scleral tunnel three-piece IOL left eye, improving acuity

## 2020-02-10 NOTE — Assessment & Plan Note (Signed)
Surgical cart repair 02-02-20

## 2020-02-10 NOTE — Progress Notes (Signed)
02/10/2020     CHIEF COMPLAINT Patient presents for Post-op Follow-up (1 WK PO OS, SX DATE 02/02/20////Pt reports vision slightly improved OS. Pt reports there had been a discharge that was almost like plastic coming out of OS, up until the end of last week. Pt denies any pain or pressure, F/F. /)   HISTORY OF PRESENT ILLNESS: Albert Benjamin is a 70 y.o. male who presents to the clinic today for:   HPI    Post-op Follow-up    In left eye. Additional comments: 1 WK PO OS, SX DATE 02/02/20    Pt reports vision slightly improved OS. Pt reports there had been a discharge that was almost like plastic coming out of OS, up until the end of last week. Pt denies any pain or pressure, F/F.            Comments    1 week postop, vitrectomy, removal of very thick dislocated silicone IOL three-piece, friable haptics.  Also status post placement of three-piece IOL, Zeiss CT Jarratt, [place] scleral tunnel left eye  Acuity improving this week       Last edited by Edmon Crape, MD on 02/10/2020 10:05 AM. (History)      Referring physician: Leonia Reader, Barbara Cower, MD [address] [place],  [place] [address]  HISTORICAL INFORMATION:   Selected notes from the MEDICAL RECORD NUMBER       CURRENT MEDICATIONS: Current Outpatient Medications (Ophthalmic Drugs)  Medication Sig   ofloxacin (OCUFLOX) 0.3 % ophthalmic solution Place 1 drop into the left eye 4 (four) times daily for 21 days.   prednisoLONE acetate (PRED FORTE) 1 % ophthalmic suspension Place 1 drop into the left eye 4 (four) times daily.   No current facility-administered medications for this visit. (Ophthalmic Drugs)   Current Outpatient Medications (Other)  Medication Sig   cetirizine (ZYRTEC) 10 MG tablet Take 10 mg by mouth daily.   citalopram (CELEXA) 40 MG tablet Take 40 mg by mouth daily.   lisinopril (ZESTRIL) 2.5 MG tablet Take 2.5 mg by mouth daily.   montelukast (SINGULAIR) 10 MG tablet Take 10 mg by mouth at bedtime.    pioglitazone (ACTOS) 15 MG tablet Take 15 mg by mouth daily.   pravastatin (PRAVACHOL) 40 MG tablet Take 40 mg by mouth daily.   No current facility-administered medications for this visit. (Other)      REVIEW OF SYSTEMS:    ALLERGIES No Known Allergies  PAST MEDICAL HISTORY Past Medical History:  Diagnosis Date   Allergy    Aphakia, left eye 02/03/2020   Secondary insertion of posterior chamber intraocular lens via Yamani scleral tunnel technique 02/02/2020 left eye   Arthritis    Cataract    bilkateral surgery 3-40 years ago   Depression    Diabetes mellitus    Hypertension    Obstructive sleep apnea on CPAP    Sleep apnea    does not use CPAP   Wears glasses    Wears hearing aid    both ears   Past Surgical History:  Procedure Laterality Date   ANTERIOR CERVICAL DECOMP/DISCECTOMY FUSION N/A 03/11/2012   Procedure: ANTERIOR CERVICAL DECOMPRESSION/DISCECTOMY FUSION 3 LEVELS;  Surgeon: Karn Cassis, MD;  Location: [facility];  Service: Neurosurgery;  Laterality: N/A;  Anterior cervical decompression/fusion Cervical four-five, Cervical five-six, Cervical six-seven   COLONOSCOPY     EYE SURGERY     catarct bil   KNEE ARTHROSCOPY  2007  left, right 09/2011   SHOULDER ARTHROSCOPY  2005,2010-   rightx2-dsc    FAMILY HISTORY Family History  Problem Relation Age of Onset   Diabetes Mother    Colon cancer Neg Hx    Heart disease Neg Hx    Esophageal cancer Neg Hx    Pancreatic cancer Neg Hx    Stomach cancer Neg Hx    Rectal cancer Neg Hx     SOCIAL HISTORY Social History   Tobacco Use   Smoking status: Never Smoker   Smokeless tobacco: Never Used  Vaping Use   Vaping Use: Never used  Substance Use Topics   Alcohol use: No   Drug use: No         OPHTHALMIC EXAM: Base Eye Exam    Visual Acuity (ETDRS)      Right Left   Dist [place] 20/30 -2 20/70   Dist ph  20/20 -2 20/40       Tonometry (Tonopen, 8:44 AM)       Right Left   Pressure 16 10       Pupils      Dark Light Shape React APD   Right 4 3 Round Brisk None   Left 4 3 Round Brisk None       Visual Fields (Counting fingers)      Left Right    Full Full       Extraocular Movement      Right Left    Full Full       Neuro/Psych    Oriented x3: Yes   Mood/Affect: Normal       Dilation    Left eye: 1.0% Mydriacyl, 2.5% Phenylephrine @ 8:44 AM        Slit Lamp and Fundus Exam    External Exam      Right Left   External Normal Normal       Slit Lamp Exam      Right Left   Lids/Lashes Normal Normal   Conjunctiva/Sclera White and quiet Trace subconjunctival hemorrhage, no exposed haptics   Cornea Clear Clear, interrupted 10-0 nylon superiorly 3 and separate location superior temporal well-closed   Anterior Chamber Deep and quiet Deep and quiet, minor vitreous prolapse superiorly around capsule dehiscence   Iris Round and reactive Round and reactive, no pi   Lens Centered posterior chamber intraocular lens, 3 piece Well centered Zeiss PCIOL, CT Lucia   Anterior Vitreous Normal Normal       Fundus Exam      Right Left   Posterior Vitreous  Clear, avitric   Disc  Normal   C/D Ratio  0.35   Macula  Normal   Vessels  Normal   Periphery  Normal,  attached peripherally.          IMAGING AND PROCEDURES  Imaging and Procedures for 02/10/20  Color Fundus Photography Optos - OU - Both Eyes       Right Eye Progression has been stable. Disc findings include normal observations. Macula : normal observations. Vessels : normal observations.   Left Eye Progression has improved. Disc findings include normal observations. Macula : normal observations. Periphery : normal observations.   Notes Clear media, dislocated intraocular lens resolved, 1 week post vitrectomy removal of dislocated light IOL                ASSESSMENT/PLAN:  Vitreous prolapse, left eye Surgical cart repair 02-02-20  Aphakia, left  eye Condition resolved with insertion Yamani scleral tunnel  three-piece IOL left eye, improving acuity      ICD-10-CM   1. Postoperative follow-up  Z09 Color Fundus Photography Optos - OU - Both Eyes  2. Dislocated IOL (intraocular lens), posterior, left  T85.22XA   3. Vitreous prolapse, left eye  H43.02   4. Aphakia, left eye  H27.02     1.  Patient instructed to complete his topical eye medications over the next 2 weeks.  Patient is instructed not to refill the medications.  2.  Patient will be instructed to follow-up with Dr. Coralyn Pear in [place], for refractive assistance.  3.  The patient may schedule follow-up with Dr. Precious Bard in 4 weeks Patient instructed to return to full activity and 10 more days.  Ophthalmic Meds Ordered this visit:  No orders of the defined types were placed in this encounter.      Return in about 3 weeks (around 03/02/2020) for OCT, OS, POST OP.  There are no Patient Instructions on file for this visit.   Explained the diagnoses, plan, and follow up with the patient and they expressed understanding.  Patient expressed understanding of the importance of proper follow up care.   Alford Highland Dois Juarbe M.D. Diseases & Surgery of the Retina and Vitreous [facility] 02/10/20     Abbreviations: M myopia (nearsighted); A astigmatism; H hyperopia (farsighted); P presbyopia; Mrx spectacle prescription;  CTL contact lenses; OD right eye; OS left eye; OU both eyes  XT exotropia; ET esotropia; PEK punctate epithelial keratitis; PEE punctate epithelial erosions; DES dry eye syndrome; MGD meibomian gland dysfunction; ATs artificial tears; PFAT's preservative free artificial tears; NSC nuclear sclerotic cataract; PSC posterior subcapsular cataract; ERM epi-retinal membrane; PVD posterior vitreous detachment; RD retinal detachment; DM diabetes mellitus; DR diabetic retinopathy; NPDR non-proliferative diabetic retinopathy; PDR proliferative diabetic  retinopathy; CSME clinically significant macular edema; DME diabetic macular edema; dbh dot blot hemorrhages; CWS cotton wool spot; POAG primary open angle glaucoma; C/D cup-to-disc ratio; HVF humphrey visual field; GVF goldmann visual field; OCT optical coherence tomography; IOP intraocular pressure; BRVO Branch retinal vein occlusion; CRVO central retinal vein occlusion; CRAO central retinal artery occlusion; BRAO branch retinal artery occlusion; RT retinal tear; SB scleral buckle; PPV pars plana vitrectomy; VH Vitreous hemorrhage; PRP panretinal laser photocoagulation; IVK intravitreal kenalog; VMT vitreomacular traction; MH Macular hole;  NVD neovascularization of the disc; NVE neovascularization elsewhere; AREDS age related eye disease study; ARMD age related macular degeneration; POAG primary open angle glaucoma; EBMD epithelial/anterior basement membrane dystrophy; ACIOL anterior chamber intraocular lens; IOL intraocular lens; PCIOL posterior chamber intraocular lens; Phaco/IOL phacoemulsification with intraocular lens placement; PRK photorefractive keratectomy; LASIK laser assisted in situ keratomileusis; HTN hypertension; DM diabetes mellitus; COPD chronic obstructive pulmonary disease

## 2020-02-29 ENCOUNTER — Other Ambulatory Visit: Payer: Self-pay

## 2020-02-29 ENCOUNTER — Encounter (INDEPENDENT_AMBULATORY_CARE_PROVIDER_SITE_OTHER): Payer: Self-pay | Admitting: Ophthalmology

## 2020-02-29 ENCOUNTER — Ambulatory Visit (INDEPENDENT_AMBULATORY_CARE_PROVIDER_SITE_OTHER): Payer: PPO | Admitting: Ophthalmology

## 2020-02-29 DIAGNOSIS — H35352 Cystoid macular degeneration, left eye: Secondary | ICD-10-CM

## 2020-02-29 DIAGNOSIS — T8522XA Displacement of intraocular lens, initial encounter: Secondary | ICD-10-CM

## 2020-02-29 DIAGNOSIS — H4302 Vitreous prolapse, left eye: Secondary | ICD-10-CM

## 2020-02-29 MED ORDER — KETOROLAC TROMETHAMINE 0.5 % OP SOLN: 1.0000 [drp] | Freq: Four times a day (QID) | OPHTHALMIC | 0 refills | Status: DC

## 2020-02-29 NOTE — Progress Notes (Signed)
02/29/2020     CHIEF COMPLAINT Patient presents for Post-op Follow-up (5 Week s\p OS. OCT/Pt c/o OS vision being blurry, near and distance. Pt also states he has a discharge from OS that seems like small plastic pieces. Pt is still using gtts in OS.)   HISTORY OF PRESENT ILLNESS: Albert Benjamin is a 70 y.o. male who presents to the clinic today for:   HPI    Post-op Follow-up    In left eye.  Discomfort includes discharge.  Vision is blurred at distance and is blurred at near.  I, the attending physician,  performed the HPI with the patient and updated documentation appropriately. Additional comments: 5 Week s\p OS. OCT Pt c/o OS vision being blurry, near and distance. Pt also states he has a discharge from OS that seems like small plastic pieces. Pt is still using gtts in OS.       Last edited by Elyse Jarvis on 02/29/2020  8:36 AM. (History)      Referring physician: Crist Fat, MD [address] [place],  [place] [address]  HISTORICAL INFORMATION:   Selected notes from the MEDICAL RECORD NUMBER       CURRENT MEDICATIONS: Current Outpatient Medications (Ophthalmic Drugs)  Medication Sig  . ketorolac (ACULAR) 0.5 % ophthalmic solution Place 1 drop into the left eye 4 (four) times daily.   No current facility-administered medications for this visit. (Ophthalmic Drugs)   Current Outpatient Medications (Other)  Medication Sig  . cetirizine (ZYRTEC) 10 MG tablet Take 10 mg by mouth daily.  . citalopram (CELEXA) 40 MG tablet Take 40 mg by mouth daily.  Marland Kitchen lisinopril (ZESTRIL) 2.5 MG tablet Take 2.5 mg by mouth daily.  . montelukast (SINGULAIR) 10 MG tablet Take 10 mg by mouth at bedtime.  . pioglitazone (ACTOS) 15 MG tablet Take 15 mg by mouth daily.  . pravastatin (PRAVACHOL) 40 MG tablet Take 40 mg by mouth daily.   No current facility-administered medications for this visit. (Other)      REVIEW OF SYSTEMS:    ALLERGIES No Known Allergies  PAST MEDICAL  HISTORY Past Medical History:  Diagnosis Date  . Allergy   . Aphakia, left eye 02/03/2020   Secondary insertion of posterior chamber intraocular lens via Yamani scleral tunnel technique 02/02/2020 left eye  . Arthritis   . Cataract    bilkateral surgery 3-40 years ago  . Depression   . Diabetes mellitus   . Hypertension   . Obstructive sleep apnea on CPAP   . Sleep apnea    does not use CPAP  . Wears glasses   . Wears hearing aid    both ears   Past Surgical History:  Procedure Laterality Date  . ANTERIOR CERVICAL DECOMP/DISCECTOMY FUSION N/A 03/11/2012   Procedure: ANTERIOR CERVICAL DECOMPRESSION/DISCECTOMY FUSION 3 LEVELS;  Surgeon: Karn Cassis, MD;  Location: [facility];  Service: Neurosurgery;  Laterality: N/A;  Anterior cervical decompression/fusion Cervical four-five, Cervical five-six, Cervical six-seven  . COLONOSCOPY    . EYE SURGERY     catarct bil  . KNEE ARTHROSCOPY  2007   left, right 09/2011  . SHOULDER ARTHROSCOPY  2005,2010-   rightx2-dsc    FAMILY HISTORY Family History  Problem Relation Age of Onset  . Diabetes Mother   . Colon cancer Neg Hx   . Heart disease Neg Hx   . Esophageal cancer Neg Hx   . Pancreatic cancer Neg Hx   . Stomach  cancer Neg Hx   . Rectal cancer Neg Hx     SOCIAL HISTORY Social History   Tobacco Use  . Smoking status: Never Smoker  . Smokeless tobacco: Never Used  Vaping Use  . Vaping Use: Never used  Substance Use Topics  . Alcohol use: No  . Drug use: No         OPHTHALMIC EXAM:  Base Eye Exam    Visual Acuity (Snellen - Linear)      Right Left   Dist cc 20/25 + 20/200   Dist ph cc  NI   Correction: Glasses       Tonometry (Tonopen, 8:41 AM)      Right Left   Pressure 16 16       Pupils      Pupils Dark Light Shape React APD   Right PERRL 4 3 Round Brisk None   Left PERRL 4 3 Round Brisk None       Visual Fields (Counting fingers)      Left Right    Full Full       Neuro/Psych    Oriented  x3: Yes   Mood/Affect: Normal       Dilation    Left eye: 1.0% Mydriacyl, 2.5% Phenylephrine @ 8:41 AM        Slit Lamp and Fundus Exam    External Exam      Right Left   External Normal Normal       Slit Lamp Exam      Right Left   Lids/Lashes Normal Normal   Conjunctiva/Sclera White and quiet Trace subconjunctival hemorrhage, no exposed haptics   Cornea Clear Clear, interrupted 10-0 nylon superiorly 3 and separate location superior temporal well-closed   Anterior Chamber Deep and quiet Deep and quiet, no pigment   Iris Round and reactive Round and reactive, no pi   Lens Centered posterior chamber intraocular lens, 3 piece Well centered Zeiss PCIOL, CT Lucia   Anterior Vitreous Normal Normal       Fundus Exam      Right Left   Posterior Vitreous  Clear, avitric   Disc  Normal   C/D Ratio  0.35   Macula  Cystoid macular edema   Vessels  Normal   Periphery  Normal,  attached peripherally.          IMAGING AND PROCEDURES  Imaging and Procedures for 02/29/20  OCT, Retina - OU - Both Eyes       Right Eye Quality was good. Scan locations included subfoveal. Central Foveal Thickness: 312. Progression has been stable. Findings include normal foveal contour.   Left Eye Quality was good. Scan locations included subfoveal. Central Foveal Thickness: 614. Findings include abnormal foveal contour, cystoid macular edema.   Notes CME left eye new,                ASSESSMENT/PLAN:  Cystoid macular edema of left eye Cystoid macular edema left eye on the basis of scleral tunnel fixated intraocular lens, potential for iris IOL chafe.  Will discontinue topical steroid as this is developed while using steroid.  We will commence with topical nonsteroidal, ketorolac 1 drop left eye 4 times daily initially.  Goal 1 day to diminish this and decrease this down to twice a day if not discontinue it  Dislocated IOL (intraocular lens), posterior, left OS, anatomically looks  to right.  No obvious chafe of IOL, no pigment dispersion the anterior chamber  ICD-10-CM   1. Vitreous prolapse, left eye  H43.02 OCT, Retina - OU - Both Eyes  2. Dislocated IOL (intraocular lens), posterior, left  T85.22XA OCT, Retina - OU - Both Eyes  3. Cystoid macular edema of left eye  H35.352     1.  2.  3.  Ophthalmic Meds Ordered this visit:  Meds ordered this encounter  Medications  . ketorolac (ACULAR) 0.5 % ophthalmic solution    Sig: Place 1 drop into the left eye 4 (four) times daily.    Dispense:  5 mL    Refill:  0       Return in about 5 weeks (around 04/04/2020) for OCT, NO DILATE.  There are no Patient Instructions on file for this visit.   Explained the diagnoses, plan, and follow up with the patient and they expressed understanding.  Patient expressed understanding of the importance of proper follow up care.   Alford Highland Rankin M.D. Diseases & Surgery of the Retina and Vitreous [facility] 02/29/20     Abbreviations: M myopia (nearsighted); A astigmatism; H hyperopia (farsighted); P presbyopia; Mrx spectacle prescription;  CTL contact lenses; OD right eye; OS left eye; OU both eyes  XT exotropia; ET esotropia; PEK punctate epithelial keratitis; PEE punctate epithelial erosions; DES dry eye syndrome; MGD meibomian gland dysfunction; ATs artificial tears; PFAT's preservative free artificial tears; NSC nuclear sclerotic cataract; PSC posterior subcapsular cataract; ERM epi-retinal membrane; PVD posterior vitreous detachment; RD retinal detachment; DM diabetes mellitus; DR diabetic retinopathy; NPDR non-proliferative diabetic retinopathy; PDR proliferative diabetic retinopathy; CSME clinically significant macular edema; DME diabetic macular edema; dbh dot blot hemorrhages; CWS cotton wool spot; POAG primary open angle glaucoma; C/D cup-to-disc ratio; HVF humphrey visual field; GVF goldmann visual field; OCT optical coherence tomography;  IOP intraocular pressure; BRVO Branch retinal vein occlusion; CRVO central retinal vein occlusion; CRAO central retinal artery occlusion; BRAO branch retinal artery occlusion; RT retinal tear; SB scleral buckle; PPV pars plana vitrectomy; VH Vitreous hemorrhage; PRP panretinal laser photocoagulation; IVK intravitreal kenalog; VMT vitreomacular traction; MH Macular hole;  NVD neovascularization of the disc; NVE neovascularization elsewhere; AREDS age related eye disease study; ARMD age related macular degeneration; POAG primary open angle glaucoma; EBMD epithelial/anterior basement membrane dystrophy; ACIOL anterior chamber intraocular lens; IOL intraocular lens; PCIOL posterior chamber intraocular lens; Phaco/IOL phacoemulsification with intraocular lens placement; PRK photorefractive keratectomy; LASIK laser assisted in situ keratomileusis; HTN hypertension; DM diabetes mellitus; COPD chronic obstructive pulmonary disease

## 2020-02-29 NOTE — Assessment & Plan Note (Signed)
Cystoid macular edema left eye on the basis of scleral tunnel fixated intraocular lens, potential for iris IOL chafe.  Will discontinue topical steroid as this is developed while using steroid.  We will commence with topical nonsteroidal, ketorolac 1 drop left eye 4 times daily initially.  Goal 1 day to diminish this and decrease this down to twice a day if not discontinue it

## 2020-02-29 NOTE — Assessment & Plan Note (Signed)
OS, anatomically looks to right.  No obvious chafe of IOL, no pigment dispersion the anterior chamber

## 2020-03-02 ENCOUNTER — Encounter (INDEPENDENT_AMBULATORY_CARE_PROVIDER_SITE_OTHER): Payer: PPO | Admitting: Ophthalmology

## 2020-04-05 ENCOUNTER — Encounter (INDEPENDENT_AMBULATORY_CARE_PROVIDER_SITE_OTHER): Payer: Self-pay | Admitting: Ophthalmology

## 2020-04-05 ENCOUNTER — Other Ambulatory Visit: Payer: Self-pay

## 2020-04-05 ENCOUNTER — Ambulatory Visit (INDEPENDENT_AMBULATORY_CARE_PROVIDER_SITE_OTHER): Payer: PPO | Admitting: Ophthalmology

## 2020-04-05 DIAGNOSIS — H4302 Vitreous prolapse, left eye: Secondary | ICD-10-CM

## 2020-04-05 DIAGNOSIS — H35352 Cystoid macular degeneration, left eye: Secondary | ICD-10-CM | POA: Diagnosis not present

## 2020-04-05 DIAGNOSIS — T8522XA Displacement of intraocular lens, initial encounter: Secondary | ICD-10-CM

## 2020-04-05 MED ORDER — KETOROLAC TROMETHAMINE 0.5 % OP SOLN: 1.0000 [drp] | Freq: Two times a day (BID) | OPHTHALMIC | 0 refills | Status: DC

## 2020-04-05 NOTE — Assessment & Plan Note (Signed)
Resolved CME, on topical NSAID ketorolac 1 drop OS 4 times daily.  Because this developed in the postoperative course of care, I do recommend the patient use Acular 1 drop left eye twice daily to prevent recurrence

## 2020-04-05 NOTE — Progress Notes (Signed)
04/05/2020     CHIEF COMPLAINT Patient presents for Post-op Follow-up (5 WK POV OS//Pt sts "everything still seems pretty blurred with it" OS. Pt sts VA OS may be slightly improved since last visit. No ocular pain.)   HISTORY OF PRESENT ILLNESS: Albert Benjamin is a 70 y.o. male who presents to the clinic today for:   HPI    Post-op Follow-up    In left eye.  Vision is improved. Additional comments: 5 WK POV OS  Pt sts "everything still seems pretty blurred with it" OS. Pt sts VA OS may be slightly improved since last visit. No ocular pain.          Comments    Now nearly 3 months post vitrectomy, insertion PCIOL left eye with Yamani scleral tunnel technique, with IOL well centered in the past yet CME developed and found on examination February 2022.  Today's follow-up is to monitor CME and its resolution       Last edited by Edmon Crape, MD on 04/05/2020  9:19 AM. (History)      Referring physician: Crist Fat, MD [address] [place],  [place] [address]  HISTORICAL INFORMATION:   Selected notes from the MEDICAL RECORD NUMBER       CURRENT MEDICATIONS: Current Outpatient Medications (Ophthalmic Drugs)  Medication Sig  . ketorolac (ACULAR) 0.5 % ophthalmic solution Place 1 drop into the left eye 2 (two) times daily.   No current facility-administered medications for this visit. (Ophthalmic Drugs)   Current Outpatient Medications (Other)  Medication Sig  . cetirizine (ZYRTEC) 10 MG tablet Take 10 mg by mouth daily.  . citalopram (CELEXA) 40 MG tablet Take 40 mg by mouth daily.  Marland Kitchen lisinopril (ZESTRIL) 2.5 MG tablet Take 2.5 mg by mouth daily.  . montelukast (SINGULAIR) 10 MG tablet Take 10 mg by mouth at bedtime.  . pioglitazone (ACTOS) 15 MG tablet Take 15 mg by mouth daily.  . pravastatin (PRAVACHOL) 40 MG tablet Take 40 mg by mouth daily.   No current facility-administered medications for this visit. (Other)      REVIEW OF SYSTEMS:    ALLERGIES No  Known Allergies  PAST MEDICAL HISTORY Past Medical History:  Diagnosis Date  . Allergy   . Aphakia, left eye 02/03/2020   Secondary insertion of posterior chamber intraocular lens via Yamani scleral tunnel technique 02/02/2020 left eye  . Arthritis   . Cataract    bilkateral surgery 3-40 years ago  . Depression   . Diabetes mellitus   . Hypertension   . Obstructive sleep apnea on CPAP   . Sleep apnea    does not use CPAP  . Vitreous prolapse, left eye 01/11/2020  . Wears glasses   . Wears hearing aid    both ears   Past Surgical History:  Procedure Laterality Date  . ANTERIOR CERVICAL DECOMP/DISCECTOMY FUSION N/A 03/11/2012   Procedure: ANTERIOR CERVICAL DECOMPRESSION/DISCECTOMY FUSION 3 LEVELS;  Surgeon: Karn Cassis, MD;  Location: [facility];  Service: Neurosurgery;  Laterality: N/A;  Anterior cervical decompression/fusion Cervical four-five, Cervical five-six, Cervical six-seven  . COLONOSCOPY    . EYE SURGERY     catarct bil  . KNEE ARTHROSCOPY  2007   left, right 09/2011  . SHOULDER ARTHROSCOPY  2005,2010-   rightx2-dsc    FAMILY HISTORY Family History  Problem Relation Age of Onset  . Diabetes Mother   . Colon cancer Neg Hx   . Heart disease  Neg Hx   . Esophageal cancer Neg Hx   . Pancreatic cancer Neg Hx   . Stomach cancer Neg Hx   . Rectal cancer Neg Hx     SOCIAL HISTORY Social History   Tobacco Use  . Smoking status: Never Smoker  . Smokeless tobacco: Never Used  Vaping Use  . Vaping Use: Never used  Substance Use Topics  . Alcohol use: No  . Drug use: No         OPHTHALMIC EXAM: Base Eye Exam    Visual Acuity (ETDRS)      Right Left   Dist cc 20/20 -1 20/40   Dist ph cc  NI   Correction: Glasses       Tonometry (Tonopen, 8:20 AM)      Right Left   Pressure 19 17       Pupils      Pupils Dark Light Shape React APD   Right PERRL 4 3 Round Brisk None   Left PERRL 4 3 Round Brisk None       Visual Fields (Counting fingers)       Left Right    Full Full       Extraocular Movement      Right Left    Full Full       Neuro/Psych    Oriented x3: Yes   Mood/Affect: Normal       Dilation   Defer per GAR        Slit Lamp and Fundus Exam    External Exam      Right Left   External Normal Normal       Slit Lamp Exam      Right Left   Lids/Lashes Normal Normal   Conjunctiva/Sclera White and quiet Trace subconjunctival hemorrhage, no exposed haptics   Cornea Clear Clear, interrupted 10-0 nylon superiorly 3 and separate location superior temporal well-closed   Anterior Chamber Deep and quiet Deep and quiet, no pigment   Iris Round and reactive Round and reactive, no pi   Lens Centered posterior chamber intraocular lens, 3 piece Well centered Zeiss PCIOL, CT Lucia   Anterior Vitreous Normal Normal       Fundus Exam      Right Left   Posterior Vitreous  Clear, avitric   Disc  Normal   C/D Ratio  0.35   Macula  Cystoid macular edema   Vessels  Normal   Periphery  Normal,  attached peripherally.          IMAGING AND PROCEDURES  Imaging and Procedures for 04/05/20  OCT, Retina - OU - Both Eyes       Right Eye Quality was good. Scan locations included subfoveal. Central Foveal Thickness: 318. Progression has been stable. Findings include normal foveal contour.   Left Eye Quality was good. Scan locations included subfoveal. Central Foveal Thickness: 346. Progression has improved. Findings include normal foveal contour.   Notes CME OS massively improved over the last 1 month while taking topical NSAID 4 times daily.                ASSESSMENT/PLAN:  Cystoid macular edema of left eye Resolved CME, on topical NSAID ketorolac 1 drop OS 4 times daily.  Because this developed in the postoperative course of care, I do recommend the patient use Acular 1 drop left eye twice daily to prevent recurrence  Dislocated IOL (intraocular lens), posterior, left   Central IOL, status post  surgical repair  02/02/2020  Patient may follow-up at any time with Dr. Coralyn Pear for refraction  Vitreous prolapse, left eye Resolved post vitrectomy and insertion of posterior chamber lens via Yamani scleral tunnel      ICD-10-CM   1. Vitreous prolapse, left eye  H43.02 OCT, Retina - OU - Both Eyes  2. Cystoid macular edema of left eye  H35.352   3. Dislocated IOL (intraocular lens), posterior, left  T85.22XA     1.  Pseudophakic CME of the left eye on the basis of suspended new intraocular lens in the posterior chamber via Yamani scleral tunnel.  He has resolved on topical NSAID alone, using ketorolac 4 times daily.  2.  Because of the occurrence of CME happened, I will suggest patient remain on chronic ketorolac 1 drop left eye twice daily to prevent its recurrence.  3.  Patient may follow-up with Dr. Coralyn Pear at anytime for refraction and routine follow-up  Ophthalmic Meds Ordered this visit:  Meds ordered this encounter  Medications  . ketorolac (ACULAR) 0.5 % ophthalmic solution    Sig: Place 1 drop into the left eye 2 (two) times daily.    Dispense:  5 mL    Refill:  0       Return in about 3 months (around 07/06/2020) for dilate, OS, OCT.  There are no Patient Instructions on file for this visit.   Explained the diagnoses, plan, and follow up with the patient and they expressed understanding.  Patient expressed understanding of the importance of proper follow up care.   Alford Highland Tawn Fitzner M.D. Diseases & Surgery of the Retina and Vitreous [facility] 04/05/20     Abbreviations: M myopia (nearsighted); A astigmatism; H hyperopia (farsighted); P presbyopia; Mrx spectacle prescription;  CTL contact lenses; OD right eye; OS left eye; OU both eyes  XT exotropia; ET esotropia; PEK punctate epithelial keratitis; PEE punctate epithelial erosions; DES dry eye syndrome; MGD meibomian gland dysfunction; ATs artificial tears; PFAT's preservative free  artificial tears; NSC nuclear sclerotic cataract; PSC posterior subcapsular cataract; ERM epi-retinal membrane; PVD posterior vitreous detachment; RD retinal detachment; DM diabetes mellitus; DR diabetic retinopathy; NPDR non-proliferative diabetic retinopathy; PDR proliferative diabetic retinopathy; CSME clinically significant macular edema; DME diabetic macular edema; dbh dot blot hemorrhages; CWS cotton wool spot; POAG primary open angle glaucoma; C/D cup-to-disc ratio; HVF humphrey visual field; GVF goldmann visual field; OCT optical coherence tomography; IOP intraocular pressure; BRVO Branch retinal vein occlusion; CRVO central retinal vein occlusion; CRAO central retinal artery occlusion; BRAO branch retinal artery occlusion; RT retinal tear; SB scleral buckle; PPV pars plana vitrectomy; VH Vitreous hemorrhage; PRP panretinal laser photocoagulation; IVK intravitreal kenalog; VMT vitreomacular traction; MH Macular hole;  NVD neovascularization of the disc; NVE neovascularization elsewhere; AREDS age related eye disease study; ARMD age related macular degeneration; POAG primary open angle glaucoma; EBMD epithelial/anterior basement membrane dystrophy; ACIOL anterior chamber intraocular lens; IOL intraocular lens; PCIOL posterior chamber intraocular lens; Phaco/IOL phacoemulsification with intraocular lens placement; PRK photorefractive keratectomy; LASIK laser assisted in situ keratomileusis; HTN hypertension; DM diabetes mellitus; COPD chronic obstructive pulmonary disease

## 2020-04-05 NOTE — Assessment & Plan Note (Signed)
Resolved post vitrectomy and insertion of posterior chamber lens via Yamani scleral tunnel

## 2020-04-05 NOTE — Assessment & Plan Note (Addendum)
   Central IOL, status post surgical repair 02/02/2020  Patient may follow-up at any time with Dr. Coralyn Pear for refraction

## 2020-04-06 DIAGNOSIS — I10 Essential (primary) hypertension: Secondary | ICD-10-CM | POA: Diagnosis not present

## 2020-04-06 DIAGNOSIS — E7800 Pure hypercholesterolemia, unspecified: Secondary | ICD-10-CM | POA: Diagnosis not present

## 2020-04-06 DIAGNOSIS — E119 Type 2 diabetes mellitus without complications: Secondary | ICD-10-CM | POA: Diagnosis not present

## 2020-04-06 DIAGNOSIS — R3911 Hesitancy of micturition: Secondary | ICD-10-CM | POA: Diagnosis not present

## 2020-04-13 DIAGNOSIS — M5416 Radiculopathy, lumbar region: Secondary | ICD-10-CM | POA: Diagnosis not present

## 2020-04-14 ENCOUNTER — Other Ambulatory Visit: Payer: Self-pay | Admitting: Neurosurgery

## 2020-04-14 DIAGNOSIS — M5416 Radiculopathy, lumbar region: Secondary | ICD-10-CM

## 2020-04-17 DIAGNOSIS — H59032 Cystoid macular edema following cataract surgery, left eye: Secondary | ICD-10-CM | POA: Diagnosis not present

## 2020-04-25 ENCOUNTER — Ambulatory Visit
Admission: RE | Admit: 2020-04-25 | Discharge: 2020-04-25 | Disposition: A | Discharge status: Home / Self Care | Payer: PPO | Source: Ambulatory Visit | Attending: Neurosurgery | Admitting: Neurosurgery

## 2020-04-25 ENCOUNTER — Other Ambulatory Visit: Payer: Self-pay

## 2020-04-25 DIAGNOSIS — M5416 Radiculopathy, lumbar region: Secondary | ICD-10-CM

## 2020-04-25 DIAGNOSIS — M545 Low back pain, unspecified: Secondary | ICD-10-CM | POA: Diagnosis not present

## 2020-04-26 ENCOUNTER — Encounter (INDEPENDENT_AMBULATORY_CARE_PROVIDER_SITE_OTHER): Payer: PPO | Admitting: Ophthalmology

## 2020-05-03 DIAGNOSIS — M25572 Pain in left ankle and joints of left foot: Secondary | ICD-10-CM | POA: Diagnosis not present

## 2020-05-04 ENCOUNTER — Other Ambulatory Visit: Payer: PPO

## 2020-05-10 DIAGNOSIS — M5416 Radiculopathy, lumbar region: Secondary | ICD-10-CM | POA: Diagnosis not present

## 2020-05-10 DIAGNOSIS — E119 Type 2 diabetes mellitus without complications: Secondary | ICD-10-CM | POA: Diagnosis not present

## 2020-05-10 DIAGNOSIS — Z6835 Body mass index (BMI) 35.0-35.9, adult: Secondary | ICD-10-CM | POA: Diagnosis not present

## 2020-05-10 DIAGNOSIS — I10 Essential (primary) hypertension: Secondary | ICD-10-CM | POA: Diagnosis not present

## 2020-05-10 DIAGNOSIS — E785 Hyperlipidemia, unspecified: Secondary | ICD-10-CM | POA: Diagnosis not present

## 2020-05-22 DIAGNOSIS — R059 Cough, unspecified: Secondary | ICD-10-CM | POA: Diagnosis not present

## 2020-05-22 DIAGNOSIS — R918 Other nonspecific abnormal finding of lung field: Secondary | ICD-10-CM | POA: Diagnosis not present

## 2020-05-22 DIAGNOSIS — U071 COVID-19: Secondary | ICD-10-CM | POA: Diagnosis not present

## 2020-05-22 DIAGNOSIS — M47814 Spondylosis without myelopathy or radiculopathy, thoracic region: Secondary | ICD-10-CM | POA: Diagnosis not present

## 2020-05-31 DIAGNOSIS — M25572 Pain in left ankle and joints of left foot: Secondary | ICD-10-CM | POA: Diagnosis not present

## 2020-06-15 DIAGNOSIS — M48062 Spinal stenosis, lumbar region with neurogenic claudication: Secondary | ICD-10-CM | POA: Diagnosis not present

## 2020-06-15 DIAGNOSIS — Z6835 Body mass index (BMI) 35.0-35.9, adult: Secondary | ICD-10-CM | POA: Diagnosis not present

## 2020-06-15 DIAGNOSIS — I10 Essential (primary) hypertension: Secondary | ICD-10-CM | POA: Diagnosis not present

## 2020-06-16 ENCOUNTER — Other Ambulatory Visit: Payer: Self-pay | Admitting: Neurosurgery

## 2020-06-28 DIAGNOSIS — M25572 Pain in left ankle and joints of left foot: Secondary | ICD-10-CM | POA: Diagnosis not present

## 2020-07-06 ENCOUNTER — Encounter (INDEPENDENT_AMBULATORY_CARE_PROVIDER_SITE_OTHER): Payer: Self-pay | Admitting: Ophthalmology

## 2020-07-06 ENCOUNTER — Other Ambulatory Visit: Payer: Self-pay

## 2020-07-06 ENCOUNTER — Ambulatory Visit (INDEPENDENT_AMBULATORY_CARE_PROVIDER_SITE_OTHER): Payer: PPO | Admitting: Ophthalmology

## 2020-07-06 DIAGNOSIS — H35352 Cystoid macular degeneration, left eye: Secondary | ICD-10-CM

## 2020-07-06 MED ORDER — KETOROLAC TROMETHAMINE 0.5 % OP SOLN: 1.0000 [drp] | Freq: Four times a day (QID) | OPHTHALMIC | 0 refills | Status: DC

## 2020-07-06 NOTE — Assessment & Plan Note (Signed)
Now recurrent CME seen as compared to last visit April 05, 2020.  Patient largely off of NSAID.  Noncompliant with once daily.  CME was doing well at twice daily.  We will need to resume topical NSAID, ketorolac left eye 4 times daily for the next 6 weeks.  We may be able to taper this to 3 times daily or twice daily in the future but will likely need this long-term left eye

## 2020-07-06 NOTE — Progress Notes (Signed)
07/06/2020     CHIEF COMPLAINT Patient presents for Retina Follow Up (3 months fu OS and OCT/Pt states [facility] OU stable since last visit. Pt denies FOL, floaters, or ocular pain OU. /Pt reports that he is still using Ketorolac gtts "when I can remember them" once a day in OS/A1C:6.7/LBS:does not check/)   HISTORY OF PRESENT ILLNESS: Albert Benjamin is a 70 y.o. male who presents to the clinic today for:   HPI     Retina Follow Up           Diagnosis: Other   Laterality: left eye   Onset: 3 months ago   Severity: mild   Duration: 3 months   Course: stable   Comments: 3 months fu OS and OCT Pt states VA OU stable since last visit. Pt denies FOL, floaters, or ocular pain OU.  Pt reports that he is still using Ketorolac gtts "when I can remember them" once a day in OS A1C:6.7 GYI:RSWN not check          Comments   Not using every day, mostly once a day at as recommended to prevent recurrence of CME OS      Last edited by Edmon Crape, MD on 07/06/2020  8:47 AM.      Referring physician: Crist Fat, MD [address] [place],  [place] [address]  HISTORICAL INFORMATION:   Selected notes from the MEDICAL RECORD NUMBER       CURRENT MEDICATIONS: Current Outpatient Medications (Ophthalmic Drugs)  Medication Sig   ketorolac (ACULAR) 0.5 % ophthalmic solution Place 1 drop into the left eye 2 (two) times daily.   No current facility-administered medications for this visit. (Ophthalmic Drugs)   Current Outpatient Medications (Other)  Medication Sig   cetirizine (ZYRTEC) 10 MG tablet Take 10 mg by mouth daily.   citalopram (CELEXA) 40 MG tablet Take 40 mg by mouth daily.   lisinopril (ZESTRIL) 2.5 MG tablet Take 2.5 mg by mouth daily.   montelukast (SINGULAIR) 10 MG tablet Take 10 mg by mouth at bedtime.   pioglitazone (ACTOS) 15 MG tablet Take 15 mg by mouth daily.   pravastatin (PRAVACHOL) 40 MG tablet Take 40 mg by mouth daily.   No current facility-administered  medications for this visit. (Other)      REVIEW OF SYSTEMS:    ALLERGIES No Known Allergies  PAST MEDICAL HISTORY Past Medical History:  Diagnosis Date   Allergy    Aphakia, left eye 02/03/2020   Secondary insertion of posterior chamber intraocular lens via Yamani scleral tunnel technique 02/02/2020 left eye   Arthritis    Cataract    bilkateral surgery 3-40 years ago   Depression    Diabetes mellitus    Hypertension    Obstructive sleep apnea on CPAP    Sleep apnea    does not use CPAP   Vitreous prolapse, left eye 01/11/2020   Wears glasses    Wears hearing aid    both ears   Past Surgical History:  Procedure Laterality Date   ANTERIOR CERVICAL DECOMP/DISCECTOMY FUSION N/A 03/11/2012   Procedure: ANTERIOR CERVICAL DECOMPRESSION/DISCECTOMY FUSION 3 LEVELS;  Surgeon: Karn Cassis, MD;  Location: [facility];  Service: Neurosurgery;  Laterality: N/A;  Anterior cervical decompression/fusion Cervical four-five, Cervical five-six, Cervical six-seven   COLONOSCOPY     EYE SURGERY     catarct bil   KNEE ARTHROSCOPY  2007   left, right 09/2011   SHOULDER  ARTHROSCOPY  2005,2010-   rightx2-dsc    FAMILY HISTORY Family History  Problem Relation Age of Onset   Diabetes Mother    Colon cancer Neg Hx    Heart disease Neg Hx    Esophageal cancer Neg Hx    Pancreatic cancer Neg Hx    Stomach cancer Neg Hx    Rectal cancer Neg Hx     SOCIAL HISTORY Social History   Tobacco Use   Smoking status: Never   Smokeless tobacco: Never  Vaping Use   Vaping Use: Never used  Substance Use Topics   Alcohol use: No   Drug use: No         OPHTHALMIC EXAM:  Base Eye Exam     Visual Acuity (ETDRS)       Right Left   Dist cc 20/20 -1 20/100   Dist ph cc  NI         Tonometry (Tonopen, 8:19 AM)       Right Left   Pressure 14 13         Pupils       Pupils Dark Light Shape React APD   Right PERRL 4 3 Round Brisk None   Left PERRL 4 3 Round Brisk None          Visual Fields (Counting fingers)       Left Right    Full Full         Extraocular Movement       Right Left    Full Full         Neuro/Psych     Oriented x3: Yes   Mood/Affect: Normal         Dilation     Left eye: 1.0% Mydriacyl, 2.5% Phenylephrine @ 8:19 AM           Slit Lamp and Fundus Exam     External Exam       Right Left   External Normal Normal         Slit Lamp Exam       Right Left   Lids/Lashes Normal Normal   Conjunctiva/Sclera White and quiet Trace subconjunctival hemorrhage, no exposed haptics   Cornea Clear Clear, interrupted 10-0 nylon superiorly 3 and separate location superior temporal well-closed   Anterior Chamber Deep and quiet Deep and quiet, no pigment   Iris Round and reactive Round and reactive, no pi   Lens Centered posterior chamber intraocular lens, 3 piece Well centered Zeiss PCIOL, CT Lucia   Anterior Vitreous Normal Normal         Fundus Exam       Right Left   Posterior Vitreous  Clear, avitric   Disc  Normal   C/D Ratio  0.35   Macula  Cystoid macular edema   Vessels  Normal   Periphery  Normal,  attached peripherally.            IMAGING AND PROCEDURES  Imaging and Procedures for 07/06/20  OCT, Retina - OU - Both Eyes       Right Eye Quality was good. Scan locations included subfoveal. Central Foveal Thickness: 316. Progression has been stable. Findings include normal foveal contour.   Left Eye Quality was good. Scan locations included subfoveal. Central Foveal Thickness: 506. Progression has improved. Findings include cystoid macular edema.   Notes CME OS current now off of NSAIDs topically twice daily and now only on once daily and sometimes not using.  ASSESSMENT/PLAN:  Cystoid macular edema of left eye Now recurrent CME seen as compared to last visit April 05, 2020.  Patient largely off of NSAID.  Noncompliant with once daily.  CME was doing well at twice  daily.  We will need to resume topical NSAID, ketorolac left eye 4 times daily for the next 6 weeks.  We may be able to taper this to 3 times daily or twice daily in the future but will likely need this long-term left eye     ICD-10-CM   1. Cystoid macular edema of left eye  H35.352 OCT, Retina - OU - Both Eyes      1.  OS with history of dislocated lens, no open capsule support, insertion via Yamane scleral tunnel, Zeiss IOL.  Excellent centration and excellent visual acuity without CME.  Nonetheless CME has recurred off of topical NSAIDs.  We will need to resume topical NSAIDs OS  2.  Ketorolac OS 4 times topically daily and return visit in 6 weeks  3.  Ophthalmic Meds Ordered this visit:  No orders of the defined types were placed in this encounter.      Return in about 6 weeks (around 08/17/2020) for dilate, OS, OCT.  There are no Patient Instructions on file for this visit.   Explained the diagnoses, plan, and follow up with the patient and they expressed understanding.  Patient expressed understanding of the importance of proper follow up care.   Alford Highland Francessca Friis M.D. Diseases & Surgery of the Retina and Vitreous [facility] 07/06/20     Abbreviations: M myopia (nearsighted); A astigmatism; H hyperopia (farsighted); P presbyopia; Mrx spectacle prescription;  CTL contact lenses; OD right eye; OS left eye; OU both eyes  XT exotropia; ET esotropia; PEK punctate epithelial keratitis; PEE punctate epithelial erosions; DES dry eye syndrome; MGD meibomian gland dysfunction; ATs artificial tears; PFAT's preservative free artificial tears; NSC nuclear sclerotic cataract; PSC posterior subcapsular cataract; ERM epi-retinal membrane; PVD posterior vitreous detachment; RD retinal detachment; DM diabetes mellitus; DR diabetic retinopathy; NPDR non-proliferative diabetic retinopathy; PDR proliferative diabetic retinopathy; CSME clinically significant macular edema; DME  diabetic macular edema; dbh dot blot hemorrhages; CWS cotton wool spot; POAG primary open angle glaucoma; C/D cup-to-disc ratio; HVF humphrey visual field; GVF goldmann visual field; OCT optical coherence tomography; IOP intraocular pressure; BRVO Branch retinal vein occlusion; CRVO central retinal vein occlusion; CRAO central retinal artery occlusion; BRAO branch retinal artery occlusion; RT retinal tear; SB scleral buckle; PPV pars plana vitrectomy; VH Vitreous hemorrhage; PRP panretinal laser photocoagulation; IVK intravitreal kenalog; VMT vitreomacular traction; MH Macular hole;  NVD neovascularization of the disc; NVE neovascularization elsewhere; AREDS age related eye disease study; ARMD age related macular degeneration; POAG primary open angle glaucoma; EBMD epithelial/anterior basement membrane dystrophy; ACIOL anterior chamber intraocular lens; IOL intraocular lens; PCIOL posterior chamber intraocular lens; Phaco/IOL phacoemulsification with intraocular lens placement; PRK photorefractive keratectomy; LASIK laser assisted in situ keratomileusis; HTN hypertension; DM diabetes mellitus; COPD chronic obstructive pulmonary disease

## 2020-07-07 DIAGNOSIS — E1165 Type 2 diabetes mellitus with hyperglycemia: Secondary | ICD-10-CM | POA: Diagnosis not present

## 2020-08-17 ENCOUNTER — Ambulatory Visit (INDEPENDENT_AMBULATORY_CARE_PROVIDER_SITE_OTHER): Payer: PPO | Admitting: Ophthalmology

## 2020-08-17 ENCOUNTER — Other Ambulatory Visit: Payer: Self-pay

## 2020-08-17 ENCOUNTER — Encounter (INDEPENDENT_AMBULATORY_CARE_PROVIDER_SITE_OTHER): Payer: Self-pay | Admitting: Ophthalmology

## 2020-08-17 DIAGNOSIS — H35352 Cystoid macular degeneration, left eye: Secondary | ICD-10-CM | POA: Diagnosis not present

## 2020-08-17 DIAGNOSIS — H402222 Chronic angle-closure glaucoma, left eye, moderate stage: Secondary | ICD-10-CM

## 2020-08-17 DIAGNOSIS — T8522XA Displacement of intraocular lens, initial encounter: Secondary | ICD-10-CM

## 2020-08-17 DIAGNOSIS — H4052X2 Glaucoma secondary to other eye disorders, left eye, moderate stage: Secondary | ICD-10-CM

## 2020-08-17 NOTE — Assessment & Plan Note (Signed)
Technically this is not narrow-angle much as potential narrow angle from pupillary block of fluctuating iris lens position to the sterile surface of the iris.  360 degrees of illumination defects of the iris suggest that there is iris lens touch ongoing likely a ball-valve type activity which will should be equilibrated with YAG laser PI delivered OS today

## 2020-08-17 NOTE — Progress Notes (Signed)
08/17/2020     CHIEF COMPLAINT Patient presents for Retina Follow Up (3 months fu OS and OCT/Pt states VA OU stable since last visit. Pt denies FOL, floaters, or ocular pain OU. /Pt reports that he is still using Ketorolac gtts "when I can remember them" once a day in OS/A1C:6.7/LBS:does not check/)   HISTORY OF PRESENT ILLNESS: Albert Benjamin is a 70 y.o. male who presents to the clinic today for:   HPI     Retina Follow Up           Diagnosis: Other   Laterality: left eye   Onset: 6 months ago   Severity: mild   Duration: 6 months   Course: rapidly improving   Comments: 3 months fu OS and OCT Pt states VA OU stable since last visit. Pt denies FOL, floaters, or ocular pain OU.  Pt reports that he is still using Ketorolac gtts "when I can remember them" once a day in OS A1C:6.7 ZOX:WRUELBS:does not check          Comments   6 week fu os oct Patient states his vision is better and has cleared up. Patient states he is using ketorolac OS qid.       Last edited by Nelva NayKronstein, Anna N, COA on 08/17/2020  8:57 AM.      Referring physician: Crist FatVan Benjamin, Jason, MD [address] [place] [id number]  HISTORICAL INFORMATION:   Selected notes from the MEDICAL RECORD NUMBER       CURRENT MEDICATIONS: Current Outpatient Medications (Ophthalmic Drugs)  Medication Sig   ketorolac (ACULAR) 0.5 % ophthalmic solution Place 1 drop into the left eye 4 (four) times daily.   No current facility-administered medications for this visit. (Ophthalmic Drugs)   Current Outpatient Medications (Other)  Medication Sig   cetirizine (ZYRTEC) 10 MG tablet Take 10 mg by mouth daily.   citalopram (CELEXA) 40 MG tablet Take 40 mg by mouth every evening.   lisinopril (ZESTRIL) 2.5 MG tablet Take 2.5 mg by mouth daily.   montelukast (SINGULAIR) 10 MG tablet Take 10 mg by mouth at bedtime.   pioglitazone (ACTOS) 15 MG tablet Take 15 mg by mouth daily.   pravastatin (PRAVACHOL) 40 MG tablet Take 40  mg by mouth every evening.   No current facility-administered medications for this visit. (Other)      REVIEW OF SYSTEMS:    ALLERGIES No Known Allergies  PAST MEDICAL HISTORY Past Medical History:  Diagnosis Date   Allergy    Aphakia, left eye 02/03/2020   Secondary insertion of posterior chamber intraocular lens via Yamani scleral tunnel technique 02/02/2020 left eye   Arthritis    Cataract    bilkateral surgery 3-40 years ago   Depression    Diabetes mellitus    Hypertension    Obstructive sleep apnea on CPAP    Sleep apnea    does not use CPAP   Vitreous prolapse, left eye 01/11/2020   Wears glasses    Wears hearing aid    both ears   Past Surgical History:  Procedure Laterality Date   ANTERIOR CERVICAL DECOMP/DISCECTOMY FUSION N/A 03/11/2012   Procedure: ANTERIOR CERVICAL DECOMPRESSION/DISCECTOMY FUSION 3 LEVELS;  Surgeon: Karn CassisErnesto M Botero, MD;  Location: [facility];  Service: Neurosurgery;  Laterality: N/A;  Anterior cervical decompression/fusion Cervical four-five, Cervical five-six, Cervical six-seven   COLONOSCOPY     EYE SURGERY     catarct bil   KNEE ARTHROSCOPY  2007   left, right 09/2011   SHOULDER ARTHROSCOPY  2005,2010-   rightx2-dsc    FAMILY HISTORY Family History  Problem Relation Age of Onset   Diabetes Mother    Colon cancer Neg Hx    Heart disease Neg Hx    Esophageal cancer Neg Hx    Pancreatic cancer Neg Hx    Stomach cancer Neg Hx    Rectal cancer Neg Hx     SOCIAL HISTORY Social History   Tobacco Use   Smoking status: Never   Smokeless tobacco: Never  Vaping Use   Vaping Use: Never used  Substance Use Topics   Alcohol use: No   Drug use: No         OPHTHALMIC EXAM:  Base Eye Exam     Visual Acuity (ETDRS)       Right Left   Dist cc 20/20 20/20 -1         Tonometry (Tonopen, 8:54 AM)       Right Left   Pressure 15 15         Pupils       Pupils Dark Light Shape React APD   Right PERRL 5 4 Round Brisk  None   Left PERRL 5 4 Round Brisk None         Visual Fields (Counting fingers)       Left Right    Full Full         Extraocular Movement       Right Left    Full Full         Neuro/Psych     Oriented x3: Yes   Mood/Affect: Normal         Dilation     Left eye: 1.0% Mydriacyl, 2.5% Phenylephrine @ 8:54 AM           Slit Lamp and Fundus Exam     External Exam       Right Left   External Normal Normal         Slit Lamp Exam       Right Left   Lids/Lashes Normal Normal   Conjunctiva/Sclera White and quiet Trace subconjunctival hemorrhage, no exposed haptics   Cornea Clear Clear, interrupted 10-0 nylon superiorly 3 and separate location superior temporal well-closed   Anterior Chamber Deep and quiet Deep and quiet, no pigment   Iris Round and reactive Round and reactive, no pi, with iris transillumination defects nearly 360   Lens Centered posterior chamber intraocular lens, 3 piece Well centered Zeiss PCIOL, CT Lucia   Anterior Vitreous Normal Normal         Fundus Exam       Right Left   Posterior Vitreous  Clear, avitric   Disc  Normal   C/D Ratio  0.35   Macula  Normal   Vessels  Normal   Periphery  Normal,  attached peripherally.            IMAGING AND PROCEDURES  Imaging and Procedures for 08/17/20  OCT, Retina - OU - Both Eyes       Right Eye Quality was good. Scan locations included subfoveal. Central Foveal Thickness: 316. Progression has been stable. Findings include normal foveal contour.   Left Eye Quality was good. Scan locations included subfoveal. Central Foveal Thickness: 330. Progression has improved. Findings include cystoid macular edema.   Notes OS, vastly improved CME on topical NSAIDs.  Patient may have IOL iris chafe from the [facility]  scleral tunnel fixated IOL.  We will thus use YAG laser peripheral lobectomy inferiorly in the left eye.     Yag Iridectomy - OS - Left Eye       Time Out Confirmed  correct patient, procedure, site, and patient consented.   Laser Information The type of laser was yag. Total spots was 6. The energy was 2.3 mj.   Post-op The patient tolerated the procedure well. There were no complications. The patient received written and verbal post procedure care education.   Notes YAG laser iridotomy inferiorly iridectomy inferiorly 6:00 meridian             ASSESSMENT/PLAN:  Dislocated IOL (intraocular lens), posterior, left Patient may have IOL iris chafe from the [facility] scleral tunnel fixated IOL.  We will thus use YAG laser peripheral iridectomy inferiorly in the left eye.  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  Chronic narrow angle glaucoma, left, moderate stage Technically this is not narrow-angle much as potential narrow angle from pupillary block of fluctuating iris lens position to the sterile surface of the iris.  360 degrees of illumination defects of the iris suggest that there is iris lens touch ongoing likely a ball-valve type activity which will should be equilibrated with YAG laser PI delivered OS today     ICD-10-CM   1. Cystoid macular edema of left eye  H35.352 OCT, Retina - OU - Both Eyes    2. Dislocated IOL (intraocular lens), posterior, left  T85.22XA     3. 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 Chronic narrow angle glaucoma, left, moderate stage  H40.2222 Yag Iridectomy - OS - Left Eye      1.  Pseudophakic CME OS vastly improved today on topical NSAIDs.  2.  Iris  transillumination defects nearly 360 OS do suggest that there is ball-valve type iris IOL chafe and or possible secondary pupil occlusion.  Thus will need YAG laser peripheral iridectomy performed inferiorly OS today  3.  YAG laser iridectomy completed OS today without complication.  Ophthalmic Meds Ordered this visit:  No orders of the defined types were placed in this encounter.      Return in about 5 weeks (around 09/21/2020) for OCT, NO DILATE.  Patient Instructions  Patient instructed to continue on topical NSAIDs ketorolac left eye 1 drop 4 times daily for the next 3 weeks and then tapered to 3 times daily for 2 weeks prior to his next appointment in 5 weeks   Explained the diagnoses, plan, and follow up with the patient and they expressed understanding.  Patient expressed understanding of the importance of proper follow up care.   Alford Highland Janesa Dockery M.D. Diseases & Surgery of the Retina and Vitreous [facility] 08/17/20     Abbreviations: M myopia (nearsighted); A astigmatism; H hyperopia (farsighted); P presbyopia; Mrx spectacle prescription;  CTL contact lenses; OD right eye; OS left eye; OU both eyes  XT exotropia; ET esotropia; PEK punctate epithelial keratitis; PEE punctate epithelial erosions; DES dry eye syndrome; MGD meibomian gland dysfunction; ATs artificial tears;  PFAT's preservative free artificial tears; NSC nuclear sclerotic cataract; PSC posterior subcapsular cataract; ERM epi-retinal membrane; PVD posterior vitreous detachment; RD retinal detachment; DM diabetes mellitus; DR diabetic retinopathy; NPDR non-proliferative diabetic retinopathy; PDR proliferative diabetic retinopathy; CSME clinically significant macular edema; DME diabetic macular edema; dbh dot blot hemorrhages; CWS cotton wool spot; POAG primary open angle glaucoma; C/D cup-to-disc ratio; HVF humphrey visual field; GVF goldmann visual field; OCT optical coherence tomography; IOP intraocular  pressure; BRVO Branch retinal vein occlusion; CRVO central retinal vein occlusion; CRAO central retinal artery occlusion; BRAO branch retinal artery occlusion; RT retinal tear; SB scleral buckle; PPV pars plana vitrectomy; VH Vitreous hemorrhage; PRP panretinal laser photocoagulation; IVK intravitreal kenalog; VMT vitreomacular traction; MH Macular hole;  NVD neovascularization of the disc; NVE neovascularization elsewhere; AREDS age related eye disease study; ARMD age related macular degeneration; POAG primary open angle glaucoma; EBMD epithelial/anterior basement membrane dystrophy; ACIOL anterior chamber intraocular lens; IOL intraocular lens; PCIOL posterior chamber intraocular lens; Phaco/IOL phacoemulsification with intraocular lens placement; PRK photorefractive keratectomy; LASIK laser assisted in situ keratomileusis; HTN hypertension; DM diabetes mellitus; COPD chronic obstructive pulmonary disease

## 2020-08-17 NOTE — Patient Instructions (Signed)
Patient instructed to continue on topical NSAIDs ketorolac left eye 1 drop 4 times daily for the next 3 weeks and then tapered to 3 times daily for 2 weeks prior to his next appointment in 5 weeks

## 2020-08-17 NOTE — Assessment & Plan Note (Signed)
Patient may have IOL iris chafe from the [facility] scleral tunnel fixated IOL.  We will thus use YAG laser peripheral iridectomy inferiorly in the left eye.  YAG laser peripheral iridectomy inferiorly will equilibrate the pressure from the anterior and posterior chambers

## 2020-08-17 NOTE — Assessment & Plan Note (Signed)
YAG laser iridectomy inferiorly at 6:00 meridian to equilibrate the pressure from the anterior and posterior chambers and to prevent flux of secondary pupillary block of the IOL to the iris interface from suspended IOL.

## 2020-08-18 NOTE — Progress Notes (Signed)
Surgical Instructions   Your procedure is scheduled on Monday 08/28/2020.  Report to [facility] Main Entrance "A" at 06:00 A.M., then check in with the Admitting office.  Call [phone] if you have problems or questions between now and the morning of surgery:   Remember: Do not eat or drink after midnight the night before your surgery    Take these medicines the morning of surgery with A SIP OF WATER  Cetirizine (Zyrtec) Ketorolac (Acular) eye drop  As of today, STOP taking any Aspirin (unless otherwise instructed by your surgeon) or Aspirin-containing products; NSAIDS - Aleve, Naproxen, Ibuprofen, Motrin, Advil, Goody's, BC's, all herbal medications, fish oil, and all vitamins.  WHAT DO I DO ABOUT MY DIABETES MEDICATION?  Do not take oral diabetes medicines (pills) the morning of surgery. THE MORNING OF SURGERY, DO NOT take Pioglitazone (Actos)   HOW TO MANAGE YOUR DIABETES BEFORE AND AFTER SURGERY  Why is it important to control my blood sugar before and after surgery? Improving blood sugar levels before and after surgery helps healing and can limit problems. A way of improving blood sugar control is eating a healthy diet by:  Eating less sugar and carbohydrates  Increasing activity/exercise  Talking with your doctor about reaching your blood sugar goals High blood sugars (greater than 180 mg/dL) can raise your risk of infections and slow your recovery, so you will need to focus on controlling your diabetes during the weeks before surgery. Make sure that the doctor who takes care of your diabetes knows about your planned surgery including the date and location.  How do I manage my blood sugar before surgery? Check your blood sugar at least 4 times a day, starting 2 days before surgery, to make sure that the level is not too high or low.  Check your blood sugar the morning of your surgery when you wake up and every 2 hours until you get to the Short Stay unit.  If your  blood sugar is less than 70 mg/dL, you will need to treat for low blood sugar: Do not take insulin. Do not eat. Treat a low blood sugar (less than 70 mg/dL) with  cup of clear juice (cranberry or apple), 4 glucose tablets, OR glucose gel. Recheck blood sugar in 15 minutes after treatment (to make sure it is greater than 70 mg/dL). If your blood sugar is not greater than 70 mg/dL on recheck, call [phone] for further instructions. Report your blood sugar to the short stay nurse when you get to Short Stay.  If you are admitted to the hospital after surgery: Your blood sugar will be checked by the staff and you will probably be given insulin after surgery (instead of oral diabetes medicines) to make sure you have good blood sugar levels. The goal for blood sugar control after surgery is 80-180 mg/dL.           Do not wear jewelry  Do not wear lotions, powders, colognes, or deodorant. Do not shave 48 hours prior to surgery.  Men may shave face and neck. Do not bring valuables to the hospital - [facility] is not responsible for any belongings or valuables.              Do NOT Smoke (Tobacco/Vaping) or drink Alcohol 24 hours prior to your procedure  If you use a CPAP at night, you may bring all equipment for your overnight stay.   Contacts, glasses, hearing aids, dentures or partials may not be worn into  surgery, please bring cases for these belongings   For patients admitted to the hospital, discharge time will be determined by your treatment team.   Patients discharged the day of surgery will not be allowed to drive home, and someone needs to stay with them for 24 hours.  ONLY ONE (1) SUPPORT PERSON MAY WAIT IN THE WAITING AREA WHILE YOU ARE IN SURGERY. NO VISITORS WILL BE ALLOWED IN PRE-OP WHERE PATIENTS GET READY FOR SURGERY.  TWO (2) VISITORS WILL BE ALLOWED IN YOUR ROOM IF YOU ARE ADMITTED AFTER SURGERY.  Minor children may have two parents present. Special consideration for safety  and communication needs will be reviewed on a case by case basis.  Special instructions:    Oral Hygiene is also important to reduce your risk of infection.  Remember - BRUSH YOUR TEETH THE MORNING OF SURGERY WITH YOUR REGULAR TOOTHPASTE   Center- Preparing For Surgery  Before surgery, you can play an important role. Because skin is not sterile, your skin needs to be as free of germs as possible. You can reduce the number of germs on your skin by washing with CHG (chlorahexidine gluconate) Soap before surgery.  CHG is an antiseptic cleaner which kills germs and bonds with the skin to continue killing germs even after washing.     Please do not use if you have an allergy to CHG or antibacterial soaps. If your skin becomes reddened/irritated stop using the CHG.  Do not shave (including legs and underarms) for at least 48 hours prior to first CHG shower. It is OK to shave your face.  Please follow these instructions carefully.     Shower the NIGHT BEFORE SURGERY and the MORNING OF SURGERY with CHG Soap.   If you chose to wash your hair, wash your hair first as usual with your normal shampoo. After you shampoo, rinse your hair and body thoroughly to remove the shampoo.    Then [employer] and genitals (private parts) with your normal soap and rinse thoroughly to remove soap.  Next use the CHG Soap as you would any other liquid soap. You can apply CHG directly to the skin and wash gently with a clean washcloth.   Apply the CHG Soap to your body ONLY FROM THE NECK DOWN.  Do not use on open wounds or open sores. Avoid contact with your eyes, ears, mouth and genitals (private parts). Wash Face and genitals (private parts)  with your normal soap.   Wash thoroughly, paying special attention to the area where your surgery will be performed.  Thoroughly rinse your body with warm water from the neck down.  DO NOT shower/wash with your normal soap after using and rinsing off the CHG Soap.  Pat  yourself dry with a CLEAN TOWEL.  Wear CLEAN PAJAMAS to bed the night before surgery  Place CLEAN SHEETS on your bed the night before your surgery  DO NOT SLEEP WITH PETS.   Day of Surgery:  Take a shower with CHG soap. Wear Clean/Comfortable clothing the morning of surgery Do not apply any deodorants/lotions.   Remember to brush your teeth WITH YOUR REGULAR TOOTHPASTE.   Please read over the following fact sheets that you were given.

## 2020-08-21 ENCOUNTER — Other Ambulatory Visit: Payer: Self-pay

## 2020-08-21 ENCOUNTER — Encounter (HOSPITAL_COMMUNITY): Payer: Self-pay

## 2020-08-21 ENCOUNTER — Encounter (HOSPITAL_COMMUNITY)
Admission: RE | Admit: 2020-08-21 | Discharge: 2020-08-21 | Disposition: A | Discharge status: Home / Self Care | Payer: PPO | Source: Ambulatory Visit | Attending: Neurosurgery | Admitting: Neurosurgery

## 2020-08-21 DIAGNOSIS — Z01812 Encounter for preprocedural laboratory examination: Secondary | ICD-10-CM | POA: Insufficient documentation

## 2020-08-21 HISTORY — DX: Pneumonia, unspecified organism: J18.9

## 2020-08-21 LAB — CBC WITH DIFFERENTIAL/PLATELET
Abs Immature Granulocytes: 0.02 10*3/uL (ref 0.00–0.07)
Basophils Absolute: 0 10*3/uL (ref 0.0–0.1)
Basophils Relative: 1 %
Eosinophils Absolute: 0.2 10*3/uL (ref 0.0–0.5)
Eosinophils Relative: 3 %
HCT: 45.3 % (ref 39.0–52.0)
Hemoglobin: 14.6 g/dL (ref 13.0–17.0)
Immature Granulocytes: 0 %
Lymphocytes Relative: 25 %
Lymphs Abs: 1.5 10*3/uL (ref 0.7–4.0)
MCH: 30.7 pg (ref 26.0–34.0)
MCHC: 32.2 g/dL (ref 30.0–36.0)
MCV: 95.4 fL (ref 80.0–100.0)
Monocytes Absolute: 0.5 10*3/uL (ref 0.1–1.0)
Monocytes Relative: 9 %
Neutro Abs: 3.9 10*3/uL (ref 1.7–7.7)
Neutrophils Relative %: 62 %
Platelets: 171 10*3/uL (ref 150–400)
RBC: 4.75 MIL/uL (ref 4.22–5.81)
RDW: 13.2 % (ref 11.5–15.5)
WBC: 6.1 10*3/uL (ref 4.0–10.5)
nRBC: 0 % (ref 0.0–0.2)

## 2020-08-21 LAB — BASIC METABOLIC PANEL
Anion gap: 5 (ref 5–15)
BUN: 30 mg/dL — ABNORMAL HIGH (ref 8–23)
CO2: 25 mmol/L (ref 22–32)
Calcium: 9 mg/dL (ref 8.9–10.3)
Chloride: 108 mmol/L (ref 98–111)
Creatinine, Ser: 1.17 mg/dL (ref 0.61–1.24)
GFR, Estimated: >60 mL/min (ref >60)
Glucose, Bld: 118 mg/dL — ABNORMAL HIGH (ref 70–99)
Potassium: 5.4 mmol/L — ABNORMAL HIGH (ref 3.5–5.1)
Sodium: 138 mmol/L (ref 135–145)

## 2020-08-21 LAB — SURGICAL PCR SCREEN
MRSA, PCR: NEGATIVE
Staphylococcus aureus: NEGATIVE

## 2020-08-21 LAB — GLUCOSE, CAPILLARY: Glucose-Capillary: 107 mg/dL — ABNORMAL HIGH (ref 70–99)

## 2020-08-21 NOTE — Progress Notes (Addendum)
PCP - Crist Fat, MD [facility]) Cardiologist - denies  PPM/ICD - denies Device Orders - N/A Rep Notified - N/A  Chest x-ray - N/A EKG - 08/21/2020 Stress Test - denies ECHO - denies Cardiac Cath - denies  Sleep Study - yes, positive CPAP - patient is not wearing  Fasting Blood Sugar - patient is not checking CBG at home CBG today - 107 A1C - result requested (per patient, A1C was done in July 2022)  Blood Thinner Instructions: N/A Aspirin Instructions: Patient was instructed: As of today, STOP taking any Aspirin (unless otherwise instructed by your surgeon) or Aspirin-containing products; NSAIDS - Aleve, Naproxen, Ibuprofen, Motrin, Advil, Goody's, BC's, all herbal medications, fish oil, and all vitamins.  ERAS Protcol - No   COVID TEST- patient was instructed to go to the COVID testing site on 08/24/2020   Anesthesia review: yes; A1C result requested; abnormal EKG  Patient denies shortness of breath, fever, cough and chest pain at PAT appointment   All instructions explained to the patient, with a verbal understanding of the material. Patient agrees to go over the instructions while at home for a better understanding. Patient also instructed to self quarantine after being tested for COVID-19. The opportunity to ask questions was provided.

## 2020-08-24 ENCOUNTER — Encounter (INDEPENDENT_AMBULATORY_CARE_PROVIDER_SITE_OTHER): Payer: PPO | Admitting: Ophthalmology

## 2020-08-24 ENCOUNTER — Other Ambulatory Visit: Payer: Self-pay | Admitting: Neurosurgery

## 2020-08-24 DIAGNOSIS — Z961 Presence of intraocular lens: Secondary | ICD-10-CM | POA: Diagnosis not present

## 2020-08-24 DIAGNOSIS — E119 Type 2 diabetes mellitus without complications: Secondary | ICD-10-CM | POA: Diagnosis not present

## 2020-08-24 DIAGNOSIS — H59032 Cystoid macular edema following cataract surgery, left eye: Secondary | ICD-10-CM | POA: Diagnosis not present

## 2020-08-24 DIAGNOSIS — H524 Presbyopia: Secondary | ICD-10-CM | POA: Diagnosis not present

## 2020-08-24 DIAGNOSIS — Z7984 Long term (current) use of oral hypoglycemic drugs: Secondary | ICD-10-CM | POA: Diagnosis not present

## 2020-08-24 DIAGNOSIS — T8522XA Displacement of intraocular lens, initial encounter: Secondary | ICD-10-CM | POA: Diagnosis not present

## 2020-08-24 DIAGNOSIS — H1131 Conjunctival hemorrhage, right eye: Secondary | ICD-10-CM | POA: Diagnosis not present

## 2020-08-24 DIAGNOSIS — H43393 Other vitreous opacities, bilateral: Secondary | ICD-10-CM | POA: Diagnosis not present

## 2020-08-24 LAB — SARS CORONAVIRUS 2 (TAT 6-24 HRS): SARS Coronavirus 2: NEGATIVE

## 2020-08-27 ENCOUNTER — Encounter (HOSPITAL_COMMUNITY): Payer: Self-pay | Admitting: Neurosurgery

## 2020-08-28 ENCOUNTER — Inpatient Hospital Stay (HOSPITAL_COMMUNITY): Payer: PPO | Admitting: Certified Registered"

## 2020-08-28 ENCOUNTER — Inpatient Hospital Stay (HOSPITAL_COMMUNITY)
Admission: RE | Admit: 2020-08-28 | Discharge: 2020-08-29 | DRG: 517 | Disposition: A | Discharge status: Home / Self Care | Payer: PPO | Attending: Neurosurgery | Admitting: Neurosurgery

## 2020-08-28 ENCOUNTER — Inpatient Hospital Stay (HOSPITAL_COMMUNITY)
Admission: RE | Disposition: A | Discharge status: Home / Self Care | Payer: Self-pay | Source: Home / Self Care | Attending: Neurosurgery

## 2020-08-28 ENCOUNTER — Inpatient Hospital Stay (HOSPITAL_COMMUNITY): Payer: PPO | Admitting: Physician Assistant

## 2020-08-28 ENCOUNTER — Inpatient Hospital Stay (HOSPITAL_COMMUNITY): Payer: PPO

## 2020-08-28 ENCOUNTER — Other Ambulatory Visit: Payer: Self-pay

## 2020-08-28 ENCOUNTER — Encounter (HOSPITAL_COMMUNITY): Payer: Self-pay | Admitting: Neurosurgery

## 2020-08-28 DIAGNOSIS — G4733 Obstructive sleep apnea (adult) (pediatric): Secondary | ICD-10-CM | POA: Diagnosis present

## 2020-08-28 DIAGNOSIS — M1990 Unspecified osteoarthritis, unspecified site: Secondary | ICD-10-CM | POA: Diagnosis not present

## 2020-08-28 DIAGNOSIS — E119 Type 2 diabetes mellitus without complications: Secondary | ICD-10-CM | POA: Diagnosis present

## 2020-08-28 DIAGNOSIS — M48062 Spinal stenosis, lumbar region with neurogenic claudication: Secondary | ICD-10-CM | POA: Diagnosis not present

## 2020-08-28 DIAGNOSIS — Z974 Presence of external hearing-aid: Secondary | ICD-10-CM

## 2020-08-28 DIAGNOSIS — Z981 Arthrodesis status: Secondary | ICD-10-CM | POA: Diagnosis not present

## 2020-08-28 DIAGNOSIS — M5116 Intervertebral disc disorders with radiculopathy, lumbar region: Secondary | ICD-10-CM | POA: Diagnosis not present

## 2020-08-28 DIAGNOSIS — I10 Essential (primary) hypertension: Secondary | ICD-10-CM | POA: Diagnosis not present

## 2020-08-28 DIAGNOSIS — Z419 Encounter for procedure for purposes other than remedying health state, unspecified: Secondary | ICD-10-CM

## 2020-08-28 DIAGNOSIS — Z79899 Other long term (current) drug therapy: Secondary | ICD-10-CM | POA: Diagnosis not present

## 2020-08-28 DIAGNOSIS — Z833 Family history of diabetes mellitus: Secondary | ICD-10-CM

## 2020-08-28 DIAGNOSIS — F32A Depression, unspecified: Secondary | ICD-10-CM | POA: Diagnosis not present

## 2020-08-28 HISTORY — PX: LUMBAR LAMINECTOMY/DECOMPRESSION MICRODISCECTOMY: SHX5026

## 2020-08-28 LAB — BASIC METABOLIC PANEL
Anion gap: 7 (ref 5–15)
BUN: 33 mg/dL — ABNORMAL HIGH (ref 8–23)
CO2: 24 mmol/L (ref 22–32)
Calcium: 9.2 mg/dL (ref 8.9–10.3)
Chloride: 107 mmol/L (ref 98–111)
Creatinine, Ser: 1.23 mg/dL (ref 0.61–1.24)
GFR, Estimated: >60 mL/min (ref >60)
Glucose, Bld: 139 mg/dL — ABNORMAL HIGH (ref 70–99)
Potassium: 4.8 mmol/L (ref 3.5–5.1)
Sodium: 138 mmol/L (ref 135–145)

## 2020-08-28 LAB — GLUCOSE, CAPILLARY
Glucose-Capillary: 139 mg/dL — ABNORMAL HIGH (ref 70–99)
Glucose-Capillary: 150 mg/dL — ABNORMAL HIGH (ref 70–99)
Glucose-Capillary: 255 mg/dL — ABNORMAL HIGH (ref 70–99)
Glucose-Capillary: 323 mg/dL — ABNORMAL HIGH (ref 70–99)

## 2020-08-28 SURGERY — LUMBAR LAMINECTOMY/DECOMPRESSION MICRODISCECTOMY 4 LEVEL
Anesthesia: General | Site: Back

## 2020-08-28 MED ORDER — SODIUM CHLORIDE 0.9% FLUSH
3.0000 mL | Freq: Two times a day (BID) | INTRAVENOUS | Status: DC
  Administered 2020-08-28: 3 mL via INTRAVENOUS

## 2020-08-28 MED ORDER — ONDANSETRON HCL 4 MG/2ML IJ SOLN: 4.0000 mg | Freq: Four times a day (QID) | INTRAMUSCULAR | Status: DC | PRN

## 2020-08-28 MED ORDER — ACETAMINOPHEN 650 MG RE SUPP: 650.0000 mg | RECTAL | Status: DC | PRN

## 2020-08-28 MED ORDER — KETOROLAC TROMETHAMINE 15 MG/ML IJ SOLN
30.0000 mg | Freq: Four times a day (QID) | INTRAMUSCULAR | Status: DC
  Administered 2020-08-28 – 2020-08-29 (×3): 30 mg via INTRAVENOUS
  Filled 2020-08-28 (×3): qty 2

## 2020-08-28 MED ORDER — THROMBIN 20000 UNITS EX SOLR
CUTANEOUS | Status: AC
  Filled 2020-08-28: qty 20000

## 2020-08-28 MED ORDER — CITALOPRAM HYDROBROMIDE 20 MG PO TABS
40.0000 mg | ORAL_TABLET | Freq: Every evening | ORAL | Status: DC
  Administered 2020-08-28: 40 mg via ORAL
  Filled 2020-08-28: qty 2

## 2020-08-28 MED ORDER — MENTHOL 3 MG MT LOZG: 1.0000 | LOZENGE | OROMUCOSAL | Status: DC | PRN

## 2020-08-28 MED ORDER — HYDROMORPHONE HCL 1 MG/ML IJ SOLN: 1.0000 mg | INTRAMUSCULAR | Status: DC | PRN

## 2020-08-28 MED ORDER — SUGAMMADEX SODIUM 200 MG/2ML IV SOLN
INTRAVENOUS | Status: DC | PRN
  Administered 2020-08-28: 300 mg via INTRAVENOUS

## 2020-08-28 MED ORDER — THROMBIN 5000 UNITS EX SOLR
CUTANEOUS | Status: AC
  Filled 2020-08-28: qty 5000

## 2020-08-28 MED ORDER — PROPOFOL 500 MG/50ML IV EMUL
INTRAVENOUS | Status: DC | PRN
Start: 2020-08-28 — End: 2020-08-28
  Administered 2020-08-28: 25 ug/kg/min via INTRAVENOUS

## 2020-08-28 MED ORDER — LIDOCAINE 2% (20 MG/ML) 5 ML SYRINGE
INTRAMUSCULAR | Status: DC | PRN
  Administered 2020-08-28: 30 mg via INTRAVENOUS

## 2020-08-28 MED ORDER — ORAL CARE MOUTH RINSE: 15.0000 mL | Freq: Once | OROMUCOSAL | Status: AC

## 2020-08-28 MED ORDER — BUPIVACAINE HCL (PF) 0.25 % IJ SOLN
INTRAMUSCULAR | Status: AC
  Filled 2020-08-28: qty 30

## 2020-08-28 MED ORDER — 0.9 % SODIUM CHLORIDE (POUR BTL) OPTIME
TOPICAL | Status: DC | PRN
  Administered 2020-08-28: 1000 mL

## 2020-08-28 MED ORDER — CHLORHEXIDINE GLUCONATE CLOTH 2 % EX PADS: 6.0000 | MEDICATED_PAD | Freq: Once | CUTANEOUS | Status: DC

## 2020-08-28 MED ORDER — KETOROLAC TROMETHAMINE 30 MG/ML IJ SOLN
INTRAMUSCULAR | Status: DC | PRN
  Administered 2020-08-28: 30 mg via INTRAVENOUS

## 2020-08-28 MED ORDER — HYDROCODONE-ACETAMINOPHEN 5-325 MG PO TABS: 1.0000 | ORAL_TABLET | ORAL | Status: DC | PRN

## 2020-08-28 MED ORDER — FENTANYL CITRATE (PF) 250 MCG/5ML IJ SOLN
INTRAMUSCULAR | Status: AC
  Filled 2020-08-28: qty 5

## 2020-08-28 MED ORDER — HYDROCODONE-ACETAMINOPHEN 10-325 MG PO TABS
2.0000 | ORAL_TABLET | ORAL | Status: DC | PRN
  Administered 2020-08-28 – 2020-08-29 (×3): 2 via ORAL
  Filled 2020-08-28 (×3): qty 2

## 2020-08-28 MED ORDER — PIOGLITAZONE HCL 15 MG PO TABS
15.0000 mg | ORAL_TABLET | Freq: Every day | ORAL | Status: DC
  Administered 2020-08-28 – 2020-08-29 (×2): 15 mg via ORAL
  Filled 2020-08-28 (×2): qty 1

## 2020-08-28 MED ORDER — ONDANSETRON HCL 4 MG PO TABS: 4.0000 mg | ORAL_TABLET | Freq: Four times a day (QID) | ORAL | Status: DC | PRN

## 2020-08-28 MED ORDER — OXYCODONE HCL 5 MG PO TABS
5.0000 mg | ORAL_TABLET | Freq: Once | ORAL | Status: AC | PRN
  Administered 2020-08-28: 5 mg via ORAL

## 2020-08-28 MED ORDER — OXYCODONE HCL 5 MG PO TABS
ORAL_TABLET | ORAL | Status: AC
  Filled 2020-08-28: qty 1

## 2020-08-28 MED ORDER — CEFAZOLIN SODIUM-DEXTROSE 2-4 GM/100ML-% IV SOLN
2.0000 g | INTRAVENOUS | Status: AC
  Administered 2020-08-28: 2 g via INTRAVENOUS
  Filled 2020-08-28: qty 100

## 2020-08-28 MED ORDER — CYCLOBENZAPRINE HCL 10 MG PO TABS
10.0000 mg | ORAL_TABLET | Freq: Three times a day (TID) | ORAL | Status: DC | PRN
  Administered 2020-08-28 – 2020-08-29 (×2): 10 mg via ORAL
  Filled 2020-08-28 (×2): qty 1

## 2020-08-28 MED ORDER — ALBUMIN HUMAN 5 % IV SOLN: INTRAVENOUS | Status: DC | PRN

## 2020-08-28 MED ORDER — FENTANYL CITRATE (PF) 100 MCG/2ML IJ SOLN
INTRAMUSCULAR | Status: DC | PRN
  Administered 2020-08-28: 100 ug via INTRAVENOUS
  Administered 2020-08-28: 150 ug via INTRAVENOUS
  Administered 2020-08-28: 50 ug via INTRAVENOUS

## 2020-08-28 MED ORDER — FENTANYL CITRATE (PF) 100 MCG/2ML IJ SOLN
INTRAMUSCULAR | Status: AC
  Filled 2020-08-28: qty 2

## 2020-08-28 MED ORDER — MONTELUKAST SODIUM 10 MG PO TABS
10.0000 mg | ORAL_TABLET | Freq: Every day | ORAL | Status: DC
  Administered 2020-08-28: 10 mg via ORAL
  Filled 2020-08-28: qty 1

## 2020-08-28 MED ORDER — ROCURONIUM BROMIDE 100 MG/10ML IV SOLN
INTRAVENOUS | Status: DC | PRN
  Administered 2020-08-28: 30 mg via INTRAVENOUS
  Administered 2020-08-28: 70 mg via INTRAVENOUS

## 2020-08-28 MED ORDER — OXYCODONE HCL 5 MG/5ML PO SOLN
5.0000 mg | Freq: Once | ORAL | Status: AC | PRN
Start: 2020-08-28 — End: 2020-08-28

## 2020-08-28 MED ORDER — FENTANYL CITRATE (PF) 100 MCG/2ML IJ SOLN
25.0000 ug | INTRAMUSCULAR | Status: DC | PRN
  Administered 2020-08-28 (×2): 50 ug via INTRAVENOUS

## 2020-08-28 MED ORDER — PROPOFOL 10 MG/ML IV BOLUS
INTRAVENOUS | Status: DC | PRN
Start: 2020-08-28 — End: 2020-08-28
  Administered 2020-08-28: 200 mg via INTRAVENOUS

## 2020-08-28 MED ORDER — LORATADINE 10 MG PO TABS
10.0000 mg | ORAL_TABLET | Freq: Every day | ORAL | Status: DC
  Administered 2020-08-28 – 2020-08-29 (×2): 10 mg via ORAL
  Filled 2020-08-28 (×2): qty 1

## 2020-08-28 MED ORDER — KETOROLAC TROMETHAMINE 0.5 % OP SOLN
1.0000 [drp] | Freq: Four times a day (QID) | OPHTHALMIC | Status: DC
  Administered 2020-08-28 – 2020-08-29 (×3): 1 [drp] via OPHTHALMIC
  Filled 2020-08-28: qty 5

## 2020-08-28 MED ORDER — PHENYLEPHRINE HCL-NACL 20-0.9 MG/250ML-% IV SOLN
INTRAVENOUS | Status: DC | PRN
  Administered 2020-08-28: 25 ug/min via INTRAVENOUS

## 2020-08-28 MED ORDER — EPHEDRINE SULFATE 50 MG/ML IJ SOLN
INTRAMUSCULAR | Status: DC | PRN
  Administered 2020-08-28: 15 mg via INTRAVENOUS

## 2020-08-28 MED ORDER — MIDAZOLAM HCL 2 MG/2ML IJ SOLN
INTRAMUSCULAR | Status: AC
  Filled 2020-08-28: qty 2

## 2020-08-28 MED ORDER — CHLORHEXIDINE GLUCONATE 0.12 % MT SOLN
15.0000 mL | Freq: Once | OROMUCOSAL | Status: AC
  Administered 2020-08-28: 15 mL via OROMUCOSAL
  Filled 2020-08-28: qty 15

## 2020-08-28 MED ORDER — THROMBIN (RECOMBINANT) 20000 UNITS EX SOLR
CUTANEOUS | Status: AC
  Filled 2020-08-28: qty 20000

## 2020-08-28 MED ORDER — DEXAMETHASONE SODIUM PHOSPHATE 10 MG/ML IJ SOLN
10.0000 mg | Freq: Once | INTRAMUSCULAR | Status: DC
  Filled 2020-08-28: qty 1

## 2020-08-28 MED ORDER — ONDANSETRON HCL 4 MG/2ML IJ SOLN
INTRAMUSCULAR | Status: DC | PRN
  Administered 2020-08-28: 4 mg via INTRAVENOUS

## 2020-08-28 MED ORDER — LISINOPRIL 2.5 MG PO TABS
2.5000 mg | ORAL_TABLET | Freq: Every day | ORAL | Status: DC
  Administered 2020-08-28 – 2020-08-29 (×2): 2.5 mg via ORAL
  Filled 2020-08-28 (×2): qty 1

## 2020-08-28 MED ORDER — ONDANSETRON HCL 4 MG/2ML IJ SOLN: 4.0000 mg | Freq: Once | INTRAMUSCULAR | Status: DC | PRN

## 2020-08-28 MED ORDER — THROMBIN 20000 UNITS EX SOLR: CUTANEOUS | Status: DC | PRN

## 2020-08-28 MED ORDER — DEXAMETHASONE SODIUM PHOSPHATE 10 MG/ML IJ SOLN
INTRAMUSCULAR | Status: DC | PRN
  Administered 2020-08-28: 10 mg via INTRAVENOUS

## 2020-08-28 MED ORDER — ACETAMINOPHEN 325 MG PO TABS: 650.0000 mg | ORAL_TABLET | ORAL | Status: DC | PRN

## 2020-08-28 MED ORDER — PRAVASTATIN SODIUM 40 MG PO TABS
40.0000 mg | ORAL_TABLET | Freq: Every evening | ORAL | Status: DC
  Administered 2020-08-28: 40 mg via ORAL
  Filled 2020-08-28: qty 1

## 2020-08-28 MED ORDER — LACTATED RINGERS IV SOLN: INTRAVENOUS | Status: DC | PRN

## 2020-08-28 MED ORDER — INSULIN ASPART 100 UNIT/ML IJ SOLN
0.0000 [IU] | Freq: Every day | INTRAMUSCULAR | Status: DC
Start: 2020-08-28 — End: 2020-08-29
  Administered 2020-08-28: 4 [IU] via SUBCUTANEOUS

## 2020-08-28 MED ORDER — PROPOFOL 10 MG/ML IV BOLUS
INTRAVENOUS | Status: AC
  Filled 2020-08-28: qty 40

## 2020-08-28 MED ORDER — LACTATED RINGERS IV SOLN: INTRAVENOUS | Status: DC

## 2020-08-28 MED ORDER — THROMBIN 20000 UNITS EX KIT
PACK | CUTANEOUS | Status: AC
  Filled 2020-08-28: qty 1

## 2020-08-28 MED ORDER — SODIUM CHLORIDE 0.9 % IV SOLN
250.0000 mL | INTRAVENOUS | Status: DC
  Administered 2020-08-28: 250 mL via INTRAVENOUS

## 2020-08-28 MED ORDER — INSULIN ASPART 100 UNIT/ML IJ SOLN
0.0000 [IU] | Freq: Three times a day (TID) | INTRAMUSCULAR | Status: DC
  Administered 2020-08-29: 5 [IU] via SUBCUTANEOUS

## 2020-08-28 MED ORDER — PHENOL 1.4 % MT LIQD: 1.0000 | OROMUCOSAL | Status: DC | PRN

## 2020-08-28 MED ORDER — THROMBIN 5000 UNITS EX SOLR
CUTANEOUS | Status: DC | PRN
  Administered 2020-08-28: 5000 [IU] via TOPICAL

## 2020-08-28 MED ORDER — THROMBIN 20000 UNITS EX KIT: PACK | CUTANEOUS | Status: DC | PRN

## 2020-08-28 MED ORDER — CEFAZOLIN SODIUM-DEXTROSE 1-4 GM/50ML-% IV SOLN
1.0000 g | Freq: Three times a day (TID) | INTRAVENOUS | Status: AC
  Administered 2020-08-28 – 2020-08-29 (×2): 1 g via INTRAVENOUS
  Filled 2020-08-28 (×2): qty 50

## 2020-08-28 MED ORDER — SODIUM CHLORIDE 0.9% FLUSH: 3.0000 mL | INTRAVENOUS | Status: DC | PRN

## 2020-08-28 MED ORDER — BUPIVACAINE HCL (PF) 0.25 % IJ SOLN
INTRAMUSCULAR | Status: DC | PRN
  Administered 2020-08-28: 30 mL

## 2020-08-28 SURGICAL SUPPLY — 54 items
BAG COUNTER SPONGE SURGICOUNT (BAG) ×6 IMPLANT
BAG DECANTER FOR FLEXI CONT (MISCELLANEOUS) IMPLANT
BAND RUBBER #18 3X1/16 STRL (MISCELLANEOUS) IMPLANT
BENZOIN TINCTURE PRP APPL 2/3 (GAUZE/BANDAGES/DRESSINGS) ×2 IMPLANT
BLADE CLIPPER SURG (BLADE) ×2 IMPLANT
BUR CUTTER 7.0 ROUND (BURR) ×2 IMPLANT
CANISTER SUCT 3000ML PPV (MISCELLANEOUS) ×2 IMPLANT
CARTRIDGE OIL MAESTRO DRILL (MISCELLANEOUS) ×1 IMPLANT
CLSR STERI-STRIP ANTIMIC 1/2X4 (GAUZE/BANDAGES/DRESSINGS) ×2 IMPLANT
DECANTER SPIKE VIAL GLASS SM (MISCELLANEOUS) IMPLANT
DERMABOND ADHESIVE PROPEN (GAUZE/BANDAGES/DRESSINGS) ×1
DERMABOND ADVANCED (GAUZE/BANDAGES/DRESSINGS) ×1
DERMABOND ADVANCED .7 DNX12 (GAUZE/BANDAGES/DRESSINGS) ×1 IMPLANT
DERMABOND ADVANCED .7 DNX6 (GAUZE/BANDAGES/DRESSINGS) ×1 IMPLANT
DIFFUSER DRILL AIR PNEUMATIC (MISCELLANEOUS) ×2 IMPLANT
DRAPE HALF SHEET 40X57 (DRAPES) IMPLANT
DRAPE LAPAROTOMY 100X72X124 (DRAPES) ×2 IMPLANT
DRAPE MICROSCOPE LEICA (MISCELLANEOUS) IMPLANT
DRAPE SURG 17X23 STRL (DRAPES) ×4 IMPLANT
DRSG OPSITE POSTOP 4X8 (GAUZE/BANDAGES/DRESSINGS) ×2 IMPLANT
DURAPREP 26ML APPLICATOR (WOUND CARE) ×2 IMPLANT
ELECT BLADE 4.0 EZ CLEAN MEGAD (MISCELLANEOUS) ×2
ELECT REM PT RETURN 9FT ADLT (ELECTROSURGICAL) ×2
ELECTRODE BLDE 4.0 EZ CLN MEGD (MISCELLANEOUS) ×1 IMPLANT
ELECTRODE REM PT RTRN 9FT ADLT (ELECTROSURGICAL) ×1 IMPLANT
EVACUATOR 3/16  PVC DRAIN (DRAIN) ×1
EVACUATOR 3/16 PVC DRAIN (DRAIN) ×1 IMPLANT
GAUZE 4X4 16PLY ~~LOC~~+RFID DBL (SPONGE) ×2 IMPLANT
GAUZE SPONGE 4X4 12PLY STRL (GAUZE/BANDAGES/DRESSINGS) ×2 IMPLANT
GLOVE EXAM NITRILE XL STR (GLOVE) IMPLANT
GLOVE SURG LTX SZ9 (GLOVE) ×2 IMPLANT
GOWN STRL REUS W/ TWL LRG LVL3 (GOWN DISPOSABLE) IMPLANT
GOWN STRL REUS W/ TWL XL LVL3 (GOWN DISPOSABLE) ×1 IMPLANT
GOWN STRL REUS W/TWL 2XL LVL3 (GOWN DISPOSABLE) IMPLANT
GOWN STRL REUS W/TWL LRG LVL3 (GOWN DISPOSABLE)
GOWN STRL REUS W/TWL XL LVL3 (GOWN DISPOSABLE) ×1
HEMOSTAT POWDER SURGIFOAM 1G (HEMOSTASIS) ×4 IMPLANT
KIT BASIN OR (CUSTOM PROCEDURE TRAY) ×2 IMPLANT
KIT TURNOVER KIT B (KITS) ×2 IMPLANT
NEEDLE HYPO 22GX1.5 SAFETY (NEEDLE) ×2 IMPLANT
NEEDLE SPNL 22GX3.5 QUINCKE BK (NEEDLE) IMPLANT
NS IRRIG 1000ML POUR BTL (IV SOLUTION) ×2 IMPLANT
OIL CARTRIDGE MAESTRO DRILL (MISCELLANEOUS) ×2
PACK LAMINECTOMY NEURO (CUSTOM PROCEDURE TRAY) ×2 IMPLANT
PAD ARMBOARD 7.5X6 YLW CONV (MISCELLANEOUS) ×6 IMPLANT
SPONGE SURGIFOAM ABS GEL SZ50 (HEMOSTASIS) ×2 IMPLANT
SPONGE T-LAP 4X18 ~~LOC~~+RFID (SPONGE) ×8 IMPLANT
STRIP CLOSURE SKIN 1/2X4 (GAUZE/BANDAGES/DRESSINGS) ×2 IMPLANT
SUT VIC AB 2-0 CT1 18 (SUTURE) ×4 IMPLANT
SUT VIC AB 3-0 SH 8-18 (SUTURE) ×4 IMPLANT
SYR 20ML LL LF (SYRINGE) ×2 IMPLANT
TOWEL GREEN STERILE (TOWEL DISPOSABLE) ×2 IMPLANT
TOWEL GREEN STERILE FF (TOWEL DISPOSABLE) ×2 IMPLANT
WATER STERILE IRR 1000ML POUR (IV SOLUTION) ×2 IMPLANT

## 2020-08-28 NOTE — Anesthesia Preprocedure Evaluation (Addendum)
Anesthesia Evaluation  Patient identified by MRN, date of birth, ID band Patient awake    Reviewed: Allergy & Precautions, NPO status , Patient's Chart, lab work & pertinent test results  History of Anesthesia Complications Negative for: history of anesthetic complications  Airway Mallampati: II  TM Distance: >3 FB Neck ROM: Full    Dental  (+) Dental Advisory Given, Teeth Intact   Pulmonary sleep apnea ,    Pulmonary exam normal        Cardiovascular hypertension, Pt. on medications Normal cardiovascular exam     Neuro/Psych PSYCHIATRIC DISORDERS Depression negative neurological ROS     GI/Hepatic negative GI ROS, Neg liver ROS,   Endo/Other  diabetes, Type 2, Oral Hypoglycemic Agents Obesity K 5.4 on 08/21/20   Renal/GU negative Renal ROS     Musculoskeletal  (+) Arthritis ,   Abdominal   Peds  Hematology negative hematology ROS (+)   Anesthesia Other Findings Covid test negative   Reproductive/Obstetrics                           Anesthesia Physical Anesthesia Plan  ASA: 3  Anesthesia Plan: General   Post-op Pain Management:    Induction: Intravenous  PONV Risk Score and Plan: 3 and Treatment may vary due to age or medical condition, Ondansetron, Dexamethasone and Propofol infusion  Airway Management Planned: Oral ETT  Additional Equipment: None  Intra-op Plan:   Post-operative Plan: Extubation in OR  Informed Consent: I have reviewed the patients History and Physical, chart, labs and discussed the procedure including the risks, benefits and alternatives for the proposed anesthesia with the patient or authorized representative who has indicated his/her understanding and acceptance.     Dental advisory given  Plan Discussed with: CRNA and Anesthesiologist  Anesthesia Plan Comments:        Anesthesia Quick Evaluation

## 2020-08-28 NOTE — Anesthesia Procedure Notes (Signed)
Procedure Name: Intubation Date/Time: 08/28/2020 10:06 AM Performed by: Gwenyth Allegra, CRNA Pre-anesthesia Checklist: Patient identified, Emergency Drugs available, Suction available, Patient being monitored and Timeout performed Patient Re-evaluated:Patient Re-evaluated prior to induction Preoxygenation: Pre-oxygenation with 100% oxygen Induction Type: IV induction Ventilation: Mask ventilation without difficulty and Oral airway inserted - appropriate to patient size Laryngoscope Size: Glidescope Grade View: Grade II Tube type: Oral Tube size: 8.0 mm Placement Confirmation: ETT inserted through vocal cords under direct vision, positive ETCO2 and breath sounds checked- equal and bilateral Secured at: 22 cm Tube secured with: Tape Dental Injury: Teeth and Oropharynx as per pre-operative assessment

## 2020-08-28 NOTE — Op Note (Signed)
Date of procedure: 08/28/2020  Date of dictation: Same  Service: Neurosurgery  Preoperative diagnosis: Lumbar stenosis with neurogenic claudication  Postoperative diagnosis: Same  Procedure Name: L2-3, L3-4, L4-5, L5-S1 decompressive laminectomy with foraminotomies  Surgeon:Vi Biddinger A.Natale Barba, M.D.  Asst. Surgeon: Doran Durand, NP  Anesthesia: General  Indication: 70 year old male with bilateral lower extremity numbness and progressive weakness with intermittent radicular pain.  Work-up demonstrates evidence of progressively worsening spinal stenosis with marked constriction of the thecal sac and compression upon the cauda equina at the L2-3, L3-4 and L4-5 and L5-S1 levels.  Patient with elements of a chronic disc herniation at L4-5 and L5S1 further causing stenosis.  Patient presents now for decompressive surgery with possible microdiscectomies.  Operative note: After induction of anesthesia, patient position prone on the Wilson frame appropriate padded.  Lumbar region prepped and draped sterilely.  Incision made overlying L2-3-4 5 and S1.  Dissection performed bilaterally.  Retractor placed.  X-ray taken.  Level confirmed.  Decompressive laminectomy then performed using [place] [facility] and high-speed drill to remove the spinous processes and lamina of L3-L4 and L5 and the superior aspect of the lamina of S1.  The inferior two thirds of the lamina of L2 was also resected.   Ligament flavum elevated and resected.Medial facetectomies at L2-3, L3-4 L4-5 and L5-S1 were performed.  Foraminotomies were completed on the course exiting L2, L3, L4, L5 and S1 nerve roots bilaterally.  Disc spaces at L4-5 and L5-S1 were dissected free and inspected.  The disc space at both levels were calcified and I did not see any benefit from incising or removing the osteophytes.  The wound was then irrigated.  Gelfoam was placed topically for hemostasis.  A medium Hemovac drain was left in the epidural space.  Wounds  then closed in layers of Vicryl sutures.  Steri-Strips and sterile dressing were applied.  No apparent complications.  Patient tolerated the procedure well and he returns to the recovery room postop.

## 2020-08-28 NOTE — Anesthesia Postprocedure Evaluation (Signed)
Anesthesia Post Note  Patient: Albert Benjamin  Procedure(s) Performed: Laminectomy and Foraminotomy - Lumbar Two-Lumbar Three, Lumbar Three-Lumbar Four, Lumbar Four-Lumbar Five,  Lumbar Five-Sacral One (Back)     Patient location during evaluation: PACU Anesthesia Type: General Level of consciousness: awake and alert Pain management: pain level controlled Vital Signs Assessment: post-procedure vital signs reviewed and stable Respiratory status: spontaneous breathing, nonlabored ventilation and respiratory function stable Cardiovascular status: stable and blood pressure returned to baseline Anesthetic complications: no   No notable events documented.  Last Vitals:  Vitals:   08/28/20 1405 08/28/20 1434  BP: 111/64 118/79  Pulse:  92  Resp: 13 20  Temp: (!) 36.3 C 36.5 C  SpO2: 97% 97%    Last Pain:  Vitals:   08/28/20 1434  TempSrc: Oral  PainSc:                  Beryle Lathe

## 2020-08-28 NOTE — H&P (Signed)
Albert Benjamin is an 70 y.o. male.   Chief Complaint: Weakness HPI: 70 year old male with bilateral lower extremity numbness and weakness worse with standing standing and ambulation.  Patient with some coexistent back pain.  Some intermittent radicular pain.  Work-up demonstrates evidence of severe multifactorial stenosis at L2-3, L3-4, L4-5 and L5-S1 with chronic disc herniations at L4-5 and L5-S1.  Patient has failed conservative management presents now for multilevel decompressive surgery in hopes of improving his symptoms.  Past Medical History:  Diagnosis Date   Allergy    Aphakia, left eye 02/03/2020   Secondary insertion of posterior chamber intraocular lens via Yamani scleral tunnel technique 02/02/2020 left eye   Arthritis    Cataract    bilkateral surgery 3-40 years ago   Depression    Diabetes mellitus    Hypertension    Obstructive sleep apnea on CPAP    Pneumonia    Sleep apnea    does not use CPAP   Vitreous prolapse, left eye 01/11/2020   Wears glasses    Wears hearing aid    both ears    Past Surgical History:  Procedure Laterality Date   ANTERIOR CERVICAL DECOMP/DISCECTOMY FUSION N/A 03/11/2012   Procedure: ANTERIOR CERVICAL DECOMPRESSION/DISCECTOMY FUSION 3 LEVELS;  Surgeon: Karn Cassis, MD;  Location: [facility];  Service: Neurosurgery;  Laterality: N/A;  Anterior cervical decompression/fusion Cervical four-five, Cervical five-six, Cervical six-seven   COLONOSCOPY     EYE SURGERY     catarct bil   KNEE ARTHROSCOPY  2007   left, right 09/2011   SHOULDER ARTHROSCOPY  [id number]   [id number]-dsc    Family History  Problem Relation Age of Onset   Diabetes Mother    Colon cancer Neg Hx    Heart disease Neg Hx    Esophageal cancer Neg Hx    Pancreatic cancer Neg Hx    Stomach cancer Neg Hx    Rectal cancer Neg Hx    Social History:  reports that he has never smoked. He has never used smokeless tobacco. He reports that he does not drink alcohol and does  not use drugs.  Allergies: No Known Allergies  Medications Prior to Admission  Medication Sig Dispense Refill   cetirizine (ZYRTEC) 10 MG tablet Take 10 mg by mouth daily.     citalopram (CELEXA) 40 MG tablet Take 40 mg by mouth every evening.     ketorolac (ACULAR) 0.5 % ophthalmic solution Place 1 drop into the left eye 4 (four) times daily. 5 mL 0   lisinopril (ZESTRIL) 2.5 MG tablet Take 2.5 mg by mouth daily.     montelukast (SINGULAIR) 10 MG tablet Take 10 mg by mouth at bedtime.     pioglitazone (ACTOS) 15 MG tablet Take 15 mg by mouth daily.     pravastatin (PRAVACHOL) 40 MG tablet Take 40 mg by mouth every evening.      Results for orders placed or performed during the hospital encounter of 08/28/20 (from the past 48 hour(s))  Basic metabolic panel     Status: Abnormal   Collection Time: 08/28/20  7:45 AM  Result Value Ref Range   Sodium 138 135 - 145 mmol/L   Potassium 4.8 3.5 - 5.1 mmol/L   Chloride 107 98 - 111 mmol/L   CO2 24 22 - 32 mmol/L   Glucose, Bld 139 (H) 70 - 99 mg/dL    Comment: Glucose reference range applies only to samples taken after fasting for at least 8 hours.  BUN 33 (H) 8 - 23 mg/dL   Creatinine, Ser 6.38 0.61 - 1.24 mg/dL   Calcium 9.2 8.9 - 75.6 mg/dL   GFR, Estimated >43 >32 mL/min    Comment: (NOTE) Calculated using the CKD-EPI Creatinine Equation (2021)    Anion gap 7 5 - 15    Comment: Performed at [facility], [address], [place], [place] [address]  Glucose, capillary     Status: Abnormal   Collection Time: 08/28/20  7:50 AM  Result Value Ref Range   Glucose-Capillary 139 (H) 70 - 99 mg/dL    Comment: Glucose reference range applies only to samples taken after fasting for at least 8 hours.   No results found.  Pertinent items noted in HPI and remainder of comprehensive ROS otherwise negative.  Blood pressure 131/84, pulse 81, temperature 98.4 F (36.9 C), temperature source Oral, resp. rate 19, height 5\' 7"  (1.702 m),  weight 108.9 kg, SpO2 97 %.  Patient is awake and alert.  He is oriented and appropriate.  Speech is fluent.  Judgment insight are intact.  Cranial nerve function normal bilateral.  Motor examination extremities reveals normal motor strength without any asymmetry.  Sensory examination with some patchy distal sensory loss in both legs and feet.  Deep tendon versus hypoactive but symmetric.  No evidence of long track signs.  Gait is somewhat slow but posture is reasonably normal peer examination head ears eyes nose throat is unremarkable chest and abdomen are benign.  Extremities are free from injury or deformity. Assessment/Plan L2-3, L3-4, L4-5, L5-S1 stenosis with neurogenic claudication.  Plan L2-3, L3-4, L4-5, L5-S1 decompressive laminectomies with foraminotomies.  Possible right-sided L4-5 and L5-S1 microdiscectomies.  Risks and benefits of been explained.  Patient wishes to proceed.  Timia Casselman 08/28/2020, 9:47 AM

## 2020-08-28 NOTE — Transfer of Care (Signed)
Immediate Anesthesia Transfer of Care Note  Patient: Albert Benjamin  Procedure(s) Performed: Laminectomy and Foraminotomy - Lumbar Two-Lumbar Three, Lumbar Three-Lumbar Four, Lumbar Four-Lumbar Five,  Lumbar Five-Sacral One (Back)  Patient Location: PACU  Anesthesia Type:General  Level of Consciousness: drowsy and patient cooperative  Airway & Oxygen Therapy: Patient Spontanous Breathing and Patient connected to nasal cannula oxygen  Post-op Assessment: Report given to RN and Post -op Vital signs reviewed and stable  Post vital signs: Reviewed and stable  Last Vitals:  Vitals Value Taken Time  BP 113/71 08/28/20 1320  Temp 37.1 C 08/28/20 1320  Pulse 91 08/28/20 1322  Resp 17 08/28/20 1322  SpO2 99 % 08/28/20 1322  Vitals shown include unvalidated device data.  Last Pain:  Vitals:   08/28/20 0759  TempSrc:   PainSc: 0-No pain         Complications: No notable events documented.

## 2020-08-28 NOTE — Brief Op Note (Signed)
08/28/2020  1:02 PM  PATIENT:  Albert Benjamin  70 y.o. male  PRE-OPERATIVE DIAGNOSIS:  Stenosis Lumbar  POST-OPERATIVE DIAGNOSIS:  Stenosis Lumbar  PROCEDURE:  Procedure(s): Laminectomy and Foraminotomy - Lumbar Two-Lumbar Three, Lumbar Three-Lumbar Four, Lumbar Four-Lumbar Five,  Lumbar Five-Sacral One (N/A)  SURGEON:  Surgeon(s) and Role:    * Julio Sicks, MD - Primary  PHYSICIAN ASSISTANT:   ASSISTANTSMarland Mcalpine   ANESTHESIA:   general  EBL:  550 mL   BLOOD ADMINISTERED:none  DRAINS: (med) Hemovact drain(s) in the deep wound space with  Suction Open   LOCAL MEDICATIONS USED:  MARCAINE     SPECIMEN:  No Specimen  DISPOSITION OF SPECIMEN:  N/A  COUNTS:  YES  TOURNIQUET:  * No tourniquets in log *  DICTATION: .Dragon Dictation  PLAN OF CARE: Admit for overnight observation  PATIENT DISPOSITION:  PACU - hemodynamically stable.   Delay start of Pharmacological VTE agent (>24hrs) due to surgical blood loss or risk of bleeding: yes

## 2020-08-29 ENCOUNTER — Encounter (HOSPITAL_COMMUNITY): Payer: Self-pay | Admitting: Neurosurgery

## 2020-08-29 LAB — GLUCOSE, CAPILLARY: Glucose-Capillary: 201 mg/dL — ABNORMAL HIGH (ref 70–99)

## 2020-08-29 MED ORDER — HYDROCODONE-ACETAMINOPHEN 10-325 MG PO TABS: 1.0000 | ORAL_TABLET | ORAL | 0 refills | Status: DC | PRN

## 2020-08-29 MED ORDER — CYCLOBENZAPRINE HCL 10 MG PO TABS: 10.0000 mg | ORAL_TABLET | Freq: Three times a day (TID) | ORAL | 0 refills | Status: DC | PRN

## 2020-08-29 MED FILL — Thrombin For Soln Kit 20000 Unit: CUTANEOUS | Qty: 1 | Status: CN

## 2020-08-29 NOTE — Plan of Care (Signed)
Adequately Ready for Discharge 

## 2020-08-29 NOTE — Evaluation (Signed)
Occupational Therapy Evaluation/Discharge Patient Details Name: Albert Benjamin MRN: [id number] DOB: June 08, 1950 Today's Date: 08/29/2020    History of Present Illness Pt is a 70 y.o male with progressive LE numbness and weakness, as well as back pain. Imaging shoes severe multifactorial stenosis at L2-3, L3-4, L4-5, and L5-S1 with chronic disc herniations. Pt elected to undergo multilevel decompressive surgery. PMH: DM, HTN, OSA on CPAP   Clinical Impression   PTA, pt lives with spouse and reports typically Independent with ADLs and mobility without AD though endorses falls due to R LE numbness (worsens with activity). Pt presents now with continued R LE numbness. Educated pt on spinal precautions for ADLs/transfers with pt able to return demo tasks with Modified Independence. Pt with good problem solving and safety awareness - reports feeling more comfortable using RW at this time. Wife present and endorses that she can assist pt as needed at home. No further skilled OT services needed at acute level or on DC.     Follow Up Recommendations  No OT follow up;Supervision - Intermittent    Equipment Recommendations  None recommended by OT    Recommendations for Other Services       Precautions / Restrictions Precautions Precautions: Fall;Back Precaution Booklet Issued: Yes (comment) Precaution Comments: no brace needed per orders Restrictions Weight Bearing Restrictions: No      Mobility Bed Mobility Overal bed mobility: Modified Independent             General bed mobility comments: Pt received sitting EOB but had questions about log rolling.  Guided pt in techniques with good carryover - use of bedrails    Transfers Overall transfer level: Independent Equipment used: None             General transfer comment: no use of AD to stand at bedside with dressing tasks though pt holding to bedrail    Balance Overall balance assessment: Needs assistance Sitting-balance  support: No upper extremity supported;Feet supported Sitting balance-Leahy Scale: Fair     Standing balance support: Single extremity supported;No upper extremity supported;During functional activity Standing balance-Leahy Scale: Fair Standing balance comment: fair static standing but reliant on at least one UE support with dynamic ADL tasks in standing                           ADL either performed or assessed with clinical judgement   ADL Overall ADL's : Modified independent                                       General ADL Comments: After education, pt able to demo LB dressing without physical assist. Good problem solving and receptive to education on techniques. Educated on use of bench in shower for LB bathing, body mechanics with ADL transfers, accessing items in kitchen     Vision Baseline Vision/History: 1 Wears glasses;4 Cataracts Ability to See in Adequate Light: 0 Adequate Patient Visual Report: No change from baseline Vision Assessment?: No apparent visual deficits     Perception     Praxis      Pertinent Vitals/Pain Pain Assessment: Faces Faces Pain Scale: Hurts a little bit Pain Location: back at incision site Pain Descriptors / Indicators: Sore Pain Intervention(s): Monitored during session     Hand Dominance Right   Extremity/Trunk Assessment Upper Extremity Assessment Upper Extremity Assessment: RUE deficits/detail RUE Deficits /  Details: hx of nerve injury and sx. AROM WFL though pt reports unable to hold/lift items in hand 3-/5 strength RUE Coordination: WNL   Lower Extremity Assessment Lower Extremity Assessment: Defer to PT evaluation;RLE deficits/detail RLE Deficits / Details: R LE numbness   Cervical / Trunk Assessment Cervical / Trunk Assessment: Normal   Communication Communication Communication: No difficulties   Cognition Arousal/Alertness: Awake/alert Behavior During Therapy: WFL for tasks  assessed/performed Overall Cognitive Status: Within Functional Limits for tasks assessed                                     General Comments  Wife present and supportive    Exercises     Shoulder Instructions      Home Living Family/patient expects to be discharged to:: Private residence Living Arrangements: Spouse/significant other Available Help at Discharge: Family;Available 24 hours/day Type of Home: House Home Access: Stairs to enter [employer] of Steps: 3 Entrance Stairs-Rails: Left Home Layout: Two level;Able to live on main level with bedroom/bathroom (wife's work area is upstairs)     Oncologist: Producer, television/film/video: Handicapped height     Home Equipment: Environmental consultant - 2 wheels;Cane - single point;Shower seat - built in;Grab bars - tub/shower;Hand held shower head          Prior Functioning/Environment Level of Independence: Independent        Comments: typically independent with mobility though endorses hx of falls due to R LE numbness with prolonged activity. Independent with ADLs. Wife does IADls        OT Problem List:        OT Treatment/Interventions:      OT Goals(Current goals can be found in the care plan section) Acute Rehab OT Goals Patient Stated Goal: improved sensation in RLE OT Goal Formulation: All assessment and education complete, DC therapy  OT Frequency:     Barriers to D/C:            Co-evaluation              AM-PAC OT "6 Clicks" Daily Activity     Outcome Measure Help from another person eating meals?: None Help from another person taking care of personal grooming?: None Help from another person toileting, which includes using toliet, bedpan, or urinal?: None Help from another person bathing (including washing, rinsing, drying)?: None Help from another person to put on and taking off regular upper body clothing?: None Help from another person to put on and taking off  regular lower body clothing?: None 6 Click Score: 24   End of Session Nurse Communication: Mobility status  Activity Tolerance: Patient tolerated treatment well Patient left: in bed;with call bell/phone within reach;with family/visitor present  OT Visit Diagnosis: Unsteadiness on feet (R26.81);Other abnormalities of gait and mobility (R26.89)                Time: 7824-2353 OT Time Calculation (min): 23 min Charges:  OT General Charges $OT Visit: 1 Visit OT Evaluation $OT Eval Low Complexity: 1 Low OT Treatments $Self Care/Home Management : 8-22 mins  Bradd Canary, OTR/L Acute Rehab Services Office: [phone]   Lorre Munroe 08/29/2020, 9:15 AM

## 2020-08-29 NOTE — Discharge Summary (Addendum)
Physician Discharge Summary     Providing Compassionate, Quality Care - Together   Patient ID: Albert Benjamin MRN: [id number] DOB/AGE: 71-Dec-1952 71 y.o.  Admit date: 08/28/2020 Discharge date: 08/29/2020  Admission Diagnoses: Lumbar stenosis with neurogenic claudication  Discharge Diagnoses:  Active Problems:   Lumbar stenosis with neurogenic claudication   Discharged Condition: good  Hospital Course: Patient underwent L2-3, L3-4, L4-5, L5-S1 decompressive laminectomy with foraminotomies by Dr. Jordan Likes on 08/28/2020. He was admitted to 3C06 following recovery from anesthesia in the PACU. His postoperative course has been uncomplicated. He has worked with both physical and occupational therapies who feel the patient is ready for discharge home. He is ambulating independently and without difficulty. He is tolerating a normal diet. He is not having any bowel or bladder dysfunction. His pain is well-controlled with oral pain medication. He is ready for discharge home.  Consults: None  Significant Diagnostic Studies: radiology: DG Lumbar Spine 1 View  Result Date: 08/28/2020 CLINICAL DATA:  L2-S1 laminectomy EXAM: LUMBAR SPINE - 1 VIEW COMPARISON:  05/10/2020 FINDINGS: Single cross-table lateral view of the lumbar spine demonstrates posterior instruments directed at the L4-5 disc space. IMPRESSION: Intraoperative localization as above. Electronically Signed   By: Charlett Nose M.D.   On: 08/28/2020 16:17     Treatments: surgery: L2-3, L3-4, L4-5, L5-S1 decompressive laminectomy with foraminotomies  Discharge Exam: Blood pressure 116/70, pulse 75, temperature 98 F (36.7 C), temperature source Oral, resp. rate 18, height 5\' 7"  (1.702 m), weight 108.9 kg, SpO2 100 %.  Alert and oriented x 4 PERRLA CN II-XII grossly intact MAE, Strength and sensation intact Incision is covered with Honeycomb dressing and Steri Strips; Dressing is clean, dry, and intact   Disposition: Discharge  disposition: 01-Home or Self Care        Allergies as of 08/29/2020   No Known Allergies      Medication List     TAKE these medications    cetirizine 10 MG tablet Commonly known as: ZYRTEC Take 10 mg by mouth daily.   citalopram 40 MG tablet Commonly known as: CELEXA Take 40 mg by mouth every evening.   cyclobenzaprine 10 MG tablet Commonly known as: FLEXERIL Take 1 tablet (10 mg total) by mouth 3 (three) times daily as needed for muscle spasms.   HYDROcodone-acetaminophen 10-325 MG tablet Commonly known as: NORCO Take 1 tablet by mouth every 4 (four) hours as needed for severe pain ((score 7 to 10)).   ketorolac 0.5 % ophthalmic solution Commonly known as: ACULAR Place 1 drop into the left eye 4 (four) times daily.   lisinopril 2.5 MG tablet Commonly known as: ZESTRIL Take 2.5 mg by mouth daily.   montelukast 10 MG tablet Commonly known as: SINGULAIR Take 10 mg by mouth at bedtime.   pioglitazone 15 MG tablet Commonly known as: ACTOS Take 15 mg by mouth daily.   pravastatin 40 MG tablet Commonly known as: PRAVACHOL Take 40 mg by mouth every evening.        Follow-up Information     08/31/2020, MD. Go on 09/07/2020.   Specialty: Neurosurgery Why: First post op appointment is 09/07/2020 at 3:15 PM. Contact information: [address] [place] [phone]                 Signed: [phone], DNP, AGNP-C Nurse Practitioner  [facility] [address], [address], [place], [place] P: [phone]    F: [phone]  08/29/2020, 11:09 AM

## 2020-08-29 NOTE — Progress Notes (Signed)
Pt. discharged home accompanied by spouse. Prescriptions and discharge instructions given with verbalization of understanding. Incision site on back with no s/s of infection - no swelling, redness, bleeding, and/or drainage noted. Opportunity given to ask questions but no question asked. Extra dressing given to patient for home use. Pt. transported out of this unit in wheelchair by the volunteer

## 2020-08-29 NOTE — Evaluation (Signed)
Physical Therapy Evaluation and Discharge Patient Details Name: Albert Benjamin MRN: [id number] DOB: [birth year] Today's Date: 08/29/2020   History of Present Illness  Pt is a 70 y/o male with progressive LE numbness and weakness, as well as back pain. Pt elected to undergo L2-S1 decompressive surgery on 08/28/2020. PMH significant for DM, HTN, OSA on CPAP.   Clinical Impression  Patient evaluated by Physical Therapy with no further acute PT needs identified. All education has been completed and the patient has no further questions. Pt was able to demonstrate transfers and ambulation with gross modified independence with occasional supervision for safety and RW for support. Pt plans to utilize rollator at home. Pt was educated on precautions, appropriate activity progression, and car transfer. See below for any follow-up Physical Therapy or equipment needs. PT is signing off. Thank you for this referral.     Follow Up Recommendations No PT follow up;Supervision for mobility/OOB    Equipment Recommendations  None recommended by PT    Recommendations for Other Services       Precautions / Restrictions Precautions Precautions: Fall;Back Precaution Booklet Issued: Yes (comment) Precaution Comments: no brace needed per orders Restrictions Weight Bearing Restrictions: No      Mobility  Bed Mobility Overal bed mobility: Modified Independent             General bed mobility comments: Verbally reviewed log roll technique. Pt received sitting EOB.    Transfers Overall transfer level: Modified independent Equipment used: None             General transfer comment: No assist required to power-up to full stand. Increased time.  Ambulation/Gait Ambulation/Gait assistance: Modified independent (Device/Increase time) Gait Distance (Feet): 300 Feet Assistive device: Rolling walker (2 wheeled) Gait Pattern/deviations: Step-through pattern;Decreased stride length;Trunk flexed Gait  velocity: Decreased Gait velocity interpretation: 1.31 - 2.62 ft/sec, indicative of limited community ambulator General Gait Details: VC's for improved posture, closer walker proximity, and foward gaze. No assist required and no overt LOB noted.  Stairs Stairs:  (Pt declined stair training.)          Wheelchair Mobility    Modified Rankin (Stroke Patients Only)       Balance Overall balance assessment: Needs assistance Sitting-balance support: No upper extremity supported;Feet supported Sitting balance-Leahy Scale: Fair     Standing balance support: Single extremity supported;No upper extremity supported;During functional activity Standing balance-Leahy Scale: Fair Standing balance comment: fair static standing but reliant on at least one UE support with dynamic ADL tasks in standing                             Pertinent Vitals/Pain Pain Assessment: Faces Faces Pain Scale: Hurts a little bit Pain Location: back at incision site Pain Descriptors / Indicators: Sore Pain Intervention(s): Monitored during session    Home Living Family/patient expects to be discharged to:: Private residence Living Arrangements: Spouse/significant other Available Help at Discharge: Family;Available 24 hours/day Type of Home: House Home Access: Stairs to enter Entrance Stairs-Rails: Left Entrance Stairs-Number of Steps: 3 Home Layout: Two level;Able to live on main level with bedroom/bathroom (wife's work area is upstairs) Home Equipment: Environmental consultant - 2 wheels;Cane - single point;Shower seat - built in;Grab bars - tub/shower;Hand held shower head      Prior Function Level of Independence: Independent         Comments: typically independent with mobility though endorses hx of falls due to R LE numbness with  prolonged activity. Independent with ADLs. Wife does IADls     Hand Dominance   Dominant Hand: Right    Extremity/Trunk Assessment   Upper Extremity Assessment Upper  Extremity Assessment: Defer to OT evaluation RUE Deficits / Details: hx of nerve injury and sx RUE Coordination: WNL    Lower Extremity Assessment Lower Extremity Assessment: Generalized weakness (Consistent with pre-op diagnosis. Pt reports lingering RLE numbness with mild tingling in the LLE.) RLE Deficits / Details: R LE numbness    Cervical / Trunk Assessment Cervical / Trunk Assessment: Other exceptions Cervical / Trunk Exceptions: s/p surgery  Communication   Communication: No difficulties  Cognition Arousal/Alertness: Awake/alert Behavior During Therapy: WFL for tasks assessed/performed Overall Cognitive Status: Within Functional Limits for tasks assessed                                        General Comments General comments (skin integrity, edema, etc.): Wife present and supportive    Exercises     Assessment/Plan    PT Assessment Patent does not need any further PT services  PT Problem List         PT Treatment Interventions      PT Goals (Current goals can be found in the Care Plan section)  Acute Rehab PT Goals Patient Stated Goal: improved sensation in RLE PT Goal Formulation: All assessment and education complete, DC therapy    Frequency     Barriers to discharge        Co-evaluation               AM-PAC PT "6 Clicks" Mobility  Outcome Measure Help needed turning from your back to your side while in a flat bed without using bedrails?: None Help needed moving from lying on your back to sitting on the side of a flat bed without using bedrails?: None Help needed moving to and from a bed to a chair (including a wheelchair)?: None Help needed standing up from a chair using your arms (e.g., wheelchair or bedside chair)?: None Help needed to walk in hospital room?: A Little Help needed climbing 3-5 steps with a railing? : A Little 6 Click Score: 22    End of Session Equipment Utilized During Treatment: Gait belt Activity  Tolerance: Patient tolerated treatment well Patient left: in bed;with call bell/phone within reach;with family/visitor present (Sitting EOB) Nurse Communication: Mobility status PT Visit Diagnosis: Unsteadiness on feet (R26.81);Pain    Time: 1025-1047 PT Time Calculation (min) (ACUTE ONLY): 22 min   Charges:   PT Evaluation $PT Eval Low Complexity: 1 Low          Albert Benjamin, PT, DPT Acute Rehabilitation Services Pager: [phone] Office: [phone]   Marylynn Pearson 08/29/2020, 12:31 PM

## 2020-08-29 NOTE — Discharge Instructions (Signed)
Wound Care Keep incision covered and dry for two days.    Do not put any creams, lotions, or ointments on incision. Leave steri-strips on back.  They will fall off by themselves.  Activity Walk each and every day, increasing distance each day. No lifting greater than 5 lbs. No driving for 2 weeks; may ride as a passenger locally.  Diet Resume your normal diet.   Return to Work Will be discussed at your follow up appointment.  Call Your Doctor If Any of These Occur Redness, drainage, or swelling at the wound.  Temperature greater than 101 degrees. Severe pain not relieved by pain medication. Incision starts to come apart.  Follow Up Appt 09/07/2020 3:15 PM

## 2020-09-02 ENCOUNTER — Emergency Department (HOSPITAL_COMMUNITY): Payer: PPO

## 2020-09-02 ENCOUNTER — Emergency Department (HOSPITAL_COMMUNITY)
Admission: EM | Admit: 2020-09-02 | Discharge: 2020-09-03 | Disposition: A | Discharge status: Home / Self Care | Payer: PPO | Attending: Emergency Medicine | Admitting: Emergency Medicine

## 2020-09-02 ENCOUNTER — Encounter (HOSPITAL_COMMUNITY): Payer: Self-pay | Admitting: Emergency Medicine

## 2020-09-02 DIAGNOSIS — I10 Essential (primary) hypertension: Secondary | ICD-10-CM | POA: Insufficient documentation

## 2020-09-02 DIAGNOSIS — Z79899 Other long term (current) drug therapy: Secondary | ICD-10-CM | POA: Diagnosis not present

## 2020-09-02 DIAGNOSIS — E236 Other disorders of pituitary gland: Secondary | ICD-10-CM | POA: Diagnosis not present

## 2020-09-02 DIAGNOSIS — E119 Type 2 diabetes mellitus without complications: Secondary | ICD-10-CM | POA: Insufficient documentation

## 2020-09-02 DIAGNOSIS — R410 Disorientation, unspecified: Secondary | ICD-10-CM | POA: Diagnosis not present

## 2020-09-02 DIAGNOSIS — R443 Hallucinations, unspecified: Secondary | ICD-10-CM | POA: Insufficient documentation

## 2020-09-02 DIAGNOSIS — R059 Cough, unspecified: Secondary | ICD-10-CM | POA: Diagnosis not present

## 2020-09-02 DIAGNOSIS — Z9889 Other specified postprocedural states: Secondary | ICD-10-CM | POA: Diagnosis not present

## 2020-09-02 DIAGNOSIS — Z20822 Contact with and (suspected) exposure to covid-19: Secondary | ICD-10-CM | POA: Diagnosis not present

## 2020-09-02 LAB — COMPREHENSIVE METABOLIC PANEL
ALT: 20 U/L (ref 0–44)
AST: 25 U/L (ref 15–41)
Albumin: 3.3 g/dL — ABNORMAL LOW (ref 3.5–5.0)
Alkaline Phosphatase: 61 U/L (ref 38–126)
Anion gap: 8 (ref 5–15)
BUN: 30 mg/dL — ABNORMAL HIGH (ref 8–23)
CO2: 26 mmol/L (ref 22–32)
Calcium: 9.1 mg/dL (ref 8.9–10.3)
Chloride: 102 mmol/L (ref 98–111)
Creatinine, Ser: 1.24 mg/dL (ref 0.61–1.24)
GFR, Estimated: >60 mL/min (ref >60)
Glucose, Bld: 113 mg/dL — ABNORMAL HIGH (ref 70–99)
Potassium: 4.7 mmol/L (ref 3.5–5.1)
Sodium: 136 mmol/L (ref 135–145)
Total Bilirubin: 0.8 mg/dL (ref 0.3–1.2)
Total Protein: 6.8 g/dL (ref 6.5–8.1)

## 2020-09-02 LAB — CBC WITH DIFFERENTIAL/PLATELET
Abs Immature Granulocytes: 0.09 10*3/uL — ABNORMAL HIGH (ref 0.00–0.07)
Basophils Absolute: 0.1 10*3/uL (ref 0.0–0.1)
Basophils Relative: 1 %
Eosinophils Absolute: 0.2 10*3/uL (ref 0.0–0.5)
Eosinophils Relative: 3 %
HCT: 34.1 % — ABNORMAL LOW (ref 39.0–52.0)
Hemoglobin: 10.9 g/dL — ABNORMAL LOW (ref 13.0–17.0)
Immature Granulocytes: 1 %
Lymphocytes Relative: 21 %
Lymphs Abs: 1.6 10*3/uL (ref 0.7–4.0)
MCH: 30.8 pg (ref 26.0–34.0)
MCHC: 32 g/dL (ref 30.0–36.0)
MCV: 96.3 fL (ref 80.0–100.0)
Monocytes Absolute: 0.8 10*3/uL (ref 0.1–1.0)
Monocytes Relative: 11 %
Neutro Abs: 4.6 10*3/uL (ref 1.7–7.7)
Neutrophils Relative %: 63 %
Platelets: 249 10*3/uL (ref 150–400)
RBC: 3.54 MIL/uL — ABNORMAL LOW (ref 4.22–5.81)
RDW: 12.7 % (ref 11.5–15.5)
WBC: 7.3 10*3/uL (ref 4.0–10.5)
nRBC: 0 % (ref 0.0–0.2)

## 2020-09-02 LAB — LIPASE, BLOOD: Lipase: 27 U/L (ref 11–51)

## 2020-09-02 NOTE — ED Triage Notes (Signed)
Pt here from home with a fever and hallucinations according to wife , pt had back surgery on Monday , pt alert and oriented on arrival

## 2020-09-02 NOTE — ED Provider Notes (Signed)
Emergency Medicine Provider Triage Evaluation Note  Albert Benjamin , a 70 y.o. male  was evaluated in triage.  Pt complains of multiple complaints. Recently lumbar surgery with Dr. Jordan Likes last Monday. Over last 24 hours has had hallucinations. Thinking parts were falling off the car in front of them. Not eating or drinking. Persistent nausea. Low grade fever 100.0 PTA. Family states wheeze and is coughing up white sputum since surgery. Surgical site looks good, no drainage or warmth. Family stopped narcotic meds thinking that was causing his hallucinations. No abd pain. Does feel constipated, no BM since prior to surgery. Has chronic numbness to unilateral extremity since prior to surgery. No incontinence, saddle anesthesia.  Review of Systems  Positive: Cough, nausea, low grade temp, hallucinations Negative: HA, weakness, abd pain, diarrhea, LE edema, CP, SOB  Physical Exam  BP 123/78 (BP Location: Right Arm)   Pulse 79   Temp 98.9 F (37.2 C) (Oral)   Resp 18   SpO2 98%  Gen:   Awake, no distress   Resp:  Normal effort  MSK:   Moves extremities without difficulty. Compartments soft Skin:  Midline surgical site without drainage, bleeding, erythema, warmth.  Psych:  Alert to person, place, time Other:    Medical Decision Making  Medically screening exam initiated at 9:36 PM.  Appropriate orders placed.  Albert Benjamin was informed that the remainder of the evaluation will be completed by another provider, this initial triage assessment does not replace that evaluation, and the importance of remaining in the ED until their evaluation is complete.  Hallucinations, cough, nausea   Jaylnn Ullery A, PA-C 09/02/20 2142    Tegeler, Canary Brim, MD 09/03/20 [phone]

## 2020-09-03 ENCOUNTER — Emergency Department (HOSPITAL_COMMUNITY): Payer: PPO

## 2020-09-03 DIAGNOSIS — R443 Hallucinations, unspecified: Secondary | ICD-10-CM | POA: Diagnosis not present

## 2020-09-03 DIAGNOSIS — Z9889 Other specified postprocedural states: Secondary | ICD-10-CM | POA: Diagnosis not present

## 2020-09-03 DIAGNOSIS — R410 Disorientation, unspecified: Secondary | ICD-10-CM | POA: Diagnosis not present

## 2020-09-03 DIAGNOSIS — E236 Other disorders of pituitary gland: Secondary | ICD-10-CM | POA: Diagnosis not present

## 2020-09-03 DIAGNOSIS — I10 Essential (primary) hypertension: Secondary | ICD-10-CM | POA: Diagnosis not present

## 2020-09-03 LAB — URINALYSIS, ROUTINE W REFLEX MICROSCOPIC
Bilirubin Urine: NEGATIVE
Glucose, UA: NEGATIVE mg/dL
Hgb urine dipstick: NEGATIVE
Ketones, ur: NEGATIVE mg/dL
Leukocytes,Ua: NEGATIVE
Nitrite: NEGATIVE
Protein, ur: NEGATIVE mg/dL
Specific Gravity, Urine: 1.014 (ref 1.005–1.030)
pH: 6 (ref 5.0–8.0)

## 2020-09-03 LAB — RESPIRATORY PANEL BY PCR

## 2020-09-03 LAB — RESP PANEL BY RT-PCR (FLU A&B, COVID) ARPGX2
Influenza A by PCR: NEGATIVE
Influenza B by PCR: NEGATIVE
SARS Coronavirus 2 by RT PCR: NEGATIVE

## 2020-09-03 MED ORDER — SODIUM CHLORIDE 0.9 % IV BOLUS
500.0000 mL | Freq: Once | INTRAVENOUS | Status: AC
  Administered 2020-09-03: 500 mL via INTRAVENOUS

## 2020-09-03 NOTE — Discharge Instructions (Addendum)
Work-up today including labs, urine test, MRI are all normal. Symptoms may be related to medications.  I would stay off the narcotic pain medication and wean down/off on the muscle relaxants.  Can safely take up to 600-800mg  3x daily and up to 2000mg  tylenol daily. Follow-up with Dr. this week as scheduled. Return here for new/acute changes.

## 2020-09-03 NOTE — ED Notes (Signed)
E-signature pad unavailable at time of pt discharge. This RN discussed discharge materials with pt and answered all pt questions. Pt stated understanding of discharge material. ? ?

## 2020-09-03 NOTE — ED Provider Notes (Signed)
[facility] Provider Note   CSN: [id number] Arrival date & time: 09/02/20  2123     History Chief Complaint  Patient presents with   Hallucinations     Albert ItoHoward E Benjamin is a 70 y.o. male.  The history is provided by the patient and medical records.   70 year old male with history of seasonal allergies, depression, diabetes, hypertension, OSA on CPAP, presenting to the ED with hallucinations.  Patient underwent multilevel lumbar laminectomy with Dr. Dutch QuintPoole 08/28/2020.  Wife reports this went as well as expected he was discharged home the next day.  3 days ago he began seeming a little "off".  She reports some hallucinations, felt that pieces were falling out of car in front of them when in fact they were not.  States his speech has been a little off, mumbling more than normal and has been having to repeat himself for family to understand.  He has not had any falls or head trauma.  Wife has been tending to his dressing changes, states incision without redness, swelling, drainage.  He did have low grade fever today of 100F along with a lot of increased congestion and mild cough.  She did speak with on call physician about this, thought maybe he was dehydrated or could have developed pneumonia secondary to intubation during surgery.  Wife also has weaned him off of his narcotic pain medication in case this was contributing.  He is still taking his Flexeril, last dose was yesterday morning around 9 AM.  No acute changes in LE sensation/movement.  No bowel or bladder incontinence.    Past Medical History:  Diagnosis Date   Allergy    Aphakia, left eye 02/03/2020   Secondary insertion of posterior chamber intraocular lens via Yamani scleral tunnel technique 02/02/2020 left eye   Arthritis    Cataract    bilkateral surgery 3-40 years ago   Depression    Diabetes mellitus    Hypertension    Obstructive sleep apnea on CPAP    Pneumonia    Sleep apnea    does  not use CPAP   Vitreous prolapse, left eye 01/11/2020   Wears glasses    Wears hearing aid    both ears    Patient Active Problem List   Diagnosis Date Noted   Lumbar stenosis with neurogenic claudication 08/28/2020   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, moderate stage 08/17/2020   Chronic narrow angle glaucoma, left, moderate stage 08/17/2020   Cystoid macular edema of left eye 02/29/2020   Postoperative follow-up 02/03/2020   Dislocated IOL (intraocular lens), posterior, left 01/11/2020   Diabetes (HCC) 11/24/2019    Past Surgical History:  Procedure Laterality Date   ANTERIOR CERVICAL DECOMP/DISCECTOMY FUSION N/A 03/11/2012   Procedure: ANTERIOR CERVICAL DECOMPRESSION/DISCECTOMY FUSION 3 LEVELS;  Surgeon: Karn CassisErnesto M Botero, MD;  Location: [facility];  Service: Neurosurgery;  Laterality: N/A;  Anterior cervical decompression/fusion Cervical four-five, Cervical five-six, Cervical six-seven   COLONOSCOPY     EYE SURGERY     catarct bil   KNEE ARTHROSCOPY  2007   left, right 09/2011   LUMBAR LAMINECTOMY/DECOMPRESSION MICRODISCECTOMY N/A 08/28/2020   Procedure: Laminectomy and Foraminotomy - Lumbar Two-Lumbar Three, Lumbar Three-Lumbar Four, Lumbar Four-Lumbar Five,  Lumbar Five-Sacral One;  Surgeon: Julio SicksPool, Henry, MD;  Location: [facility] OR;  Service: Neurosurgery;  Laterality: N/A;   SHOULDER ARTHROSCOPY  2005,2010-   rightx2-dsc       Family History  Problem Relation Age  of Onset   Diabetes Mother    Colon cancer Neg Hx    Heart disease Neg Hx    Esophageal cancer Neg Hx    Pancreatic cancer Neg Hx    Stomach cancer Neg Hx    Rectal cancer Neg Hx     Social History   Tobacco Use   Smoking status: Never   Smokeless tobacco: Never  Vaping Use   Vaping Use: Never used  Substance Use Topics   Alcohol use: No   Drug use: No    Home Medications Prior to Admission medications   Medication Sig Start Date End Date Taking? Authorizing Provider   cetirizine (ZYRTEC) 10 MG tablet Take 10 mg by mouth daily.    [provider]  citalopram (CELEXA) 40 MG tablet Take 40 mg by mouth every evening.    [provider]  cyclobenzaprine (FLEXERIL) 10 MG tablet Take 1 tablet (10 mg total) by mouth 3 (three) times daily as needed for muscle spasms. 08/29/20   Val Eagle D, NP  HYDROcodone-acetaminophen (NORCO) 10-325 MG tablet Take 1 tablet by mouth every 4 (four) hours as needed for severe pain ((score 7 to 10)). 08/29/20   Val Eagle D, NP  ketorolac (ACULAR) 0.5 % ophthalmic solution Place 1 drop into the left eye 4 (four) times daily. 07/06/20 07/06/21  Rankin, Alford Highland, MD  lisinopril (ZESTRIL) 2.5 MG tablet Take 2.5 mg by mouth daily.    [provider]  montelukast (SINGULAIR) 10 MG tablet Take 10 mg by mouth at bedtime.    [provider]  pioglitazone (ACTOS) 15 MG tablet Take 15 mg by mouth daily. 11/08/19   [provider]  pravastatin (PRAVACHOL) 40 MG tablet Take 40 mg by mouth every evening.    [provider]    Allergies    Patient has no known allergies.  Review of Systems   Review of Systems  Psychiatric/Behavioral:  Positive for hallucinations.   All other systems reviewed and are negative.  Physical Exam Updated Vital Signs BP 126/74   Pulse 77   Temp 98.9 F (37.2 C) (Oral)   Resp 17   SpO2 100%   Physical Exam Vitals and nursing note reviewed.  Constitutional:      Appearance: He is well-developed.  HENT:     Head: Normocephalic and atraumatic.  Eyes:     Conjunctiva/sclera: Conjunctivae normal.     Pupils: Pupils are equal, round, and reactive to light.  Cardiovascular:     Rate and Rhythm: Normal rate and regular rhythm.     Heart sounds: Normal heart sounds.  Pulmonary:     Effort: Pulmonary effort is normal.     Breath sounds: Normal breath sounds.  Abdominal:     General: Bowel sounds are normal.     Palpations: Abdomen is soft.   Musculoskeletal:        General: Normal range of motion.     Cervical back: Normal range of motion.     Comments: Midline lumbar incision appears clean, dry, intact with steri strips in place; there is some minimal surrounding bruising without erythema, induration, tissue crepitus, or drainage  Skin:    General: Skin is warm and dry.  Neurological:     Mental Status: He is alert.     Comments: Awake, alert, oriented to self and surroundings, he is able to answer most questions appropriately, occasionally makes statements that are off topic, mumbling a bit but no appreciable aphasia, extremity strength/movement  at baseline    ED Results / Procedures / Treatments   Labs (all labs ordered are listed, but only abnormal results are displayed) Labs Reviewed  CBC WITH DIFFERENTIAL/PLATELET - Abnormal; Notable for the following components:      Result Value   RBC 3.54 (*)    Hemoglobin 10.9 (*)    HCT 34.1 (*)    Abs Immature Granulocytes 0.09 (*)    All other components within normal limits  COMPREHENSIVE METABOLIC PANEL - Abnormal; Notable for the following components:   Glucose, Bld 113 (*)    BUN 30 (*)    Albumin 3.3 (*)    All other components within normal limits  LIPASE, BLOOD  URINALYSIS, ROUTINE W REFLEX MICROSCOPIC    EKG EKG Interpretation  Date/Time:  Saturday September 02 2020 21:41:48 EDT Ventricular Rate:  79 PR Interval:  168 QRS Duration: 82 QT Interval:  354 QTC Calculation: 405 R Axis:   -2 Text Interpretation: Normal sinus rhythm Inferior infarct , age undetermined Abnormal ECG Confirmed by Gilda Crease [phone]) on 09/03/2020 1:15:44 AM  Radiology DG Chest 2 View  Result Date: 09/02/2020 CLINICAL DATA:  Cough.  Recent surgery. EXAM: CHEST - 2 VIEW COMPARISON:  Chest x-ray 05/22/2020 FINDINGS: The heart and mediastinal contours are unchanged. No focal consolidation. No pulmonary edema. No pleural effusion. No pneumothorax. No acute osseous abnormality.  IMPRESSION: No active cardiopulmonary disease. Electronically Signed   By: Tish Frederickson M.D.   On: 09/02/2020 22:19   CT HEAD WO CONTRAST ( )  Result Date: 09/02/2020 CLINICAL DATA:  Delirium. EXAM: CT HEAD WITHOUT CONTRAST TECHNIQUE: Contiguous axial images were obtained from the base of the skull through the vertex without intravenous contrast. COMPARISON:  None. BRAIN: BRAIN Cerebral ventricle sizes are concordant with the degree of cerebral volume loss. No evidence of large-territorial acute infarction. No parenchymal hemorrhage. No mass lesion. No extra-axial collection. No mass effect or midline shift. No hydrocephalus. Basilar cisterns are patent. Vascular: No hyperdense vessel. Skull: No acute fracture or focal lesion. Sinuses/Orbits: Paranasal sinuses and mastoid air cells are clear. Bilateral lens replacement. Otherwise the orbits are unremarkable. Other: None. IMPRESSION: No acute intracranial abnormality. Electronically Signed   By: Tish Frederickson M.D.   On: 09/02/2020 22:01   MR BRAIN WO CONTRAST  Result Date: 09/03/2020 CLINICAL DATA:  70 year old male with delirium. Recent lumbar surgery. EXAM: MRI HEAD WITHOUT CONTRAST TECHNIQUE: Multiplanar, multiecho pulse sequences of the brain and surrounding structures were obtained without intravenous contrast. COMPARISON:  Head CT 09/02/2020. FINDINGS: Brain: No restricted diffusion to suggest acute infarction. No midline shift, mass effect, evidence of mass lesion, ventriculomegaly, extra-axial collection or acute intracranial hemorrhage. Cervicomedullary junction is within normal limits. Subcentimeter T2 hyperintense and simple appearing cyst of the right pituitary gland (series 6, image 8 and series 10, image 20). Overall gland size remains normal. No regional mass effect or invasion. Largely normal for age gray and white matter signal throughout the brain. Minimal to mild nonspecific cerebral white matter T2 and FLAIR hyperintensity. No  cortical encephalomalacia or chronic cerebral blood products. Deep gray matter nuclei, brainstem and cerebellum appear negative. Vascular: Major intracranial vascular flow voids are preserved. Skull and upper cervical spine: Negative visible cervical spine. Normal bone marrow signal. Sinuses/Orbits: Postoperative changes to both globes, otherwise negative orbits. Paranasal sinuses and mastoids are stable and well aerated. Other: Grossly normal visible internal auditory structures. Negative visible scalp and face soft tissues. IMPRESSION: 1. No acute intracranial abnormality. 2. Largely normal for  age noncontrast MRI appearance of the brain; - minimal nonspecific cerebral white matter changes. - subcentimeter pituitary cyst with no regional mass effect or invasion. No further imaging evaluation is necessary. This follows ACR consensus guidelines: Management of Incidental Pituitary Findings on CT, MRI and F18-FDG PET: A White Paper of the ACR Incidental Findings Committee. J Am Coll Radiol 2018; 15: 606-30. Electronically Signed   By: Odessa Fleming M.D.   On: 09/03/2020 04:03    Procedures Procedures   Medications Ordered in ED Medications - No data to display  ED Course  I have reviewed the triage vital signs and the nursing notes.  Pertinent labs & imaging results that were available during my care of the patient were reviewed by me and considered in my medical decision making (see chart for details).    MDM Rules/Calculators/A&P                           70 year old male presenting to the ED with hallucinations.  Underwent multilevel lumbar laminectomy 08/28/2020 with Dr. Dutch Quint, procedure went well, discharged home the next day.  Wife reports recently has been experiencing some hallucinations, speech is seemed a bit mumbled and his overall behavior is "off".  He has been taking narcotics which she weaned him off yesterday, however he is still taking muscle relaxers (flexeril).  Has had a little bit of  cough and congestion as well.  She contacted the on-call physician who encouraged evaluation for possible dehydration, pneumonia, or etc.  Here patient is awake, alert, oriented to self and situation.  He does seem to be mumbling a bit and occasionally makes statements that are somewhat off but for the most part is able to answer questions and follow commands appropriately.  His neurologic exam is at baseline.  He has not had any new bowel or bladder incontinence.  Surgical incision appears clean, dry, intact without any surrounding signs of cellulitis or infection.  There is no purulent drainage.  Work-up initiated in triage including screening labs, chest x-ray, and CT head which are all reassuring.  He does have some signs of mild dehydration so we will give IV fluid bolus.  As he is having some congestion, consider viral process.  RVP and COVID screen have been sent.  We will also check urinalysis.  Does have potential for possible intraoperative stroke, so will obtain MRI of brain.  UA without signs of infection.  Covid screen and RVP negative.  MRI without findings of acute stroke.  Question if this is medication induced.  He has been weaned off narcotics but is still taking flexeril which can certainly cause some delirium.  He remains afebrile, non-toxic, and HD stable.    Discussed with wife options of admission for observation vs discharge home, she feels comfortable with discharge home given reassuring work-up.  This seems reasonable.  They do have follow-up with neurosurgery this week.  Would recommend to avoid narcotics and muscle relaxants for now as this is likely contributing.  Can return here for any new/acute changes.  Shared visit with attending physician, Dr. Blinda Leatherwood, who evaluated patient and agrees with plan of care.  Final Clinical Impression(s) / ED Diagnoses Final diagnoses:  Hallucinations    Rx / DC Orders ED Discharge Orders     None        Garlon Hatchet,  PA-C 09/03/20 0544    Gilda Crease, MD 09/03/20 [phone]

## 2020-09-03 NOTE — ED Notes (Signed)
Patient transported to MRI 

## 2020-09-06 DIAGNOSIS — I10 Essential (primary) hypertension: Secondary | ICD-10-CM | POA: Diagnosis not present

## 2020-09-06 DIAGNOSIS — E119 Type 2 diabetes mellitus without complications: Secondary | ICD-10-CM | POA: Diagnosis not present

## 2020-09-14 ENCOUNTER — Other Ambulatory Visit: Payer: Self-pay

## 2020-09-14 ENCOUNTER — Encounter (INDEPENDENT_AMBULATORY_CARE_PROVIDER_SITE_OTHER): Payer: Self-pay | Admitting: Ophthalmology

## 2020-09-14 ENCOUNTER — Ambulatory Visit (INDEPENDENT_AMBULATORY_CARE_PROVIDER_SITE_OTHER): Payer: PPO | Admitting: Ophthalmology

## 2020-09-14 DIAGNOSIS — H35352 Cystoid macular degeneration, left eye: Secondary | ICD-10-CM | POA: Diagnosis not present

## 2020-09-14 DIAGNOSIS — T8522XD Displacement of intraocular lens, subsequent encounter: Secondary | ICD-10-CM

## 2020-09-14 DIAGNOSIS — T8522XA Displacement of intraocular lens, initial encounter: Secondary | ICD-10-CM

## 2020-09-14 NOTE — Assessment & Plan Note (Signed)
Looks great OS with excellent acuity

## 2020-09-14 NOTE — Assessment & Plan Note (Signed)
Chronic active could be recurrent due to the placement of Yamane scleral tunnel suspended intraocular lens.  Will control with topical ketorolac indefinitely

## 2020-09-14 NOTE — Patient Instructions (Signed)
Patient instructed to continue on chronic use of topical NSAIDs, ketorolac 1 drop left eye 3-4 times daily long-term to prevent recurrence of CME OS

## 2020-09-14 NOTE — Progress Notes (Signed)
09/14/2020     CHIEF COMPLAINT Patient presents for  Chief Complaint  Patient presents with   Retina Follow Up      HISTORY OF PRESENT ILLNESS: Albert Benjamin is a 70 y.o. male who presents to the clinic today for:   HPI     Retina Follow Up   Patient presents with  Other.  In left eye.  This started 4 weeks ago.  Duration of 4 weeks.        Comments   4 week f/u OS with OCT, no dilation  Pt states the vision remains somewhat blurry in the left eye, he has started wearing a new set of lenses since his previous visit, not much improvement. Pt denies any new flashes or floaters. Pt denies any eye pain.  Eye Meds: Ketorolac TID OS      Last edited by Frederik Pear, COA on 09/14/2020  9:21 AM.      Referring physician: Crist Fat, MD [address] [place],  [place] [address]  HISTORICAL INFORMATION:   Selected notes from the MEDICAL RECORD NUMBER       CURRENT MEDICATIONS: Current Outpatient Medications (Ophthalmic Drugs)  Medication Sig   ketorolac (ACULAR) 0.5 % ophthalmic solution Place 1 drop into the left eye 4 (four) times daily.   No current facility-administered medications for this visit. (Ophthalmic Drugs)   Current Outpatient Medications (Other)  Medication Sig   cetirizine (ZYRTEC) 10 MG tablet Take 10 mg by mouth daily.   citalopram (CELEXA) 40 MG tablet Take 40 mg by mouth every evening.   cyclobenzaprine (FLEXERIL) 10 MG tablet Take 1 tablet (10 mg total) by mouth 3 (three) times daily as needed for muscle spasms.   HYDROcodone-acetaminophen (NORCO) 10-325 MG tablet Take 1 tablet by mouth every 4 (four) hours as needed for severe pain ((score 7 to 10)).   lisinopril (ZESTRIL) 2.5 MG tablet Take 2.5 mg by mouth daily.   montelukast (SINGULAIR) 10 MG tablet Take 10 mg by mouth at bedtime.   pioglitazone (ACTOS) 15 MG tablet Take 15 mg by mouth daily.   pravastatin (PRAVACHOL) 40 MG tablet Take 40 mg by mouth every evening.   No current  facility-administered medications for this visit. (Other)      REVIEW OF SYSTEMS:    ALLERGIES Allergies  Allergen Reactions   Flexeril [Cyclobenzaprine] Other (See Comments)    Hallucinations-reported per patient's account    PAST MEDICAL HISTORY Past Medical History:  Diagnosis Date   Allergy    Aphakia, left eye 02/03/2020   Secondary insertion of posterior chamber intraocular lens via Yamani scleral tunnel technique 02/02/2020 left eye   Arthritis    Cataract    bilkateral surgery 3-40 years ago   Depression    Diabetes mellitus    Hypertension    Obstructive sleep apnea on CPAP    Pneumonia    Sleep apnea    does not use CPAP   Vitreous prolapse, left eye 01/11/2020   Wears glasses    Wears hearing aid    both ears   Past Surgical History:  Procedure Laterality Date   ANTERIOR CERVICAL DECOMP/DISCECTOMY FUSION N/A 03/11/2012   Procedure: ANTERIOR CERVICAL DECOMPRESSION/DISCECTOMY FUSION 3 LEVELS;  Surgeon: Karn Cassis, MD;  Location: [facility];  Service: Neurosurgery;  Laterality: N/A;  Anterior cervical decompression/fusion Cervical four-five, Cervical five-six, Cervical six-seven   COLONOSCOPY     EYE SURGERY     catarct bil  KNEE ARTHROSCOPY  2007   left, right 09/2011   LUMBAR LAMINECTOMY/DECOMPRESSION MICRODISCECTOMY N/A 08/28/2020   Procedure: Laminectomy and Foraminotomy - Lumbar Two-Lumbar Three, Lumbar Three-Lumbar Four, Lumbar Four-Lumbar Five,  Lumbar Five-Sacral One;  Surgeon: Julio Sicks, MD;  Location: [facility] OR;  Service: Neurosurgery;  Laterality: N/A;   SHOULDER ARTHROSCOPY  2005,2010-   rightx2-dsc    FAMILY HISTORY Family History  Problem Relation Age of Onset   Diabetes Mother    Colon cancer Neg Hx    Heart disease Neg Hx    Esophageal cancer Neg Hx    Pancreatic cancer Neg Hx    Stomach cancer Neg Hx    Rectal cancer Neg Hx     SOCIAL HISTORY Social History   Tobacco Use   Smoking status: Never   Smokeless tobacco: Never   Vaping Use   Vaping Use: Never used  Substance Use Topics   Alcohol use: No   Drug use: No         OPHTHALMIC EXAM:  Base Eye Exam     Visual Acuity (ETDRS)       Right Left   Dist cc 20/20 20/30 +2   Dist ph cc  NI    Correction: Glasses         Tonometry (Tonopen, 9:28 AM)       Right Left   Pressure 10 11         Pupils       Pupils Dark Light Shape React APD   Right PERRL 5 4 Round Brisk None   Left PERRL 5 4 Round Brisk None         Visual Fields (Counting fingers)       Left Right    Full Full         Extraocular Movement       Right Left    Full, Ortho Full, Ortho         Neuro/Psych     Oriented x3: Yes   Mood/Affect: Normal         Dilation     NO DILATION            Slit Lamp and Fundus Exam     External Exam       Right Left   External Normal Normal         Slit Lamp Exam       Right Left   Lids/Lashes Normal Normal   Conjunctiva/Sclera White and quiet White and quiet, no exposed haptic tags   Cornea Clear Clear,   Anterior Chamber Deep and quiet Deep and quiet, no pigment   Iris Round and reactive Round and reactive, PI inferior 630,  with iris transillumination defects nearly 360   Lens Centered posterior chamber intraocular lens, 3 piece Well centered Zeiss PCIOL, CT Lucia   Anterior Vitreous Normal Clear avitric, not dilated         Fundus Exam       Right Left   Posterior Vitreous  Clear, avitric            IMAGING AND PROCEDURES  Imaging and Procedures for 09/14/20  OCT, Retina - OU - Both Eyes       Right Eye Quality was good. Scan locations included subfoveal. Central Foveal Thickness: 313. Progression has been stable. Findings include normal foveal contour.   Left Eye Quality was good. Scan locations included subfoveal. Central Foveal Thickness: 365. Progression has improved. Findings include cystoid macular edema.  Notes OS, vastly improved CME on topical NSAIDs.  Yet  still chronically active temporally,               ASSESSMENT/PLAN:  Dislocated IOL (intraocular lens), posterior, left Looks great OS with excellent acuity  Cystoid macular edema of left eye Chronic active could be recurrent due to the placement of Yamane scleral tunnel suspended intraocular lens.  Will control with topical ketorolac indefinitely     ICD-10-CM   1. Cystoid macular edema of left eye  H35.352 OCT, Retina - OU - Both Eyes    2. Dislocated IOL (intraocular lens), posterior, left  T85.22XA       1.  2.  3.  Ophthalmic Meds Ordered this visit:  No orders of the defined types were placed in this encounter.      Return in about 5 months (around 02/14/2021) for DILATE OU, OCT.  Patient Instructions  Patient instructed to continue on chronic use of topical NSAIDs, ketorolac 1 drop left eye 3-4 times daily long-term to prevent recurrence of CME OS   Explained the diagnoses, plan, and follow up with the patient and they expressed understanding.  Patient expressed understanding of the importance of proper follow up care.   Alford Highland Hoyte Ziebell M.D. Diseases & Surgery of the Retina and Vitreous [facility] 09/14/20     Abbreviations: M myopia (nearsighted); A astigmatism; H hyperopia (farsighted); P presbyopia; Mrx spectacle prescription;  CTL contact lenses; OD right eye; OS left eye; OU both eyes  XT exotropia; ET esotropia; PEK punctate epithelial keratitis; PEE punctate epithelial erosions; DES dry eye syndrome; MGD meibomian gland dysfunction; ATs artificial tears; PFAT's preservative free artificial tears; NSC nuclear sclerotic cataract; PSC posterior subcapsular cataract; ERM epi-retinal membrane; PVD posterior vitreous detachment; RD retinal detachment; DM diabetes mellitus; DR diabetic retinopathy; NPDR non-proliferative diabetic retinopathy; PDR proliferative diabetic retinopathy; CSME clinically significant macular edema; DME diabetic  macular edema; dbh dot blot hemorrhages; CWS cotton wool spot; POAG primary open angle glaucoma; C/D cup-to-disc ratio; HVF humphrey visual field; GVF goldmann visual field; OCT optical coherence tomography; IOP intraocular pressure; BRVO Branch retinal vein occlusion; CRVO central retinal vein occlusion; CRAO central retinal artery occlusion; BRAO branch retinal artery occlusion; RT retinal tear; SB scleral buckle; PPV pars plana vitrectomy; VH Vitreous hemorrhage; PRP panretinal laser photocoagulation; IVK intravitreal kenalog; VMT vitreomacular traction; MH Macular hole;  NVD neovascularization of the disc; NVE neovascularization elsewhere; AREDS age related eye disease study; ARMD age related macular degeneration; POAG primary open angle glaucoma; EBMD epithelial/anterior basement membrane dystrophy; ACIOL anterior chamber intraocular lens; IOL intraocular lens; PCIOL posterior chamber intraocular lens; Phaco/IOL phacoemulsification with intraocular lens placement; PRK photorefractive keratectomy; LASIK laser assisted in situ keratomileusis; HTN hypertension; DM diabetes mellitus; COPD chronic obstructive pulmonary disease

## 2020-09-23 DIAGNOSIS — J0100 Acute maxillary sinusitis, unspecified: Secondary | ICD-10-CM | POA: Diagnosis not present

## 2020-10-02 DIAGNOSIS — M2560 Stiffness of unspecified joint, not elsewhere classified: Secondary | ICD-10-CM | POA: Diagnosis not present

## 2020-10-02 DIAGNOSIS — M25559 Pain in unspecified hip: Secondary | ICD-10-CM | POA: Diagnosis not present

## 2020-10-02 DIAGNOSIS — M25659 Stiffness of unspecified hip, not elsewhere classified: Secondary | ICD-10-CM | POA: Diagnosis not present

## 2020-10-02 DIAGNOSIS — M545 Low back pain, unspecified: Secondary | ICD-10-CM | POA: Diagnosis not present

## 2020-10-05 DIAGNOSIS — M2560 Stiffness of unspecified joint, not elsewhere classified: Secondary | ICD-10-CM | POA: Diagnosis not present

## 2020-10-05 DIAGNOSIS — M545 Low back pain, unspecified: Secondary | ICD-10-CM | POA: Diagnosis not present

## 2020-10-05 DIAGNOSIS — M25659 Stiffness of unspecified hip, not elsewhere classified: Secondary | ICD-10-CM | POA: Diagnosis not present

## 2020-10-05 DIAGNOSIS — M25559 Pain in unspecified hip: Secondary | ICD-10-CM | POA: Diagnosis not present

## 2020-10-09 DIAGNOSIS — Z23 Encounter for immunization: Secondary | ICD-10-CM | POA: Diagnosis not present

## 2020-10-09 DIAGNOSIS — E1165 Type 2 diabetes mellitus with hyperglycemia: Secondary | ICD-10-CM | POA: Diagnosis not present

## 2020-10-10 DIAGNOSIS — M545 Low back pain, unspecified: Secondary | ICD-10-CM | POA: Diagnosis not present

## 2020-10-10 DIAGNOSIS — M25659 Stiffness of unspecified hip, not elsewhere classified: Secondary | ICD-10-CM | POA: Diagnosis not present

## 2020-10-10 DIAGNOSIS — M2560 Stiffness of unspecified joint, not elsewhere classified: Secondary | ICD-10-CM | POA: Diagnosis not present

## 2020-10-10 DIAGNOSIS — M25559 Pain in unspecified hip: Secondary | ICD-10-CM | POA: Diagnosis not present

## 2020-10-12 DIAGNOSIS — M25559 Pain in unspecified hip: Secondary | ICD-10-CM | POA: Diagnosis not present

## 2020-10-12 DIAGNOSIS — M25659 Stiffness of unspecified hip, not elsewhere classified: Secondary | ICD-10-CM | POA: Diagnosis not present

## 2020-10-12 DIAGNOSIS — M545 Low back pain, unspecified: Secondary | ICD-10-CM | POA: Diagnosis not present

## 2020-10-12 DIAGNOSIS — M2560 Stiffness of unspecified joint, not elsewhere classified: Secondary | ICD-10-CM | POA: Diagnosis not present

## 2020-10-16 DIAGNOSIS — M545 Low back pain, unspecified: Secondary | ICD-10-CM | POA: Diagnosis not present

## 2020-10-16 DIAGNOSIS — M25659 Stiffness of unspecified hip, not elsewhere classified: Secondary | ICD-10-CM | POA: Diagnosis not present

## 2020-10-16 DIAGNOSIS — M2560 Stiffness of unspecified joint, not elsewhere classified: Secondary | ICD-10-CM | POA: Diagnosis not present

## 2020-10-16 DIAGNOSIS — M25559 Pain in unspecified hip: Secondary | ICD-10-CM | POA: Diagnosis not present

## 2020-10-19 DIAGNOSIS — M2560 Stiffness of unspecified joint, not elsewhere classified: Secondary | ICD-10-CM | POA: Diagnosis not present

## 2020-10-19 DIAGNOSIS — M545 Low back pain, unspecified: Secondary | ICD-10-CM | POA: Diagnosis not present

## 2020-10-19 DIAGNOSIS — M25559 Pain in unspecified hip: Secondary | ICD-10-CM | POA: Diagnosis not present

## 2020-10-19 DIAGNOSIS — M25659 Stiffness of unspecified hip, not elsewhere classified: Secondary | ICD-10-CM | POA: Diagnosis not present

## 2020-10-24 DIAGNOSIS — M2560 Stiffness of unspecified joint, not elsewhere classified: Secondary | ICD-10-CM | POA: Diagnosis not present

## 2020-10-24 DIAGNOSIS — M25659 Stiffness of unspecified hip, not elsewhere classified: Secondary | ICD-10-CM | POA: Diagnosis not present

## 2020-10-24 DIAGNOSIS — M545 Low back pain, unspecified: Secondary | ICD-10-CM | POA: Diagnosis not present

## 2020-10-24 DIAGNOSIS — M25559 Pain in unspecified hip: Secondary | ICD-10-CM | POA: Diagnosis not present

## 2020-10-26 DIAGNOSIS — M25659 Stiffness of unspecified hip, not elsewhere classified: Secondary | ICD-10-CM | POA: Diagnosis not present

## 2020-10-26 DIAGNOSIS — M2560 Stiffness of unspecified joint, not elsewhere classified: Secondary | ICD-10-CM | POA: Diagnosis not present

## 2020-10-26 DIAGNOSIS — M545 Low back pain, unspecified: Secondary | ICD-10-CM | POA: Diagnosis not present

## 2020-10-26 DIAGNOSIS — M25559 Pain in unspecified hip: Secondary | ICD-10-CM | POA: Diagnosis not present

## 2020-10-31 DIAGNOSIS — M25659 Stiffness of unspecified hip, not elsewhere classified: Secondary | ICD-10-CM | POA: Diagnosis not present

## 2020-10-31 DIAGNOSIS — M25559 Pain in unspecified hip: Secondary | ICD-10-CM | POA: Diagnosis not present

## 2020-10-31 DIAGNOSIS — M545 Low back pain, unspecified: Secondary | ICD-10-CM | POA: Diagnosis not present

## 2020-10-31 DIAGNOSIS — M2560 Stiffness of unspecified joint, not elsewhere classified: Secondary | ICD-10-CM | POA: Diagnosis not present

## 2020-11-07 DIAGNOSIS — M2560 Stiffness of unspecified joint, not elsewhere classified: Secondary | ICD-10-CM | POA: Diagnosis not present

## 2020-11-07 DIAGNOSIS — M25559 Pain in unspecified hip: Secondary | ICD-10-CM | POA: Diagnosis not present

## 2020-11-07 DIAGNOSIS — M25659 Stiffness of unspecified hip, not elsewhere classified: Secondary | ICD-10-CM | POA: Diagnosis not present

## 2020-11-07 DIAGNOSIS — M545 Low back pain, unspecified: Secondary | ICD-10-CM | POA: Diagnosis not present

## 2020-11-21 DIAGNOSIS — M25559 Pain in unspecified hip: Secondary | ICD-10-CM | POA: Diagnosis not present

## 2020-11-21 DIAGNOSIS — M2560 Stiffness of unspecified joint, not elsewhere classified: Secondary | ICD-10-CM | POA: Diagnosis not present

## 2020-11-21 DIAGNOSIS — M25659 Stiffness of unspecified hip, not elsewhere classified: Secondary | ICD-10-CM | POA: Diagnosis not present

## 2020-11-21 DIAGNOSIS — M545 Low back pain, unspecified: Secondary | ICD-10-CM | POA: Diagnosis not present

## 2020-12-05 DIAGNOSIS — R059 Cough, unspecified: Secondary | ICD-10-CM | POA: Diagnosis not present

## 2020-12-05 DIAGNOSIS — M545 Low back pain, unspecified: Secondary | ICD-10-CM | POA: Diagnosis not present

## 2020-12-05 DIAGNOSIS — M25659 Stiffness of unspecified hip, not elsewhere classified: Secondary | ICD-10-CM | POA: Diagnosis not present

## 2020-12-05 DIAGNOSIS — M25559 Pain in unspecified hip: Secondary | ICD-10-CM | POA: Diagnosis not present

## 2020-12-05 DIAGNOSIS — J069 Acute upper respiratory infection, unspecified: Secondary | ICD-10-CM | POA: Diagnosis not present

## 2020-12-05 DIAGNOSIS — M2560 Stiffness of unspecified joint, not elsewhere classified: Secondary | ICD-10-CM | POA: Diagnosis not present

## 2020-12-06 DIAGNOSIS — Z6836 Body mass index (BMI) 36.0-36.9, adult: Secondary | ICD-10-CM | POA: Diagnosis not present

## 2020-12-06 DIAGNOSIS — I10 Essential (primary) hypertension: Secondary | ICD-10-CM | POA: Diagnosis not present

## 2020-12-06 DIAGNOSIS — M48062 Spinal stenosis, lumbar region with neurogenic claudication: Secondary | ICD-10-CM | POA: Diagnosis not present

## 2020-12-12 DIAGNOSIS — M25559 Pain in unspecified hip: Secondary | ICD-10-CM | POA: Diagnosis not present

## 2020-12-12 DIAGNOSIS — M545 Low back pain, unspecified: Secondary | ICD-10-CM | POA: Diagnosis not present

## 2020-12-12 DIAGNOSIS — M25659 Stiffness of unspecified hip, not elsewhere classified: Secondary | ICD-10-CM | POA: Diagnosis not present

## 2020-12-12 DIAGNOSIS — M2560 Stiffness of unspecified joint, not elsewhere classified: Secondary | ICD-10-CM | POA: Diagnosis not present

## 2020-12-15 DIAGNOSIS — M25559 Pain in unspecified hip: Secondary | ICD-10-CM | POA: Diagnosis not present

## 2020-12-15 DIAGNOSIS — M25659 Stiffness of unspecified hip, not elsewhere classified: Secondary | ICD-10-CM | POA: Diagnosis not present

## 2020-12-15 DIAGNOSIS — M545 Low back pain, unspecified: Secondary | ICD-10-CM | POA: Diagnosis not present

## 2020-12-15 DIAGNOSIS — M2560 Stiffness of unspecified joint, not elsewhere classified: Secondary | ICD-10-CM | POA: Diagnosis not present

## 2020-12-19 DIAGNOSIS — M25659 Stiffness of unspecified hip, not elsewhere classified: Secondary | ICD-10-CM | POA: Diagnosis not present

## 2020-12-19 DIAGNOSIS — M25559 Pain in unspecified hip: Secondary | ICD-10-CM | POA: Diagnosis not present

## 2020-12-19 DIAGNOSIS — M545 Low back pain, unspecified: Secondary | ICD-10-CM | POA: Diagnosis not present

## 2020-12-19 DIAGNOSIS — M2560 Stiffness of unspecified joint, not elsewhere classified: Secondary | ICD-10-CM | POA: Diagnosis not present

## 2020-12-21 DIAGNOSIS — M2560 Stiffness of unspecified joint, not elsewhere classified: Secondary | ICD-10-CM | POA: Diagnosis not present

## 2020-12-21 DIAGNOSIS — M25659 Stiffness of unspecified hip, not elsewhere classified: Secondary | ICD-10-CM | POA: Diagnosis not present

## 2020-12-21 DIAGNOSIS — M545 Low back pain, unspecified: Secondary | ICD-10-CM | POA: Diagnosis not present

## 2020-12-21 DIAGNOSIS — M25559 Pain in unspecified hip: Secondary | ICD-10-CM | POA: Diagnosis not present

## 2021-01-10 DIAGNOSIS — Z6841 Body Mass Index (BMI) 40.0 and over, adult: Secondary | ICD-10-CM | POA: Diagnosis not present

## 2021-01-10 DIAGNOSIS — E119 Type 2 diabetes mellitus without complications: Secondary | ICD-10-CM | POA: Diagnosis not present

## 2021-02-08 DIAGNOSIS — Z9889 Other specified postprocedural states: Secondary | ICD-10-CM | POA: Diagnosis not present

## 2021-02-08 DIAGNOSIS — I10 Essential (primary) hypertension: Secondary | ICD-10-CM | POA: Diagnosis not present

## 2021-02-08 DIAGNOSIS — Z6836 Body mass index (BMI) 36.0-36.9, adult: Secondary | ICD-10-CM | POA: Diagnosis not present

## 2021-02-15 ENCOUNTER — Encounter (INDEPENDENT_AMBULATORY_CARE_PROVIDER_SITE_OTHER): Payer: Self-pay | Admitting: Ophthalmology

## 2021-02-15 ENCOUNTER — Other Ambulatory Visit: Payer: Self-pay

## 2021-02-15 ENCOUNTER — Ambulatory Visit (INDEPENDENT_AMBULATORY_CARE_PROVIDER_SITE_OTHER): Payer: PPO | Admitting: Ophthalmology

## 2021-02-15 DIAGNOSIS — H4052X2 Glaucoma secondary to other eye disorders, left eye, moderate stage: Secondary | ICD-10-CM | POA: Diagnosis not present

## 2021-02-15 DIAGNOSIS — T8522XA Displacement of intraocular lens, initial encounter: Secondary | ICD-10-CM | POA: Diagnosis not present

## 2021-02-15 DIAGNOSIS — H35352 Cystoid macular degeneration, left eye: Secondary | ICD-10-CM

## 2021-02-15 NOTE — Assessment & Plan Note (Signed)
Onset post scleral tunnel fixation of Yamane technique of Zeiss CT Antlers lens,  Patient discontinued topical medications December 2022 as visual acuity and improved and has not developed any recurrences of blurred vision

## 2021-02-15 NOTE — Assessment & Plan Note (Signed)
OS, looks great, post vitrectomy and placement of Yamane scleral tunnel three-piece IOL January 2022  Postop CME has now abated

## 2021-02-15 NOTE — Progress Notes (Signed)
02/15/2021     CHIEF COMPLAINT Patient presents for  Chief Complaint  Patient presents with   Cystoid Macular Edema      HISTORY OF PRESENT ILLNESS: Albert Benjamin is a 71 y.o. male who presents to the clinic today for:   HPI   5 mos fu OU oct, CME OS. Patient states vision is stable and unchanged since last visit. Denies any new floaters or FOL. Pt states he is not using any prescription eye drops anymore.  Ketorolac, as the drops ran out, states he stopped them around November or the 1st of December.  Patient did not refill these or continue them as the vision had improved nicely Last edited by Edmon Crape, MD on 02/15/2021  9:37 AM.      Referring physician: Albin Felling, OD [address] [place],  [place] [address]  HISTORICAL INFORMATION:   Selected notes from the MEDICAL RECORD NUMBER       CURRENT MEDICATIONS: Current Outpatient Medications (Ophthalmic Drugs)  Medication Sig   ketorolac (ACULAR) 0.5 % ophthalmic solution Place 1 drop into the left eye 4 (four) times daily.   No current facility-administered medications for this visit. (Ophthalmic Drugs)   Current Outpatient Medications (Other)  Medication Sig   cetirizine (ZYRTEC) 10 MG tablet Take 10 mg by mouth daily.   citalopram (CELEXA) 40 MG tablet Take 40 mg by mouth every evening.   cyclobenzaprine (FLEXERIL) 10 MG tablet Take 1 tablet (10 mg total) by mouth 3 (three) times daily as needed for muscle spasms.   HYDROcodone-acetaminophen (NORCO) 10-325 MG tablet Take 1 tablet by mouth every 4 (four) hours as needed for severe pain ((score 7 to 10)).   lisinopril (ZESTRIL) 2.5 MG tablet Take 2.5 mg by mouth daily.   montelukast (SINGULAIR) 10 MG tablet Take 10 mg by mouth at bedtime.   pioglitazone (ACTOS) 15 MG tablet Take 15 mg by mouth daily.   pravastatin (PRAVACHOL) 40 MG tablet Take 40 mg by mouth every evening.   No current facility-administered medications for this visit. (Other)       REVIEW OF SYSTEMS: ROS   Negative for: Constitutional, Gastrointestinal, Neurological, Skin, Genitourinary, Musculoskeletal, HENT, Endocrine, Cardiovascular, Eyes, Respiratory, Psychiatric, Allergic/Imm, Heme/Lymph Last edited by Edmon Crape, MD on 02/15/2021  9:37 AM.       ALLERGIES Allergies  Allergen Reactions   Flexeril [Cyclobenzaprine] Other (See Comments)    Hallucinations-reported per patient's account    PAST MEDICAL HISTORY Past Medical History:  Diagnosis Date   Allergy    Aphakia, left eye 02/03/2020   Secondary insertion of posterior chamber intraocular lens via Yamani scleral tunnel technique 02/02/2020 left eye   Arthritis    Cataract    bilkateral surgery 3-40 years ago   Depression    Diabetes mellitus    Hypertension    Obstructive sleep apnea on CPAP    Pneumonia    Sleep apnea    does not use CPAP   Vitreous prolapse, left eye 01/11/2020   Wears glasses    Wears hearing aid    both ears   Past Surgical History:  Procedure Laterality Date   ANTERIOR CERVICAL DECOMP/DISCECTOMY FUSION N/A 03/11/2012   Procedure: ANTERIOR CERVICAL DECOMPRESSION/DISCECTOMY FUSION 3 LEVELS;  Surgeon: Karn Cassis, MD;  Location: [facility];  Service: Neurosurgery;  Laterality: N/A;  Anterior cervical decompression/fusion Cervical four-five, Cervical five-six, Cervical six-seven   COLONOSCOPY     EYE SURGERY  catarct bil   KNEE ARTHROSCOPY  2007   left, right 09/2011   LUMBAR LAMINECTOMY/DECOMPRESSION MICRODISCECTOMY N/A 08/28/2020   Procedure: Laminectomy and Foraminotomy - Lumbar Two-Lumbar Three, Lumbar Three-Lumbar Four, Lumbar Four-Lumbar Five,  Lumbar Five-Sacral One;  Surgeon: Julio Sicks, MD;  Location: [facility];  Service: Neurosurgery;  Laterality: N/A;   SHOULDER ARTHROSCOPY  2005,2010-   rightx2-dsc    FAMILY HISTORY Family History  Problem Relation Age of Onset   Diabetes Mother    Colon cancer Neg Hx    Heart disease Neg Hx    Esophageal  cancer Neg Hx    Pancreatic cancer Neg Hx    Stomach cancer Neg Hx    Rectal cancer Neg Hx     SOCIAL HISTORY Social History   Tobacco Use   Smoking status: Never   Smokeless tobacco: Never  Vaping Use   Vaping Use: Never used  Substance Use Topics   Alcohol use: No   Drug use: No         OPHTHALMIC EXAM:  Base Eye Exam     Visual Acuity (ETDRS)       Right Left   Dist cc 20/20 -1 20/20 -1    Correction: Glasses         Tonometry (Tonopen, 9:27 AM)       Right Left   Pressure 20 16         Pupils       Pupils Dark Light APD   Right PERRL 4 3 None   Left PERRL 4 3 None         Visual Fields (Counting fingers)       Left Right    Full Full         Extraocular Movement       Right Left    Full Full         Neuro/Psych     Oriented x3: Yes   Mood/Affect: Normal         Dilation     Both eyes: 1.0% Mydriacyl, 2.5% Phenylephrine @ 9:27 AM           Slit Lamp and Fundus Exam     External Exam       Right Left   External Normal Normal         Slit Lamp Exam       Right Left   Lids/Lashes Normal Normal   Conjunctiva/Sclera White and quiet White and quiet, no exposed haptic tags   Cornea Clear Clear,   Anterior Chamber Deep and quiet Deep and quiet, no pigment   Iris Round and reactive Round and reactive, PI inferior 630, with iris transillumination defects nearly 360   Lens Centered posterior chamber intraocular lens, 3 piece Well centered Zeiss PCIOL, CT Lucia   Anterior Vitreous Normal Clear avitric, not dilated         Fundus Exam       Right Left   Posterior Vitreous Posterior vitreous detachment Clear, avitric   Disc  Normal   C/D Ratio 0.35 0.35   Macula  Normal   Vessels  Normal   Periphery  Normal,  attached peripherally.            IMAGING AND PROCEDURES  Imaging and Procedures for 02/15/21  OCT, Retina - OU - Both Eyes       Right Eye Quality was good. Scan locations included  subfoveal. Central Foveal Thickness: 310. Progression has been stable. Findings include  normal foveal contour.   Left Eye Quality was good. Scan locations included subfoveal. Central Foveal Thickness: 319. Progression has improved. Findings include cystoid macular edema.   Notes OS, vastly improved CME on topical NSAIDs.  And resolved now all 10 to 11 weeks weeks off of topical therapy               ASSESSMENT/PLAN:  Dislocated IOL (intraocular lens), posterior, left OS, looks great, post vitrectomy and placement of Yamane scleral tunnel three-piece IOL January 2022  Postop CME has now abated  Cystoid macular edema of left eye Onset post scleral tunnel fixation of Yamane technique of Zeiss CT Ardoch lens,  Patient discontinued topical medications December 2022 as visual acuity and improved and has not developed any recurrences of blurred vision     ICD-10-CM   1. Cystoid macular edema of left eye  H35.352 OCT, Retina - OU - Both Eyes    2. Dislocated IOL (intraocular lens), posterior, left  T85.22XA     3. 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  OS vastly improved now CME completely resolved some 9 to 12 weeks post cessation of topical therapy.  I did explain the patient that CME could recur in the future but this is less likely now that YAG laser iridotomy has been performed to prevent and minimize the risk of iris chafe to the IOL.  2.  Excellent visual acuity has been return, patient says each eye is now equal  3.  Follow-up with Dr. Precious Bard as scheduled  Ophthalmic Meds Ordered this visit:  No orders of the defined types were placed in this encounter.      Return in about 1 year (around 02/15/2022) for DILATE OU, COLOR FP, OCT.  There are no Patient Instructions on file for this visit.   Explained the diagnoses, plan, and follow up with the patient and they expressed understanding.  Patient expressed  understanding of the importance of proper follow up care.   Alford Highland Aubrey Voong M.D. Diseases & Surgery of the Retina and Vitreous [facility] 02/15/21     Abbreviations: M myopia (nearsighted); A astigmatism; H hyperopia (farsighted); P presbyopia; Mrx spectacle prescription;  CTL contact lenses; OD right eye; OS left eye; OU both eyes  XT exotropia; ET esotropia; PEK punctate epithelial keratitis; PEE punctate epithelial erosions; DES dry eye syndrome; MGD meibomian gland dysfunction; ATs artificial tears; PFAT's preservative free artificial tears; NSC nuclear sclerotic cataract; PSC posterior subcapsular cataract; ERM epi-retinal membrane; PVD posterior vitreous detachment; RD retinal detachment; DM diabetes mellitus; DR diabetic retinopathy; NPDR non-proliferative diabetic retinopathy; PDR proliferative diabetic retinopathy; CSME clinically significant macular edema; DME diabetic macular edema; dbh dot blot hemorrhages; CWS cotton wool spot; POAG primary open angle glaucoma; C/D cup-to-disc ratio; HVF humphrey visual field; GVF goldmann visual field; OCT optical coherence tomography; IOP intraocular pressure; BRVO Branch retinal vein occlusion; CRVO central retinal vein occlusion; CRAO central retinal artery occlusion; BRAO branch retinal artery occlusion; RT retinal tear; SB scleral buckle; PPV pars plana vitrectomy; VH Vitreous hemorrhage; PRP panretinal laser photocoagulation; IVK intravitreal kenalog; VMT vitreomacular traction; MH Macular hole;  NVD neovascularization of the disc; NVE neovascularization elsewhere; AREDS age related eye disease study; ARMD age related macular degeneration; POAG primary open angle glaucoma; EBMD epithelial/anterior basement membrane dystrophy; ACIOL anterior chamber intraocular lens; IOL intraocular lens; PCIOL posterior chamber intraocular lens; Phaco/IOL phacoemulsification with intraocular lens placement; PRK photorefractive  keratectomy; LASIK laser  assisted in situ keratomileusis; HTN hypertension; DM diabetes mellitus; COPD chronic obstructive pulmonary disease

## 2021-04-16 DIAGNOSIS — Z1331 Encounter for screening for depression: Secondary | ICD-10-CM | POA: Diagnosis not present

## 2021-04-16 DIAGNOSIS — Z0000 Encounter for general adult medical examination without abnormal findings: Secondary | ICD-10-CM | POA: Diagnosis not present

## 2021-04-16 DIAGNOSIS — Z125 Encounter for screening for malignant neoplasm of prostate: Secondary | ICD-10-CM | POA: Diagnosis not present

## 2021-04-16 DIAGNOSIS — Z6837 Body mass index (BMI) 37.0-37.9, adult: Secondary | ICD-10-CM | POA: Diagnosis not present

## 2021-04-16 DIAGNOSIS — G894 Chronic pain syndrome: Secondary | ICD-10-CM | POA: Diagnosis not present

## 2021-04-16 DIAGNOSIS — E7800 Pure hypercholesterolemia, unspecified: Secondary | ICD-10-CM | POA: Diagnosis not present

## 2021-04-16 DIAGNOSIS — I10 Essential (primary) hypertension: Secondary | ICD-10-CM | POA: Diagnosis not present

## 2021-04-16 DIAGNOSIS — E1165 Type 2 diabetes mellitus with hyperglycemia: Secondary | ICD-10-CM | POA: Diagnosis not present

## 2021-04-30 DIAGNOSIS — J324 Chronic pansinusitis: Secondary | ICD-10-CM | POA: Diagnosis not present

## 2021-05-06 DIAGNOSIS — I10 Essential (primary) hypertension: Secondary | ICD-10-CM | POA: Diagnosis not present

## 2021-05-06 DIAGNOSIS — E785 Hyperlipidemia, unspecified: Secondary | ICD-10-CM | POA: Diagnosis not present

## 2021-06-07 DIAGNOSIS — Z9889 Other specified postprocedural states: Secondary | ICD-10-CM | POA: Diagnosis not present

## 2021-06-07 DIAGNOSIS — M48062 Spinal stenosis, lumbar region with neurogenic claudication: Secondary | ICD-10-CM | POA: Diagnosis not present

## 2021-07-05 DIAGNOSIS — M25532 Pain in left wrist: Secondary | ICD-10-CM | POA: Diagnosis not present

## 2021-07-18 DIAGNOSIS — E119 Type 2 diabetes mellitus without complications: Secondary | ICD-10-CM | POA: Diagnosis not present

## 2021-07-19 DIAGNOSIS — M25532 Pain in left wrist: Secondary | ICD-10-CM | POA: Diagnosis not present

## 2021-08-27 DIAGNOSIS — L219 Seborrheic dermatitis, unspecified: Secondary | ICD-10-CM | POA: Diagnosis not present

## 2021-08-27 DIAGNOSIS — L820 Inflamed seborrheic keratosis: Secondary | ICD-10-CM | POA: Diagnosis not present

## 2021-09-12 DIAGNOSIS — Z6835 Body mass index (BMI) 35.0-35.9, adult: Secondary | ICD-10-CM | POA: Diagnosis not present

## 2021-09-12 DIAGNOSIS — M48062 Spinal stenosis, lumbar region with neurogenic claudication: Secondary | ICD-10-CM | POA: Diagnosis not present

## 2021-10-09 DIAGNOSIS — S8261XA Displaced fracture of lateral malleolus of right fibula, initial encounter for closed fracture: Secondary | ICD-10-CM | POA: Diagnosis not present

## 2021-10-10 DIAGNOSIS — S8261XA Displaced fracture of lateral malleolus of right fibula, initial encounter for closed fracture: Secondary | ICD-10-CM | POA: Diagnosis not present

## 2021-10-11 DIAGNOSIS — S8261XA Displaced fracture of lateral malleolus of right fibula, initial encounter for closed fracture: Secondary | ICD-10-CM | POA: Diagnosis not present

## 2021-10-11 DIAGNOSIS — S93431A Sprain of tibiofibular ligament of right ankle, initial encounter: Secondary | ICD-10-CM | POA: Diagnosis not present

## 2021-10-11 DIAGNOSIS — S82841A Displaced bimalleolar fracture of right lower leg, initial encounter for closed fracture: Secondary | ICD-10-CM | POA: Diagnosis not present

## 2021-10-11 DIAGNOSIS — X58XXXA Exposure to other specified factors, initial encounter: Secondary | ICD-10-CM | POA: Diagnosis not present

## 2021-10-11 DIAGNOSIS — Y999 Unspecified external cause status: Secondary | ICD-10-CM | POA: Diagnosis not present

## 2021-10-22 DIAGNOSIS — S82841D Displaced bimalleolar fracture of right lower leg, subsequent encounter for closed fracture with routine healing: Secondary | ICD-10-CM | POA: Diagnosis not present

## 2021-10-29 DIAGNOSIS — S82841D Displaced bimalleolar fracture of right lower leg, subsequent encounter for closed fracture with routine healing: Secondary | ICD-10-CM | POA: Diagnosis not present

## 2021-11-05 DIAGNOSIS — S82841D Displaced bimalleolar fracture of right lower leg, subsequent encounter for closed fracture with routine healing: Secondary | ICD-10-CM | POA: Diagnosis not present

## 2021-11-19 DIAGNOSIS — S82841D Displaced bimalleolar fracture of right lower leg, subsequent encounter for closed fracture with routine healing: Secondary | ICD-10-CM | POA: Diagnosis not present

## 2021-11-23 DIAGNOSIS — M25571 Pain in right ankle and joints of right foot: Secondary | ICD-10-CM | POA: Diagnosis not present

## 2021-11-23 DIAGNOSIS — R2689 Other abnormalities of gait and mobility: Secondary | ICD-10-CM | POA: Diagnosis not present

## 2021-11-26 DIAGNOSIS — R2689 Other abnormalities of gait and mobility: Secondary | ICD-10-CM | POA: Diagnosis not present

## 2021-11-26 DIAGNOSIS — M25571 Pain in right ankle and joints of right foot: Secondary | ICD-10-CM | POA: Diagnosis not present

## 2021-11-28 DIAGNOSIS — R0981 Nasal congestion: Secondary | ICD-10-CM | POA: Diagnosis not present

## 2021-11-28 DIAGNOSIS — J019 Acute sinusitis, unspecified: Secondary | ICD-10-CM | POA: Diagnosis not present

## 2021-11-28 DIAGNOSIS — R509 Fever, unspecified: Secondary | ICD-10-CM | POA: Diagnosis not present

## 2021-11-28 DIAGNOSIS — R051 Acute cough: Secondary | ICD-10-CM | POA: Diagnosis not present

## 2021-12-03 DIAGNOSIS — R2689 Other abnormalities of gait and mobility: Secondary | ICD-10-CM | POA: Diagnosis not present

## 2021-12-03 DIAGNOSIS — M25571 Pain in right ankle and joints of right foot: Secondary | ICD-10-CM | POA: Diagnosis not present

## 2021-12-05 DIAGNOSIS — M25571 Pain in right ankle and joints of right foot: Secondary | ICD-10-CM | POA: Diagnosis not present

## 2021-12-05 DIAGNOSIS — R2689 Other abnormalities of gait and mobility: Secondary | ICD-10-CM | POA: Diagnosis not present

## 2021-12-07 ENCOUNTER — Ambulatory Visit: Payer: PPO | Admitting: Internal Medicine

## 2021-12-07 ENCOUNTER — Encounter: Payer: Self-pay | Admitting: Internal Medicine

## 2021-12-07 VITALS — BP 118/70 | HR 81 | Temp 97.9°F | Resp 16 | Ht 66.0 in | Wt 243.0 lb

## 2021-12-07 DIAGNOSIS — J0100 Acute maxillary sinusitis, unspecified: Secondary | ICD-10-CM | POA: Insufficient documentation

## 2021-12-07 DIAGNOSIS — E119 Type 2 diabetes mellitus without complications: Secondary | ICD-10-CM | POA: Diagnosis not present

## 2021-12-07 DIAGNOSIS — I10 Essential (primary) hypertension: Secondary | ICD-10-CM | POA: Insufficient documentation

## 2021-12-07 LAB — HEMOGLOBIN A1C
Est. average glucose Bld gHb Est-mCnc: 140 mg/dL
Hgb A1c MFr Bld: 6.5 % — ABNORMAL HIGH (ref 4.8–5.6)

## 2021-12-07 MED ORDER — MOUNJARO 10 MG/0.5ML ~~LOC~~ SOAJ: 10.0000 mg | SUBCUTANEOUS | 0 refills | Status: AC

## 2021-12-07 MED ORDER — MOUNJARO 5 MG/0.5ML ~~LOC~~ SOAJ: 5.0000 mg | SUBCUTANEOUS | 0 refills | Status: DC

## 2021-12-07 MED ORDER — LEVOFLOXACIN 750 MG PO TABS: 750.0000 mg | ORAL_TABLET | Freq: Every day | ORAL | 0 refills | Status: AC

## 2021-12-07 MED ORDER — MOUNJARO 2.5 MG/0.5ML ~~LOC~~ SOAJ: 2.5000 mg | SUBCUTANEOUS | 0 refills | Status: AC

## 2021-12-07 MED ORDER — MOUNJARO 7.5 MG/0.5ML ~~LOC~~ SOAJ: 7.5000 mg | SUBCUTANEOUS | 0 refills | Status: DC

## 2021-12-07 NOTE — Assessment & Plan Note (Signed)
He has T2 diabetes that has been controlled.  He wants to lose weight and we will try him back on mounjaro.  He is to watch for any nausea or vomiting.  We will start him on moujaro 2.5mg  weekly.  I want him to stop his actos at this time.  He needs to eat healthy and get back to exercise.

## 2021-12-07 NOTE — Progress Notes (Unsigned)
Office Visit  Subjective   Patient ID: SINJIN LIU   DOB: 03/02/1950   Age: 71 y.o.   MRN: [id number]   Chief Complaint Chief Complaint  Patient presents with   Follow-up    Diabetes     History of Present Illness The patient is a 71 year old Caucasian/White male who presents with upper respiratory tract symptoms which began 2 weeks ago. He states it began as some post nasal drip at that time but then settled into his chest where he began having cough productive of green/yellow sputum.  He states he has had some wheezing.  The patient went to the urgent care last week where they gave him augmetin but he states this did not help and he is still having cough productive of green sputum.  They tested him for COVID and the flu and he was negative.  Today, he denies nasal congestion, sore throat,  body aches, fever, headache, shortness of breath, nausea, vomiting, diarrhea, and chills. Alleviating factors: none. The patient's past medical history is notable for a history of allergies.  He takes zyrtec in AM and singulair at night.  The patient is a 71 year old Caucasian/White male who returns for a follow-up visit for his diabetes.  This past year, his HgBA1c was not controlled and I asked him to increased his actos back to 30mg  daily.  The patient was placed on a trial off actos this past year due to him losing weight by diet and exercise.  He had lost over 36 lbs over the last year at that time.  We had previously cut back his actos from 30mg  to 15mg  daily but again stopped his actos.  We had to restart actos where his diabetes subsequently became uncontrolled.  I wanted to try him on mounjaro for diabetes and weight loss but the patient's insurance company would not pay for this.  His current medications include:  Actos 30mg  daily.  I also asked him to start jardiance 10mg  daily but he did not do this.  He specifically denies unexplained abdominal pain, nausea, vomiting, or documented hypoglycemia.   He does not routinely check his  blood sugars. He came in fasting today in anticipation of lab work. His last HgBA1c was done in 07/2021 and was 6.6%. There is no diabetic complications of diabetic neuropathy, nephropathy, retinopathy or cardiovascular disease.  His last dilated eye exam was done by Dr. Renaldo Fiddler on 05/15/2021 and this showed no evidence of diabetic retinopathy.   Again, he is seeing the opthamologist regularly for a dislocated ocular lense and edema of the eye with history of glaucoma.   They used a YAG laser on his eye for his lense and they replaced this lense.  The patient is a 71 year old Caucasian/White male who presents for a follow-up evaluation of hypertension.  He was on lisinopril but he stopped it in the past because his BP was "normal" at home.  The patient has  been checking his blood pressure at home with systolic BP running in the 120's.  He is supposed to be on lisinopril for kidney protection but during his surgeries his BP was low so he stopped it.  The patient denies any headache, visual changes, dizziness, lightheadness, chest pain, shortness of breath, weakness/numbness, and edema. He reports there have been no other symptoms noted.     Past Medical History Past Medical History:  Diagnosis Date   Allergic rhinitis    Allergy    Aphakia,  left eye 02/03/2020   Secondary insertion of posterior chamber intraocular lens via Yamani scleral tunnel technique 02/02/2020 left eye   Arthritis    Cataract    bilkateral surgery 3-40 years ago   Depression    Diabetes mellitus    Erectile dysfunction    GAD (generalized anxiety disorder)    Hyperlipidemia    Hypertension    Obstructive sleep apnea on CPAP    Pneumonia    Sleep apnea    does not use CPAP   T2DM (type 2 diabetes mellitus) (HCC)    Vitreous prolapse, left eye 01/11/2020   Wears glasses    Wears hearing aid    both ears     Allergies Allergies  Allergen Reactions   Flexeril [Cyclobenzaprine] Other  (See Comments)    Hallucinations-reported per patient's account     Review of Systems Review of Systems  Constitutional:  Negative for chills and fever.  HENT:  Positive for congestion. Negative for sore throat.        Left ear pressure  Eyes:  Negative for blurred vision and double vision.  Respiratory:  Positive for cough and sputum production. Negative for shortness of breath and wheezing.   Cardiovascular:  Negative for chest pain, palpitations and leg swelling.  Gastrointestinal:  Negative for abdominal pain, constipation, diarrhea, nausea and vomiting.  Musculoskeletal:  Negative for myalgias.  Skin:  Negative for itching and rash.  Neurological:  Negative for dizziness, focal weakness and headaches.  Psychiatric/Behavioral:  Negative for depression. The patient is not nervous/anxious.        Objective:    Vitals BP 118/70 (BP Location: Left Arm, Patient Position: Sitting, Cuff Size: Normal)   Pulse 81   Temp 97.9 F (36.6 C) (Temporal)   Resp 16   Ht 5\' 6"  (1.676 m)   Wt 243 lb 0.2 oz (110.2 kg)   SpO2 97%   BMI 39.22 kg/m    Physical Examination Physical Exam Constitutional:      Appearance: Normal appearance. He is not ill-appearing.  HENT:     Right Ear: Tympanic membrane, ear canal and external ear normal.     Left Ear: Ear canal and external ear normal.     Ears:     Comments: He has a clear effusion behind his left ear but it is not infected.    Nose: Nose normal. No congestion or rhinorrhea.     Mouth/Throat:     Mouth: Mucous membranes are moist.     Pharynx: Oropharynx is clear.  Cardiovascular:     Rate and Rhythm: Normal rate and regular rhythm.     Pulses: Normal pulses.     Heart sounds: Normal heart sounds. No murmur heard.    No friction rub. No gallop.  Pulmonary:     Effort: Pulmonary effort is normal. No respiratory distress.     Breath sounds: Normal breath sounds. No wheezing, rhonchi or rales.  Abdominal:     General: Abdomen is  flat. Bowel sounds are normal. There is no distension.     Palpations: Abdomen is soft.     Tenderness: There is no abdominal tenderness.  Musculoskeletal:     Right lower leg: No edema.     Left lower leg: No edema.  Skin:    General: Skin is warm and dry.     Findings: No rash.  Neurological:     Mental Status: He is alert.  Psychiatric:        Mood and  Affect: Mood normal.        Behavior: Behavior normal.        Assessment & Plan:   Acute maxillary sinusitis He completed a course of augmentin but he is still having problems with productive cough.  I want him to start on flonase (he has this at home) and we will give him levaquin at this time.  Continue supportive care.  Essential hypertension His BP has been controlled and he stopped his lisinopril on his own.  I wanted him on lisinopril for kidney protection in the setting of diabetes.  We may need to restart lisinopril in the near future.  Diabetes [facility]) He has T2 diabetes that has been controlled.  He wants to lose weight and we will try him back on mounjaro.  He is to watch for any nausea or vomiting.  We will start him on moujaro 2.5mg  weekly.  I want him to stop his actos at this time.  He needs to eat healthy and get back to exercise.    Return in about 19 weeks (around 04/19/2022) for annual.    Townsend Roger, MD

## 2021-12-07 NOTE — Assessment & Plan Note (Signed)
His BP has been controlled and he stopped his lisinopril on his own.  I wanted him on lisinopril for kidney protection in the setting of diabetes.  We may need to restart lisinopril in the near future.

## 2021-12-07 NOTE — Assessment & Plan Note (Signed)
He completed a course of augmentin but he is still having problems with productive cough.  I want him to start on flonase (he has this at home) and we will give him levaquin at this time.  Continue supportive care.

## 2021-12-10 DIAGNOSIS — M25571 Pain in right ankle and joints of right foot: Secondary | ICD-10-CM | POA: Diagnosis not present

## 2021-12-10 DIAGNOSIS — R2689 Other abnormalities of gait and mobility: Secondary | ICD-10-CM | POA: Diagnosis not present

## 2021-12-12 DIAGNOSIS — M25571 Pain in right ankle and joints of right foot: Secondary | ICD-10-CM | POA: Diagnosis not present

## 2021-12-12 DIAGNOSIS — R2689 Other abnormalities of gait and mobility: Secondary | ICD-10-CM | POA: Diagnosis not present

## 2021-12-17 DIAGNOSIS — S82841D Displaced bimalleolar fracture of right lower leg, subsequent encounter for closed fracture with routine healing: Secondary | ICD-10-CM | POA: Diagnosis not present

## 2021-12-18 DIAGNOSIS — J019 Acute sinusitis, unspecified: Secondary | ICD-10-CM | POA: Diagnosis not present

## 2021-12-18 DIAGNOSIS — R0981 Nasal congestion: Secondary | ICD-10-CM | POA: Diagnosis not present

## 2021-12-18 DIAGNOSIS — J209 Acute bronchitis, unspecified: Secondary | ICD-10-CM | POA: Diagnosis not present

## 2022-01-08 ENCOUNTER — Other Ambulatory Visit: Payer: Self-pay

## 2022-01-08 DIAGNOSIS — Z6836 Body mass index (BMI) 36.0-36.9, adult: Secondary | ICD-10-CM | POA: Diagnosis not present

## 2022-01-08 DIAGNOSIS — M48062 Spinal stenosis, lumbar region with neurogenic claudication: Secondary | ICD-10-CM | POA: Diagnosis not present

## 2022-01-08 MED ORDER — MOUNJARO 5 MG/0.5ML ~~LOC~~ SOAJ: 5.0000 mg | SUBCUTANEOUS | 0 refills | Status: DC

## 2022-01-23 ENCOUNTER — Other Ambulatory Visit: Payer: Self-pay

## 2022-01-23 MED ORDER — GLUCOSE BLOOD VI STRP: ORAL_STRIP | 12 refills | Status: AC

## 2022-02-12 ENCOUNTER — Other Ambulatory Visit: Payer: Self-pay | Admitting: Internal Medicine

## 2022-02-18 ENCOUNTER — Encounter (INDEPENDENT_AMBULATORY_CARE_PROVIDER_SITE_OTHER): Payer: PPO | Admitting: Ophthalmology

## 2022-02-18 ENCOUNTER — Encounter (INDEPENDENT_AMBULATORY_CARE_PROVIDER_SITE_OTHER): Payer: Self-pay

## 2022-02-18 DIAGNOSIS — H2702 Aphakia, left eye: Secondary | ICD-10-CM | POA: Diagnosis not present

## 2022-02-18 DIAGNOSIS — H35352 Cystoid macular degeneration, left eye: Secondary | ICD-10-CM | POA: Diagnosis not present

## 2022-02-18 DIAGNOSIS — H4052X2 Glaucoma secondary to other eye disorders, left eye, moderate stage: Secondary | ICD-10-CM | POA: Diagnosis not present

## 2022-03-08 ENCOUNTER — Other Ambulatory Visit: Payer: Self-pay | Admitting: Internal Medicine

## 2022-03-08 MED ORDER — PIOGLITAZONE HCL 30 MG PO TABS: 30.0000 mg | ORAL_TABLET | Freq: Every day | ORAL | 3 refills | Status: DC

## 2022-03-09 ENCOUNTER — Other Ambulatory Visit: Payer: Self-pay | Admitting: Internal Medicine

## 2022-03-11 ENCOUNTER — Other Ambulatory Visit: Payer: Self-pay | Admitting: Physician Assistant

## 2022-04-01 ENCOUNTER — Other Ambulatory Visit: Payer: Self-pay | Admitting: Internal Medicine

## 2022-04-19 ENCOUNTER — Encounter: Payer: Self-pay | Admitting: Internal Medicine

## 2022-04-19 ENCOUNTER — Ambulatory Visit: Payer: PPO | Admitting: Internal Medicine

## 2022-04-19 VITALS — BP 130/80 | HR 69 | Temp 98.1°F | Resp 16 | Ht 66.0 in | Wt 236.6 lb

## 2022-04-19 DIAGNOSIS — G894 Chronic pain syndrome: Secondary | ICD-10-CM | POA: Diagnosis not present

## 2022-04-19 DIAGNOSIS — E119 Type 2 diabetes mellitus without complications: Secondary | ICD-10-CM | POA: Diagnosis not present

## 2022-04-19 DIAGNOSIS — R3911 Hesitancy of micturition: Secondary | ICD-10-CM

## 2022-04-19 DIAGNOSIS — F411 Generalized anxiety disorder: Secondary | ICD-10-CM | POA: Insufficient documentation

## 2022-04-19 DIAGNOSIS — J302 Other seasonal allergic rhinitis: Secondary | ICD-10-CM

## 2022-04-19 DIAGNOSIS — M48062 Spinal stenosis, lumbar region with neurogenic claudication: Secondary | ICD-10-CM | POA: Diagnosis not present

## 2022-04-19 DIAGNOSIS — I10 Essential (primary) hypertension: Secondary | ICD-10-CM | POA: Diagnosis not present

## 2022-04-19 DIAGNOSIS — E7800 Pure hypercholesterolemia, unspecified: Secondary | ICD-10-CM | POA: Diagnosis not present

## 2022-04-19 DIAGNOSIS — Z6838 Body mass index (BMI) 38.0-38.9, adult: Secondary | ICD-10-CM | POA: Insufficient documentation

## 2022-04-19 DIAGNOSIS — Z0000 Encounter for general adult medical examination without abnormal findings: Secondary | ICD-10-CM

## 2022-04-19 MED ORDER — PRAVASTATIN SODIUM 40 MG PO TABS: 40.0000 mg | ORAL_TABLET | Freq: Every day | ORAL | 1 refills | Status: DC

## 2022-04-19 MED ORDER — SHINGRIX 50 MCG/0.5ML IM SUSR: 0.5000 mL | Freq: Once | INTRAMUSCULAR | 0 refills | Status: AC

## 2022-04-19 NOTE — Assessment & Plan Note (Signed)
He underwent decompressive laminectomies in the past.  Plan as above.

## 2022-04-19 NOTE — Assessment & Plan Note (Signed)
We will continue on celexa 40mg  daily which controls his anxiety.

## 2022-04-19 NOTE — Assessment & Plan Note (Signed)
He states his pain is tolerable and he will take gabapentin as needed and he also uses tylenol as needed.

## 2022-04-19 NOTE — Progress Notes (Signed)
Preventive Screening-Counseling & Management     Albert Benjamin is a 72 y.o. male who presents for Medicare Annual/Subsequent preventive examination.  Albert Benjamin is a 72 year old Caucasian/White male who presents for his annual wellness exam. He is due for the following health maintenance studies: screening labs. This patient's past medical history Anxiety Disorder, Generalized, Diabetes Mellitus, Type II, Erectile Dysfunction, Hyperlipidemia, Hypertension, Benign Essential, and Rhinitis, Allergic.   The patient had a dilated eye exam on 05/15/2021 with Dr. Precious Bard where they have been following him for a dislocated lense and glaucoma. He did not have evidence of diabetic retinopathy.  He has another eye exam scheduled for 08/2022.  We did do a FIT test on 11/2019 and this was positive for blood. He underwent a colonoscopy on 12/08/2019 and this showed moderate sigmoid diverticulosis and internal hemorrhoids. Dr. Chales Abrahams did not recommend another colonoscopy after this one in 2021.  His last colonoscopy was on 12/2011 and this demonstrated diverticulosis and internal hemorrhoids. His prior colonoscopy in 2010 showed polyps so Dr. Chales Abrahams wants to repeat his colonoscopy in 2018 due to a history of adenomatous polyps in the past. He denies any problems with urination. He states he is not exercising regularly due to his low back pain. He does get yearly flu vaccines. He a zostavax shingles vaccine in 2014. He had a prevnar 13 vaccine in 05/2016. He had the pneumovax 23 vaccine in 05/2021. He has had 3 COVID-19 vaccines with 1 booster. He does have a history of anxiety but no depression or memory loss.  The patient is not on an ASA.  The patient is a 72 year old Caucasian/White male who returns for a follow-up visit for his diabetes.  This past year, his HgBA1c was not controlled and I asked him to increased his actos back to 30mg  daily.  The patient was placed on a trial off actos this past year due to him  losing weight by diet and exercise.  He had lost over 36 lbs over the last year at that time.  We had previously cut back his actos from 30mg  to 15mg  daily but again stopped his actos.  We had to restart actos where his diabetes subsequently became uncontrolled.  On his last visit, we did start him on mounjaro but he had to stop this due to nausea and vomiting and we restarted him back on actos.  His current medications include:  Actos 30mg  daily.  I also asked him to start jardiance 10mg  daily in the past but he did not do this.  He specifically denies unexplained abdominal pain, nausea, vomiting, or documented hypoglycemia.  He does not routinely check his  blood sugars. He came in fasting today in anticipation of lab work. His last HgBA1c was done 3 months ago and was 6.5%. There is no diabetic complications of diabetic neuropathy, nephropathy, retinopathy or cardiovascular disease.  His last dilated eye exam was done by Dr. Precious Bard on 05/15/2021 and this showed no evidence of diabetic retinopathy.      The patient is a 72 year old Caucasian/White male who presents for a follow-up evaluation of hypertension.  He was on lisinopril but he stopped it in the past because his BP was "normal" at home.  The patient has  been checking his blood pressure at home with systolic BP running in the 120's.  He is supposed to be on lisinopril for kidney protection but during his surgeries his BP was low so he  stopped it. He is not on any blood pressure medicines at this time.  The patient denies any headache, visual changes, dizziness, lightheadness, chest pain, shortness of breath, weakness/numbness, and edema. He reports there have been no other symptoms noted.  The patient is a 72 yo male who underwent decompressive surgery for lumbar stenosis with neurogenic claudication on 08/28/2020.  He was having bilateral lower extremity numbness and weakness that worsened with standing and ambulation associated with low back pain.  He  had a MRI done in 2022 which showed evidence of severe stenosis at L2-L3, L3-L4, L4-L5, and L5-S1 with chronic disc herniations at L4-L5 and L5-S1.  He underwent decompressive laminectomies at these sites.  This did improve his pain where his pain before surgery was an 8 out 10 and after surgery was 2-3 out of 10.  Today, his legs will tingle at times but there is no full numbness and there is no weakness.  He was given gabapentin 200mg  po qhs prn which helps with his pain and allows him to go to sleep and he takes it as needed. Again, he has chronic back pain where he has seen the back and scoliosis clinic in the past.  He had a MRI and xrays which showed lumbar spondylosis and DDD.  He had moderate to severe central canal stenosis of L5-S1.  There was L4-L5 lateral recess stenosis with disc protrusion.  There was large central L5-S1 disc protrusion with nerve impingement.  He underwent physical therapy in the past which did not help.  He has received 2 epidural steriod injections which have helped with the pain in the past.  I initially did an ABI as he was having pain radiating down his left leg.  His ABI was normal.  He denies any new loss of bowel/bladder and no new weakness/numbness.  He gets back pain when he walks too far.  He also has a history of surgery on his right knee where he was found to have a meniscial tear. Albert Benjamin has had surgery for cervical stenosis in 03/2012 and had cervical fusion with plating.  He was seen by ortho and neurosurgeon and underwent PT.  He states he cannot lift anything with his right hand more than 5 lbs.  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  He has atrophy of his RUE and there has been no change in his strength over the last year.  Albert Benjamin returns today for routine followup on his cholesterol. Overall, he states he is doing well and is without any complaints  or problems at this time. He specifically denies abdominal pain, nausea, vomiting, diarrhea, myalgias, and fatigue. He remains on dietary management as well as pravastatin 40mg  po daily. He is fasting in anticipation for labs today.  Albert Benjamin also has a history of anxiety disorder where he remains on celexa 40mg  daily which he states has been stable.  Again, his anxiety is mild and stable.  We tried a gradual dose reduction on his celexa several years ago and his anxiety worsened so he went back on this.  He also remains on clonazepam but has not used this in over a year. He is not having panic attacks and denies any depression.  He has clonazepam which he uses prn which he uses maybe once per month.  Overall, he states his anxiety is mild and controlled and he  denies any depression.  He also has a history of allergies.  This usually affects him in the fall and spring.  He does zyrtec and he has had a steriod allergy shot in the past.  He also uses singulair which helps.      Are there smokers in your home (other than you)? No  Risk Factors Current exercise habits:  as above   Dietary issues discussed: none   Depression Screen (Note: if answer to either of the following is "Yes", a more complete depression screening is indicated)   Over the past two weeks, have you felt down, depressed or hopeless? No  Over the past two weeks, have you felt little interest or pleasure in doing things? No  Have you lost interest or pleasure in daily life? No  Do you often feel hopeless? No  Do you cry easily over simple problems? No  Activities of Daily Living In your present state of health, do you have any difficulty performing the following activities?:  Driving? No Managing money?  No Feeding yourself? No Getting from bed to chair? No Climbing a flight of stairs? No Preparing food and eating?: No Bathing or showering? No Getting dressed: No Getting to the toilet? No Using the toilet:No Moving  around from place to place: No In the past year have you fallen or had a near fall?:Yes- he fell last Nov and broke an ankle where he stepped off a ledge and his ankle turned over.   Are you sexually active?  No  Do you have more than one partner?  No  Hearing Difficulties: Yes- hearing aids Do you often ask people to speak up or repeat themselves? Yes Do you experience ringing or noises in your ears? Yes Do you have difficulty understanding soft or whispered voices? Yes   Do you feel that you have a problem with memory? No  Do you often misplace items? No  Do you feel safe at home?  Yes  Cognitive Testing  Alert? Yes  Normal Appearance?Yes  Oriented to person? Yes  Place? Yes   Time? Yes  Recall of three objects?  Yes  Can perform simple calculations? Yes  Displays appropriate judgment?Yes  Can read the correct time from a watch face?Yes  Fall Risk Prevention  Any stairs in or around the home? Yes  If so, are there any without handrails? Yes  Home free of loose throw rugs in walkways, pet beds, electrical cords, etc? Yes  Adequate lighting in your home to reduce risk of falls? Yes  Use of a cane, walker or w/c? No    Time Up and Go  Was the test performed? Yes .  Length of time to ambulate 10 feet: 8 sec.   Gait steady and fast without use of assistive device    Advanced Directives have been discussed with the patient? Yes   List the Names of Other Physician/Practitioners you currently use: Patient Care Team: Crist Fat, MD as PCP - General (Internal Medicine) Albin Felling, OD (Optometry)    Past Medical History:  Diagnosis Date   Allergic rhinitis    Aphakia, left eye 02/03/2020   Secondary insertion of posterior chamber intraocular lens via Yamani scleral tunnel technique 02/02/2020 left eye   Arthritis    Cataract    bilkateral surgery 3-40 years ago   Chronic pain syndrome    Depression    Erectile dysfunction    GAD (generalized anxiety  disorder)    Hyperlipidemia  Hypertension    Lumbar stenosis with neurogenic claudication    Obstructive sleep apnea on CPAP    T2DM (type 2 diabetes mellitus)    Vitreous prolapse, left eye 01/11/2020   Wears glasses    Wears hearing aid    both ears    Past Surgical History:  Procedure Laterality Date   ANTERIOR CERVICAL DECOMP/DISCECTOMY FUSION N/A 03/11/2012   Procedure: ANTERIOR CERVICAL DECOMPRESSION/DISCECTOMY FUSION 3 LEVELS;  Surgeon: Karn Cassis, MD;  Location: [facility];  Service: Neurosurgery;  Laterality: N/A;  Anterior cervical decompression/fusion Cervical four-five, Cervical five-six, Cervical six-seven   COLONOSCOPY     EYE SURGERY     catarct bil   KNEE ARTHROSCOPY  2007   left, right 09/2011   LUMBAR LAMINECTOMY/DECOMPRESSION MICRODISCECTOMY N/A 08/28/2020   Procedure: Laminectomy and Foraminotomy - Lumbar Two-Lumbar Three, Lumbar Three-Lumbar Four, Lumbar Four-Lumbar Five,  Lumbar Five-Sacral One;  Surgeon: Julio Sicks, MD;  Location: [facility];  Service: Neurosurgery;  Laterality: N/A;   SHOULDER ARTHROSCOPY  2005,2010-   [id number]      Current Medications  Current Outpatient Medications  Medication Sig Dispense Refill   pioglitazone (ACTOS) 30 MG tablet Take 1 tablet (30 mg total) by mouth daily. 90 tablet 3   cetirizine (ZYRTEC) 10 MG tablet Take 10 mg by mouth daily.     citalopram (CELEXA) 40 MG tablet TAKE 1 TABLET BY MOUTH ONCE DAILY 90 tablet 1   glucose blood test strip Use as instructed 100 each 12   montelukast (SINGULAIR) 10 MG tablet Take 1 tablet by mouth once daily in the evening. 90 tablet 1   pravastatin (PRAVACHOL) 40 MG tablet Take 1 tablet by mouth once daily at bedtime. 90 tablet 1   No current facility-administered medications for this visit.    Allergies Flexeril [cyclobenzaprine]   Social History Social History   Tobacco Use   Smoking status: Never   Smokeless tobacco: Never  Substance Use Topics   Alcohol use: No      Review of Systems Review of Systems  Constitutional:  Negative for chills, fever, malaise/fatigue and weight loss.  HENT:  Positive for hearing loss and tinnitus.   Eyes:  Negative for blurred vision and double vision.  Respiratory:  Negative for cough, hemoptysis, shortness of breath and wheezing.   Cardiovascular:  Negative for chest pain, palpitations and leg swelling.  Gastrointestinal:  Negative for abdominal pain, blood in stool, constipation, diarrhea, heartburn, melena, nausea and vomiting.  Genitourinary:  Negative for frequency and hematuria.  Musculoskeletal:  Positive for back pain. Negative for myalgias.  Skin:  Negative for itching and rash.  Neurological:  Negative for dizziness, weakness and headaches.  Psychiatric/Behavioral:  Negative for depression and memory loss. The patient is nervous/anxious. The patient does not have insomnia.      Physical Exam:      Body mass index is 38.19 kg/m. BP 130/80   Pulse 69   Temp 98.1 F (36.7 C)   Resp 16   Ht 5\' 6"  (1.676 m)   Wt 236 lb 9.6 oz (107.3 kg)   SpO2 96%   BMI 38.19 kg/m   Physical Exam Constitutional:      Appearance: Normal appearance. He is not ill-appearing.  HENT:     Head: Normocephalic and atraumatic.     Right Ear: Tympanic membrane, ear canal and external ear normal.     Left Ear: Tympanic membrane, ear canal and external ear normal.     Nose: Nose normal.  No congestion or rhinorrhea.     Mouth/Throat:     Mouth: Mucous membranes are dry.     Pharynx: Oropharynx is clear. No oropharyngeal exudate or posterior oropharyngeal erythema.  Eyes:     General: No scleral icterus.    Conjunctiva/sclera: Conjunctivae normal.     Pupils: Pupils are equal, round, and reactive to light.  Neck:     Vascular: No carotid bruit.  Cardiovascular:     Rate and Rhythm: Normal rate and regular rhythm.     Pulses: Normal pulses.     Heart sounds: No murmur heard.    No friction rub. No gallop.  Pulmonary:      Effort: Pulmonary effort is normal. No respiratory distress.     Breath sounds: No wheezing, rhonchi or rales.  Abdominal:     General: Abdomen is flat. Bowel sounds are normal. There is no distension.     Palpations: Abdomen is soft.     Tenderness: There is no abdominal tenderness.  Musculoskeletal:     Cervical back: Neck supple. No tenderness.     Right lower leg: No edema.     Left lower leg: No edema.  Lymphadenopathy:     Cervical: No cervical adenopathy.  Skin:    General: Skin is warm and dry.     Findings: No rash.  Neurological:     General: No focal deficit present.     Mental Status: He is alert and oriented to person, place, and time.  Psychiatric:        Mood and Affect: Mood normal.        Behavior: Behavior normal.      Assessment:      T2 Diabetes mellitus without complication  Essential hypertension  Chronic pain syndrome  Lumbar stenosis with neurogenic claudication  Hypercholesterolemia  GAD (generalized anxiety disorder)  Seasonal allergies  Urinary hesitancy  BMI 38.0-38.9,adult  Morbid obesity    Plan:     During the course of the visit the patient was educated and counseled about appropriate screening and preventive services including:   Pneumococcal vaccine  Influenza vaccine Colorectal cancer screening  Diet review for nutrition referral? Yes ____  Not Indicated _X___   Patient Instructions (the written plan) was given to the patient.  Essential hypertension He is not on any BP medicines.  He was on an ACE-I for his diabetes but his BP became low and stopped it.  His BP is doing well now.  T2 Diabetes mellitus without complication His HgBA1c was controlled on his last visit.  His diabetic foot exam was normal.  We will obtain some diabetic labs.  BMI 38.0-38.9,adult He has morbid obesity associated with T2 DM, HTN and HCL.  He is actively losing weight watching his diet but has problems with regular exercise due to  his back.  I want him to stay active.  Chronic pain syndrome He states his pain is tolerable and he will take gabapentin as needed and he also uses tylenol as needed.  GAD (generalized anxiety disorder) We will continue on celexa 40mg  daily which controls his anxiety.  Hypercholesterolemia We will check his FLP today where he remains on pravastatin.  Lumbar stenosis with neurogenic claudication He underwent decompressive laminectomies in the past.  Plan as above.  Morbid obesity Plan as above.  Seasonal allergies His allergies are stable and controlled.   Prevention Health maintenance discussed.  We discussed the shingrix vaccine and we will send this out.  We will obtain  some yearly labs.  Medicare Attestation I have personally reviewed: The patient's medical and social history Their use of alcohol, tobacco or illicit drugs Their current medications and supplements The patient's functional ability including ADLs,fall risks, home safety risks, cognitive, and hearing and visual impairment Diet and physical activities Evidence for depression or mood disorders  The patient's weight, height, and BMI have been recorded in the chart.  I have made referrals, counseling, and provided education to the patient based on review of the above and I have provided the patient with a written personalized care plan for preventive services.     Crist Fat, MD   04/19/2022

## 2022-04-19 NOTE — Assessment & Plan Note (Signed)
He is not on any BP medicines.  He was on an ACE-I for his diabetes but his BP became low and stopped it.  His BP is doing well now.

## 2022-04-19 NOTE — Assessment & Plan Note (Signed)
Plan as above.  

## 2022-04-19 NOTE — Assessment & Plan Note (Signed)
His allergies are stable and controlled.

## 2022-04-19 NOTE — Assessment & Plan Note (Signed)
We will check his FLP today where he remains on pravastatin.

## 2022-04-19 NOTE — Assessment & Plan Note (Signed)
He has morbid obesity associated with T2 DM, HTN and HCL.  He is actively losing weight watching his diet but has problems with regular exercise due to his back.  I want him to stay active.

## 2022-04-19 NOTE — Assessment & Plan Note (Signed)
His HgBA1c was controlled on his last visit.  His diabetic foot exam was normal.  We will obtain some diabetic labs.

## 2022-04-20 LAB — HEMOGLOBIN A1C
Est. average glucose Bld gHb Est-mCnc: 154 mg/dL
Hgb A1c MFr Bld: 7 % — ABNORMAL HIGH (ref 4.8–5.6)

## 2022-04-20 LAB — CMP14 + ANION GAP
ALT: 18 IU/L (ref 0–44)
AST: 19 IU/L (ref 0–40)
Albumin/Globulin Ratio: 1.8 (ref 1.2–2.2)
Albumin: 4.2 g/dL (ref 3.8–4.8)
Alkaline Phosphatase: 84 IU/L (ref 44–121)
Anion Gap: 14 mmol/L (ref 10.0–18.0)
BUN/Creatinine Ratio: 21 (ref 10–24)
BUN: 24 mg/dL (ref 8–27)
Bilirubin Total: 0.5 mg/dL (ref 0.0–1.2)
CO2: 23 mmol/L (ref 20–29)
Calcium: 9.3 mg/dL (ref 8.6–10.2)
Chloride: 103 mmol/L (ref 96–106)
Creatinine, Ser: 1.13 mg/dL (ref 0.76–1.27)
Globulin, Total: 2.3 g/dL (ref 1.5–4.5)
Glucose: 119 mg/dL — ABNORMAL HIGH (ref 70–99)
Potassium: 5.4 mmol/L — ABNORMAL HIGH (ref 3.5–5.2)
Sodium: 140 mmol/L (ref 134–144)
Total Protein: 6.5 g/dL (ref 6.0–8.5)
eGFR: 69 mL/min/{1.73_m2} (ref >59)

## 2022-04-20 LAB — CBC WITH DIFFERENTIAL/PLATELET
Basophils Absolute: 0 10*3/uL (ref 0.0–0.2)
Basos: 1 %
EOS (ABSOLUTE): 0.1 10*3/uL (ref 0.0–0.4)
Eos: 2 %
Hematocrit: 43.3 % (ref 37.5–51.0)
Hemoglobin: 14.6 g/dL (ref 13.0–17.7)
Immature Grans (Abs): 0 10*3/uL (ref 0.0–0.1)
Immature Granulocytes: 0 %
Lymphocytes Absolute: 1.4 10*3/uL (ref 0.7–3.1)
Lymphs: 25 %
MCH: 30.7 pg (ref 26.6–33.0)
MCHC: 33.7 g/dL (ref 31.5–35.7)
MCV: 91 fL (ref 79–97)
Monocytes Absolute: 0.5 10*3/uL (ref 0.1–0.9)
Monocytes: 9 %
Neutrophils Absolute: 3.7 10*3/uL (ref 1.4–7.0)
Neutrophils: 63 %
Platelets: 171 10*3/uL (ref 150–450)
RBC: 4.76 x10E6/uL (ref 4.14–5.80)
RDW: 12.7 % (ref 11.6–15.4)
WBC: 5.8 10*3/uL (ref 3.4–10.8)

## 2022-04-20 LAB — TSH: TSH: 1.5 u[IU]/mL (ref 0.450–4.500)

## 2022-04-20 LAB — LIPID PANEL
Chol/HDL Ratio: 2.7 ratio (ref 0.0–5.0)
Cholesterol, Total: 124 mg/dL (ref 100–199)
HDL: 46 mg/dL (ref >39)
LDL Chol Calc (NIH): 60 mg/dL (ref 0–99)
Triglycerides: 96 mg/dL (ref 0–149)
VLDL Cholesterol Cal: 18 mg/dL (ref 5–40)

## 2022-04-20 LAB — MICROALBUMIN / CREATININE URINE RATIO
Creatinine, Urine: 81.3 mg/dL
Microalb/Creat Ratio: 16 mg/g creat (ref 0–29)
Microalbumin, Urine: 13.4 ug/mL

## 2022-04-20 LAB — PSA: Prostate Specific Ag, Serum: 0.1 ng/mL (ref 0.0–4.0)

## 2022-04-26 ENCOUNTER — Telehealth: Payer: Self-pay

## 2022-04-26 NOTE — Telephone Encounter (Signed)
Pt notified of lab results and would like to hold off on Jardiance. Pt states that he started his Actos medication again and would like to try that and recheck in 3 months. Will notifiy MD.

## 2022-04-26 NOTE — Telephone Encounter (Signed)
-----   Message from Crist Fat, MD sent at 04/24/2022 12:01 PM EDT ----- His diabetes is borderline.  I want him to start jardiance  daily.  His other labs look good.

## 2022-05-08 DIAGNOSIS — Z6835 Body mass index (BMI) 35.0-35.9, adult: Secondary | ICD-10-CM | POA: Diagnosis not present

## 2022-05-08 DIAGNOSIS — M48062 Spinal stenosis, lumbar region with neurogenic claudication: Secondary | ICD-10-CM | POA: Diagnosis not present

## 2022-07-18 ENCOUNTER — Ambulatory Visit: Payer: PPO | Admitting: Internal Medicine

## 2022-07-25 ENCOUNTER — Encounter: Payer: Self-pay | Admitting: Internal Medicine

## 2022-07-25 ENCOUNTER — Ambulatory Visit: Payer: PPO | Admitting: Internal Medicine

## 2022-07-25 VITALS — BP 128/88 | HR 69 | Temp 98.0°F | Resp 18 | Ht 66.0 in | Wt 241.4 lb

## 2022-07-25 DIAGNOSIS — E119 Type 2 diabetes mellitus without complications: Secondary | ICD-10-CM | POA: Diagnosis not present

## 2022-07-25 NOTE — Progress Notes (Signed)
Office Visit  Subjective   Patient ID: Albert Benjamin   DOB: 07-Jan-1951   Age: 72 y.o.   MRN: [id number]   Chief Complaint Chief Complaint  Patient presents with   3 Month Follow Up   Hypertension   Diabetes     History of Present Illness The patient is a 72 year old Caucasian/White male who returns for a follow-up visit for his T2 diabetes.  On his last visit, his A1c was 7% so we started him on jardiance.  However, the patient states he had to stop this due to it causing nausea.  Also this past year, his HgBA1c was not controlled and I asked him to increased his actos back to 30mg  daily.  The patient was placed on a trial off actos this past year due to him losing weight by diet and exercise.  He had lost over 36 lbs over that time.  We had previously cut back his actos from 30mg  to 15mg  daily but again stopped his actos.  We had to restart actos where his diabetes subsequently became uncontrolled.  This past year his diabetes was still not controlled and we started him on mounjaro but he had to stop this due to nausea and vomiting and we restarted him back on actos.  His current medications include:  Actos 30mg  daily.  He specifically denies unexplained abdominal pain, nausea, vomiting, or documented hypoglycemia.  He does not routinely check his blood sugars. He came in fasting today in anticipation of lab work. His last HgBA1c was done 3 months ago and was 7%. There is no diabetic complications of diabetic neuropathy, nephropathy, retinopathy or cardiovascular disease.  His last dilated eye exam was done by Dr. Precious Bard on 05/15/2021 and this showed no evidence of diabetic retinopathy.   He has a repeat visit with optometry on 08/29/2022 to have his yearly eye exam.       Past Medical History Past Medical History:  Diagnosis Date   Allergic rhinitis    Aphakia, left eye 02/03/2020   Secondary insertion of posterior chamber intraocular lens via Yamani scleral tunnel technique 02/02/2020 left  eye   Arthritis    Cataract    bilkateral surgery 3-40 years ago   Chronic pain syndrome    Depression    Erectile dysfunction    GAD (generalized anxiety disorder)    Hyperlipidemia    Hypertension    Lumbar stenosis with neurogenic claudication    Obstructive sleep apnea on CPAP    T2DM (type 2 diabetes mellitus) (HCC)    Vitreous prolapse, left eye 01/11/2020   Wears glasses    Wears hearing aid    both ears     Allergies Allergies  Allergen Reactions   Flexeril [Cyclobenzaprine] Other (See Comments)    Hallucinations-reported per patient's account     Medications  Current Outpatient Medications:    pioglitazone (ACTOS) 30 MG tablet, Take 1 tablet (30 mg total) by mouth daily., Disp: 90 tablet, Rfl: 3   cetirizine (ZYRTEC) 10 MG tablet, Take 10 mg by mouth daily., Disp: , Rfl:    citalopram (CELEXA) 40 MG tablet, TAKE 1 TABLET BY MOUTH ONCE DAILY, Disp: 90 tablet, Rfl: 1   glucose blood test strip, Use as instructed, Disp: 100 each, Rfl: 12   montelukast (SINGULAIR) 10 MG tablet, Take 1 tablet by mouth once daily in the evening., Disp: 90 tablet, Rfl: 1   pravastatin (PRAVACHOL) 40 MG tablet, Take 1 tablet (40 mg total) by  mouth at bedtime., Disp: 90 tablet, Rfl: 1   Review of Systems Review of Systems  Constitutional:  Negative for chills and fever.  Eyes:  Negative for blurred vision and double vision.  Respiratory:  Negative for shortness of breath.   Cardiovascular:  Negative for chest pain, palpitations and leg swelling.  Gastrointestinal:  Negative for abdominal pain, constipation, diarrhea, nausea and vomiting.  Genitourinary:  Negative for frequency.  Neurological:  Negative for dizziness, weakness and headaches.  Endo/Heme/Allergies:  Negative for polydipsia.       Objective:    Vitals BP 128/88 (BP Location: Left Arm, Patient Position: Sitting)   Pulse 69   Temp 98 F (36.7 C)   Resp 18   Ht 5\' 6"  (1.676 m)   Wt 241 lb 6 oz (109.5 kg)   SpO2 97%    BMI 38.96 kg/m    Physical Examination Physical Exam Constitutional:      Appearance: Normal appearance. He is not ill-appearing.  Cardiovascular:     Rate and Rhythm: Normal rate and regular rhythm.     Pulses: Normal pulses.     Heart sounds: No murmur heard.    No friction rub. No gallop.  Pulmonary:     Effort: Pulmonary effort is normal. No respiratory distress.     Breath sounds: No wheezing, rhonchi or rales.  Abdominal:     General: Abdomen is flat. Bowel sounds are normal. There is no distension.     Palpations: Abdomen is soft.     Tenderness: There is no abdominal tenderness.  Musculoskeletal:     Right lower leg: No edema.     Left lower leg: No edema.  Skin:    General: Skin is warm and dry.     Findings: No rash.  Neurological:     Mental Status: He is alert.        Assessment & Plan:   T2 Diabetes mellitus without complication We will recheck his HgBA1c.  He has gained weight back and is trying to eat and exercise.  We will give further directions once his labs are back.    No follow-ups on file.   Crist Fat, MD

## 2022-07-25 NOTE — Assessment & Plan Note (Signed)
We will recheck his HgBA1c.  He has gained weight back and is trying to eat and exercise.  We will give further directions once his labs are back.

## 2022-07-26 LAB — BASIC METABOLIC PANEL
BUN/Creatinine Ratio: 28 — ABNORMAL HIGH (ref 10–24)
BUN: 27 mg/dL (ref 8–27)
CO2: 23 mmol/L (ref 20–29)
Calcium: 9.4 mg/dL (ref 8.6–10.2)
Chloride: 102 mmol/L (ref 96–106)
Creatinine, Ser: 0.97 mg/dL (ref 0.76–1.27)
Glucose: 111 mg/dL — ABNORMAL HIGH (ref 70–99)
Potassium: 5.3 mmol/L — ABNORMAL HIGH (ref 3.5–5.2)
Sodium: 142 mmol/L (ref 134–144)
eGFR: 82 mL/min/{1.73_m2} (ref >59)

## 2022-07-26 LAB — HEMOGLOBIN A1C
Est. average glucose Bld gHb Est-mCnc: 148 mg/dL
Hgb A1c MFr Bld: 6.8 % — ABNORMAL HIGH (ref 4.8–5.6)

## 2022-08-05 ENCOUNTER — Telehealth: Payer: Self-pay

## 2022-08-05 NOTE — Telephone Encounter (Signed)
-----   Message from Michel Santee Eyk sent at 08/04/2022  7:33 PM EDT ----- His labs look good and his diabetes is controlled.

## 2022-08-05 NOTE — Telephone Encounter (Signed)
Pt notified of lab results

## 2022-09-24 ENCOUNTER — Other Ambulatory Visit: Payer: Self-pay | Admitting: Internal Medicine

## 2022-09-25 ENCOUNTER — Other Ambulatory Visit: Payer: Self-pay | Admitting: Physician Assistant

## 2022-10-25 ENCOUNTER — Ambulatory Visit: Payer: PPO | Admitting: Internal Medicine

## 2022-10-25 ENCOUNTER — Encounter: Payer: Self-pay | Admitting: Internal Medicine

## 2022-10-25 VITALS — BP 132/78 | HR 74 | Temp 98.7°F | Resp 18 | Ht 66.0 in | Wt 240.2 lb

## 2022-10-25 DIAGNOSIS — E119 Type 2 diabetes mellitus without complications: Secondary | ICD-10-CM

## 2022-10-25 DIAGNOSIS — I10 Essential (primary) hypertension: Secondary | ICD-10-CM

## 2022-10-25 DIAGNOSIS — E7800 Pure hypercholesterolemia, unspecified: Secondary | ICD-10-CM

## 2022-10-25 NOTE — Assessment & Plan Note (Signed)
We will recheck his cholesterol today.  He remains on pravastatin.

## 2022-10-25 NOTE — Progress Notes (Unsigned)
Office Visit  Subjective   Patient ID: Albert Benjamin   DOB: 12/17/1950   Age: 72 y.o.   MRN: [id number]   Chief Complaint Chief Complaint  Patient presents with   Follow-up     History of Present Illness The patient is a 72 year old Caucasian/White male who returns for a follow-up visit for his T2 diabetes.  Since his last visit, he has not had any problems.  This past year, his A1c was borderline so started him on jardiance.  However, the patient states he had to stop this due to it causing nausea.  Also this past year, his HgBA1c was not controlled and I asked him to increased his actos back to 30mg  daily.  The patient was placed on a trial off actos this past year due to him losing weight by diet and exercise.  He had lost over 36 lbs over that time.  We had previously cut back his actos from 30mg  to 15mg  daily but again stopped his actos.  We had to restart actos where his diabetes subsequently became uncontrolled.  This past year his diabetes was still not controlled and we started him on mounjaro but he had to stop this due to nausea and vomiting and we restarted him back on actos.  His current medications include:  Actos 30mg  daily.  He specifically denies unexplained abdominal pain, nausea, vomiting, or documented hypoglycemia.  He does not routinely check his blood sugars. He came in fasting today in anticipation of lab work. His last HgBA1c was done 3 months ago and was 6.8%. There is no diabetic complications of diabetic neuropathy, nephropathy, retinopathy or cardiovascular disease.   He has a repeat visit with optometry on 08/29/2022 to have his yearly eye exam but I do not have this report..  The patient is a 72 year old Caucasian/White male who presents for a follow-up evaluation of hypertension.  Since his last visit, he has not had any problems.   He was on lisinopril but he stopped it in the past because his BP was "normal" at home.  The patient has  been checking his blood pressure  at home with systolic BP running in the 120-130's.  He is supposed to be on lisinopril for kidney protection but during his surgeries his BP was low so he stopped it. He is not on any blood pressure medicines at this time.  The patient denies any headache, visual changes, dizziness, lightheadness, chest pain, shortness of breath, weakness/numbness, and edema. He reports there have been no other symptoms noted.  [name] returns today for routine followup on his cholesterol. Overall, he states he is doing well and is without any complaints or problems at this time. He specifically denies abdominal pain, nausea, vomiting, diarrhea, myalgias, and fatigue. He remains on dietary management as well as pravastatin 40mg  po daily. He is fasting in anticipation for labs today.     Past Medical History Past Medical History:  Diagnosis Date   Allergic rhinitis    Aphakia, left eye 02/03/2020   Secondary insertion of posterior chamber intraocular lens via Yamani scleral tunnel technique 02/02/2020 left eye   Arthritis    Cataract    bilkateral surgery 3-40 years ago   Chronic pain syndrome    Depression    Erectile dysfunction    GAD (generalized anxiety disorder)    Hyperlipidemia    Hypertension    Lumbar stenosis with neurogenic claudication    Obstructive sleep apnea on  CPAP    T2DM (type 2 diabetes mellitus) (HCC)    Vitreous prolapse, left eye 01/11/2020   Wears glasses    Wears hearing aid    both ears     Allergies Allergies  Allergen Reactions   Flexeril [Cyclobenzaprine] Other (See Comments)    Hallucinations-reported per patient's account     Medications  Current Outpatient Medications:    pioglitazone (ACTOS) 30 MG tablet, Take 1 tablet (30 mg total) by mouth daily., Disp: 90 tablet, Rfl: 3   cetirizine (ZYRTEC) 10 MG tablet, Take 10 mg by mouth daily., Disp: , Rfl:    citalopram (CELEXA) 40 MG tablet, TAKE 1 TABLET BY MOUTH ONCE DAILY, Disp: 90 tablet, Rfl: 1    glucose blood test strip, Use as instructed, Disp: 100 each, Rfl: 12   montelukast (SINGULAIR) 10 MG tablet, Take 1 tablet by mouth once daily in the evening., Disp: 90 tablet, Rfl: 1   pravastatin (PRAVACHOL) 40 MG tablet, Take 1 tablet (40 mg total) by mouth at bedtime., Disp: 90 tablet, Rfl: 1   Review of Systems Review of Systems  Constitutional:  Negative for chills and fever.  Eyes:  Negative for blurred vision and double vision.  Respiratory:  Negative for cough and shortness of breath.   Cardiovascular:  Negative for chest pain, palpitations and leg swelling.  Gastrointestinal:  Negative for abdominal pain, constipation, diarrhea, nausea and vomiting.  Genitourinary:  Negative for frequency.  Skin:  Negative for itching and rash.  Neurological:  Negative for dizziness, weakness and headaches.  Endo/Heme/Allergies:  Negative for polydipsia.       Objective:    Vitals BP 132/78   Pulse 74   Temp 98.7 F (37.1 C)   Resp 18   Ht 5\' 6"  (1.676 m)   Wt 240 lb 3.2 oz (109 kg)   SpO2 99%   BMI 38.77 kg/m    Physical Examination Physical Exam Constitutional:      Appearance: Normal appearance. He is not ill-appearing.  Cardiovascular:     Rate and Rhythm: Normal rate and regular rhythm.     Pulses: Normal pulses.     Heart sounds: No murmur heard.    No friction rub. No gallop.  Pulmonary:     Effort: Pulmonary effort is normal. No respiratory distress.     Breath sounds: No wheezing, rhonchi or rales.  Abdominal:     General: Abdomen is flat. Bowel sounds are normal. There is no distension.     Palpations: Abdomen is soft.     Tenderness: There is no abdominal tenderness.  Musculoskeletal:     Right lower leg: No edema.     Left lower leg: No edema.  Skin:    General: Skin is warm and dry.     Findings: No rash.  Neurological:     General: No focal deficit present.     Mental Status: He is alert and oriented to person, place, and time.  Psychiatric:         Mood and Affect: Mood normal.        Behavior: Behavior normal.        Assessment & Plan:   Essential hypertension He is not on any medications and he stopped his previous lisinopril.   We will continue to monitor.  T2 Diabetes mellitus without complication We will check his HgBa1c today.  I need his eye report from Dr. Precious Bard in optometry.  Hypercholesterolemia We will recheck his cholesterol today.  He remains  on pravastatin.    Return in about 3 months (around 01/25/2023).   Crist Fat, MD

## 2022-10-25 NOTE — Assessment & Plan Note (Signed)
He is not on any medications and he stopped his previous lisinopril.   We will continue to monitor.

## 2022-10-25 NOTE — Assessment & Plan Note (Signed)
We will check his HgBa1c today.  I need his eye report from Dr. Precious Bard in optometry.

## 2022-10-26 LAB — LIPID PANEL
Chol/HDL Ratio: 2.8 {ratio} (ref 0.0–5.0)
Cholesterol, Total: 137 mg/dL (ref 100–199)
HDL: 49 mg/dL (ref >39)
LDL Chol Calc (NIH): 73 mg/dL (ref 0–99)
Triglycerides: 73 mg/dL (ref 0–149)
VLDL Cholesterol Cal: 15 mg/dL (ref 5–40)

## 2022-10-26 LAB — CMP14 + ANION GAP
ALT: 16 [IU]/L (ref 0–44)
AST: 18 [IU]/L (ref 0–40)
Albumin: 4.3 g/dL (ref 3.8–4.8)
Alkaline Phosphatase: 77 [IU]/L (ref 44–121)
Anion Gap: 13 mmol/L (ref 10.0–18.0)
BUN/Creatinine Ratio: 21 (ref 10–24)
BUN: 24 mg/dL (ref 8–27)
Bilirubin Total: 0.6 mg/dL (ref 0.0–1.2)
CO2: 24 mmol/L (ref 20–29)
Calcium: 9.6 mg/dL (ref 8.6–10.2)
Chloride: 107 mmol/L — ABNORMAL HIGH (ref 96–106)
Creatinine, Ser: 1.12 mg/dL (ref 0.76–1.27)
Globulin, Total: 2.5 g/dL (ref 1.5–4.5)
Glucose: 124 mg/dL — ABNORMAL HIGH (ref 70–99)
Potassium: 5.5 mmol/L — ABNORMAL HIGH (ref 3.5–5.2)
Sodium: 144 mmol/L (ref 134–144)
Total Protein: 6.8 g/dL (ref 6.0–8.5)
eGFR: 69 mL/min/{1.73_m2} (ref >59)

## 2022-10-26 LAB — HEMOGLOBIN A1C
Est. average glucose Bld gHb Est-mCnc: 146 mg/dL
Hgb A1c MFr Bld: 6.7 % — ABNORMAL HIGH (ref 4.8–5.6)

## 2022-10-31 NOTE — Progress Notes (Signed)
He continues to have some mild hyperkalemia. His labs look good. I want him to start on hydrochlorothiazide 12.5mg  po daily.  Patient aware of results

## 2022-11-01 ENCOUNTER — Other Ambulatory Visit: Payer: Self-pay

## 2022-11-01 MED ORDER — HYDROCHLOROTHIAZIDE 12.5 MG PO TABS: 12.5000 mg | ORAL_TABLET | Freq: Every day | ORAL | 1 refills | Status: DC

## 2022-11-01 NOTE — Progress Notes (Signed)
New Rx

## 2022-11-13 ENCOUNTER — Other Ambulatory Visit: Payer: Self-pay | Admitting: Neurosurgery

## 2022-11-13 DIAGNOSIS — M5416 Radiculopathy, lumbar region: Secondary | ICD-10-CM

## 2022-11-13 DIAGNOSIS — Z6835 Body mass index (BMI) 35.0-35.9, adult: Secondary | ICD-10-CM | POA: Diagnosis not present

## 2022-11-24 ENCOUNTER — Ambulatory Visit
Admission: RE | Admit: 2022-11-24 | Discharge: 2022-11-24 | Disposition: A | Discharge status: Home / Self Care | Payer: PPO | Source: Ambulatory Visit | Attending: Neurosurgery | Admitting: Neurosurgery

## 2022-11-24 DIAGNOSIS — M47816 Spondylosis without myelopathy or radiculopathy, lumbar region: Secondary | ICD-10-CM | POA: Diagnosis not present

## 2022-11-24 DIAGNOSIS — M5126 Other intervertebral disc displacement, lumbar region: Secondary | ICD-10-CM | POA: Diagnosis not present

## 2022-11-24 DIAGNOSIS — M5416 Radiculopathy, lumbar region: Secondary | ICD-10-CM

## 2022-12-11 ENCOUNTER — Other Ambulatory Visit: Payer: Self-pay | Admitting: Physician Assistant

## 2022-12-11 DIAGNOSIS — M5416 Radiculopathy, lumbar region: Secondary | ICD-10-CM | POA: Diagnosis not present

## 2022-12-11 DIAGNOSIS — M4310 Spondylolisthesis, site unspecified: Secondary | ICD-10-CM | POA: Diagnosis not present

## 2022-12-11 DIAGNOSIS — Z6836 Body mass index (BMI) 36.0-36.9, adult: Secondary | ICD-10-CM | POA: Diagnosis not present

## 2022-12-24 ENCOUNTER — Other Ambulatory Visit: Payer: Self-pay | Admitting: Internal Medicine

## 2022-12-26 DIAGNOSIS — M5416 Radiculopathy, lumbar region: Secondary | ICD-10-CM | POA: Diagnosis not present

## 2022-12-26 DIAGNOSIS — Z9889 Other specified postprocedural states: Secondary | ICD-10-CM | POA: Diagnosis not present

## 2023-01-06 ENCOUNTER — Ambulatory Visit: Payer: PPO | Admitting: Internal Medicine

## 2023-01-06 ENCOUNTER — Encounter: Payer: Self-pay | Admitting: Internal Medicine

## 2023-01-06 VITALS — BP 138/86 | HR 73 | Temp 98.6°F | Resp 18 | Ht 69.0 in | Wt 250.2 lb

## 2023-01-06 DIAGNOSIS — F411 Generalized anxiety disorder: Secondary | ICD-10-CM

## 2023-01-06 DIAGNOSIS — M48062 Spinal stenosis, lumbar region with neurogenic claudication: Secondary | ICD-10-CM | POA: Diagnosis not present

## 2023-01-06 MED ORDER — CLONAZEPAM 0.5 MG PO TABS: 0.5000 mg | ORAL_TABLET | Freq: Every day | ORAL | 2 refills | Status: DC | PRN

## 2023-01-06 NOTE — Assessment & Plan Note (Signed)
He underwent decompressive laminectomies in the past. He may need another ESI in 01/2023 and his surgeon is looking to do possible spinal surgery.

## 2023-01-06 NOTE — Progress Notes (Signed)
Office Visit  Subjective   Patient ID: Albert Benjamin   DOB: 03-25-1950   Age: 72 y.o.   MRN: [id number]   Chief Complaint Chief Complaint  Patient presents with   Acute Visit    Anxiety     History of Present Illness Albert Benjamin returns today for an acute visit for worsening anxiety and possible depression.  Albert Benjamin states his symptoms started worsening last month but Albert Benjamin had an ESI around 12/26/2022 and Albert Benjamin feels his anxiety has worsened.  Albert Benjamin denies any panic attacks.  The patient has been on celexa 40mg  daily for years and his anxiety has been stable.  Albert Benjamin started using his clonazepam again about 1 month ago where Albert Benjamin states Albert Benjamin gets a "knotted feeling in his stomach" and the clonazepam helps with it but Albert Benjamin has been using about 1/2 per day to help.  Albert Benjamin wonders also if Albert Benjamin has depression.  Albert Benjamin states Albert Benjamin has no insomnia but has problems with concentration, slight decrease in appetite.  Albert Benjamin denies any fatigue, feelings of worthless, helpless feeling, feelings of guilt, no weight loss, insomnia and no SI/HI.  His anxiety has his anxiety is mild and controlled and Albert Benjamin denies any depression.     Past Medical History Past Medical History:  Diagnosis Date   Allergic rhinitis    Aphakia, left eye 02/03/2020   Secondary insertion of posterior chamber intraocular lens via Yamani scleral tunnel technique 02/02/2020 left eye   Arthritis    Cataract    bilkateral surgery 3-40 years ago   Chronic pain syndrome    Depression    Erectile dysfunction    GAD (generalized anxiety disorder)    Hyperlipidemia    Hypertension    Lumbar stenosis with neurogenic claudication    Obstructive sleep apnea on CPAP    T2DM (type 2 diabetes mellitus) (HCC)    Vitreous prolapse, left eye 01/11/2020   Wears glasses    Wears hearing aid    both ears     Allergies Allergies  Allergen Reactions   Flexeril [Cyclobenzaprine] Other (See Comments)    Hallucinations-reported per patient's account     Medications  Current  Outpatient Medications:    pioglitazone (ACTOS) 30 MG tablet, Take 1 tablet (30 mg total) by mouth daily., Disp: 90 tablet, Rfl: 3   cetirizine (ZYRTEC) 10 MG tablet, Take 10 mg by mouth daily., Disp: , Rfl:    citalopram (CELEXA) 40 MG tablet, TAKE 1 TABLET BY MOUTH ONCE DAILY, Disp: 90 tablet, Rfl: 1   glucose blood test strip, Use as instructed, Disp: 100 each, Rfl: 12   hydrochlorothiazide (MICROZIDE) 12.5 MG capsule, Take 1 capsule by mouth once daily., Disp: 30 capsule, Rfl: 1   montelukast (SINGULAIR) 10 MG tablet, Take 1 tablet by mouth once daily in the evening., Disp: 90 tablet, Rfl: 1   pravastatin (PRAVACHOL) 40 MG tablet, Take 1 tablet (40 mg total) by mouth at bedtime., Disp: 90 tablet, Rfl: 1   Review of Systems Review of Systems  Constitutional:  Negative for chills and fever.  Respiratory:  Negative for shortness of breath.   Cardiovascular:  Negative for chest pain.  Gastrointestinal:  Negative for constipation, diarrhea, nausea and vomiting.  Neurological:  Negative for dizziness, weakness and headaches.       Objective:    Vitals BP 138/86   Pulse 73   Temp 98.6 F (37 C)   Resp 18   Ht 5\' 9"  (1.753 m)   Wt 250  lb 3.2 oz (113.5 kg)   SpO2 98%   BMI 36.95 kg/m    Physical Examination Physical Exam Constitutional:      Appearance: Normal appearance. Albert Benjamin is not ill-appearing.  Cardiovascular:     Rate and Rhythm: Normal rate and regular rhythm.     Pulses: Normal pulses.     Heart sounds: No murmur heard.    No friction rub. No gallop.  Pulmonary:     Effort: Pulmonary effort is normal. No respiratory distress.     Breath sounds: No wheezing, rhonchi or rales.  Abdominal:     General: Abdomen is flat. Bowel sounds are normal. There is no distension.     Palpations: Abdomen is soft.     Tenderness: There is no abdominal tenderness.  Musculoskeletal:     Right lower leg: No edema.     Left lower leg: No edema.  Skin:    General: Skin is warm and dry.      Findings: No rash.  Neurological:     Mental Status: Albert Benjamin is alert.  Psychiatric:        Mood and Affect: Mood normal.        Behavior: Behavior normal.        Assessment & Plan:   Lumbar stenosis with neurogenic claudication Albert Benjamin underwent decompressive laminectomies in the past. Albert Benjamin may need another ESI in 01/2023 and his surgeon is looking to do possible spinal surgery.  GAD (generalized anxiety disorder) I believe his ESI has made his GAD worsened since Albert Benjamin states that since the injection, his anxiety has done a lot worse.  I am not going to change his celexa and Albert Benjamin can use scheduled clonazepam over the next month.    No follow-ups on file.   Crist Fat, MD

## 2023-01-06 NOTE — Assessment & Plan Note (Signed)
I believe his ESI has made his GAD worsened since he states that since the injection, his anxiety has done a lot worse.  I am not going to change his celexa and he can use scheduled clonazepam over the next month.

## 2023-01-22 DIAGNOSIS — H43311 Vitreous membranes and strands, right eye: Secondary | ICD-10-CM | POA: Diagnosis not present

## 2023-01-22 DIAGNOSIS — T8529XA Other mechanical complication of intraocular lens, initial encounter: Secondary | ICD-10-CM | POA: Diagnosis not present

## 2023-01-22 DIAGNOSIS — H43391 Other vitreous opacities, right eye: Secondary | ICD-10-CM | POA: Diagnosis not present

## 2023-01-23 DIAGNOSIS — M5416 Radiculopathy, lumbar region: Secondary | ICD-10-CM | POA: Diagnosis not present

## 2023-01-23 DIAGNOSIS — M4310 Spondylolisthesis, site unspecified: Secondary | ICD-10-CM | POA: Diagnosis not present

## 2023-01-23 DIAGNOSIS — Z6837 Body mass index (BMI) 37.0-37.9, adult: Secondary | ICD-10-CM | POA: Diagnosis not present

## 2023-01-24 ENCOUNTER — Other Ambulatory Visit: Payer: Self-pay | Admitting: Neurosurgery

## 2023-01-27 ENCOUNTER — Other Ambulatory Visit: Payer: Self-pay | Admitting: Internal Medicine

## 2023-01-28 ENCOUNTER — Encounter: Payer: Self-pay | Admitting: Internal Medicine

## 2023-01-28 ENCOUNTER — Ambulatory Visit: Payer: PPO | Admitting: Internal Medicine

## 2023-01-28 VITALS — BP 134/84 | HR 74 | Temp 98.6°F | Resp 17 | Ht 66.0 in | Wt 251.6 lb

## 2023-01-28 DIAGNOSIS — F411 Generalized anxiety disorder: Secondary | ICD-10-CM

## 2023-01-28 DIAGNOSIS — M48062 Spinal stenosis, lumbar region with neurogenic claudication: Secondary | ICD-10-CM

## 2023-01-28 DIAGNOSIS — I10 Essential (primary) hypertension: Secondary | ICD-10-CM

## 2023-01-28 DIAGNOSIS — E113292 Type 2 diabetes mellitus with mild nonproliferative diabetic retinopathy without macular edema, left eye: Secondary | ICD-10-CM | POA: Diagnosis not present

## 2023-01-28 NOTE — Assessment & Plan Note (Signed)
Dr. Precious Bard noted some mild diabetic retinopathy in his left eye.  We will continue on his Actos with goal HbA1c <7%.  I want him to eat healthy and exercise as he is able with his back pain.

## 2023-01-28 NOTE — Progress Notes (Signed)
Office Visit  Subjective   Patient ID: Albert Benjamin   DOB: 1950-02-04   Age: 73 y.o.   MRN: [id number]   Chief Complaint Chief Complaint  Patient presents with   Follow-up     History of Present Illness Albert Benjamin returns today for followup of his anxiety disorder.  I saw him last month where he had worsening anxiety which I felt was due to his recent ESI injection.  We did not did not change his celexa but I wrote him for clonazepam scheduled.  Over the interim, he states his anxiety improved once the steriod shot wore off and he stopped using the clonazepam after about a week.  Today, his anxiety is mmild and controlled.  Again, he has worsening in Dec when had an ESI around 12/26/2022.   He denies any panic attacks.  The patient has been on celexa 40mg  daily for years and his anxiety has been stable.  HHe states he has no insomnia but has problems with concentration, slight decrease in appetite.  He denies any fatigue, feelings of worthless, helpless feeling, feelings of guilt, no weight loss, insomnia and no SI/HI.  His anxiety has his anxiety is mild and controlled and he denies any depression.  He is also followed by Dr. Dutch Quint in neurosurgery for back pain where he has lumbar stenosis with neurogenic claudication.  He did followup with them on 01/23/2023 and he told them that the ESI injection wore off after about 2 weeks.  He has both weakness/numbness in his left leg. He has underwent decompressive laminectomies in the past. They are planning to do a a L3-L4 Laminectomy and Foraminotomy - L2-L3 - L3-L4 - L4-L5 - L5-S1 with Possible  - L4-L5 microdiscetomy and  - L5-S1 microdiscectomy on 02/14/2023.  The patient is a 73 year old Caucasian/White male who returns for a follow-up visit for his T2 diabetes.  Since his last visit, he has not had any problems.  This past year, his A1c was borderline so started him on jardiance.  However, the patient states he had to stop this due to it causing  nausea.  Also this past year, his HgBA1c was not controlled and I asked him to increased his actos back to 30mg  daily.  The patient was placed on a trial off actos this past year due to him losing weight by diet and exercise.  He had lost over 36 lbs over that time.  We had previously cut back his actos from 30mg  to 15mg  daily but again stopped his actos.  We had to restart actos where his diabetes subsequently became uncontrolled.  This past year his diabetes was still not controlled and we started him on mounjaro but he had to stop this due to nausea and vomiting and we restarted him back on actos.  His current medications include:  Actos 30mg  daily.  He specifically denies unexplained abdominal pain, nausea, vomiting, or documented hypoglycemia.  He does not routinely check his blood sugars. He came in fasting today in anticipation of lab work. His last HgBA1c was done 3 months ago and was 6.7%. There is no diabetic complications of diabetic neuropathy, nephropathy, retinopathy or cardiovascular disease.   He had a dilated diabetic eye exam by Dr. Precious Bard on 09/05/2022 and this showed mild non-proliferative diabetic retinopathy of his left eye without edema.   The patient is a 73 year old Caucasian/White male who presents for a follow-up evaluation of hypertension.  On his last routine visit,  his K+ level was elevated and we did start him on hydrochlorothiazide.  He was previously on lisinopril but he stopped it in the past because his BP was "normal" at home.  The patient has not been checking his blood pressure at home.  He is supposed to be on lisinopril for kidney protection but during his surgeries his BP was low so he stopped it. He is currently on:  hydrochlorothiazide 12.5mg  daily.  The patient denies any headache, visual changes, dizziness, lightheadness, chest pain, shortness of breath, weakness/numbness, and edema. He reports there have been no other symptoms noted.       Past Medical  History Past Medical History:  Diagnosis Date   Allergic rhinitis    Aphakia, left eye 02/03/2020   Secondary insertion of posterior chamber intraocular lens via Yamani scleral tunnel technique 02/02/2020 left eye   Arthritis    Cataract    bilkateral surgery 3-40 years ago   Chronic pain syndrome    Depression    Erectile dysfunction    GAD (generalized anxiety disorder)    Hyperlipidemia    Hypertension    Lumbar stenosis with neurogenic claudication    Obstructive sleep apnea on CPAP    T2DM (type 2 diabetes mellitus) (HCC)    Vitreous prolapse, left eye 01/11/2020   Wears glasses    Wears hearing aid    both ears     Allergies Allergies  Allergen Reactions   Flexeril [Cyclobenzaprine] Other (See Comments)    Hallucinations-reported per patient's account     Medications  Current Outpatient Medications:    pioglitazone (ACTOS) 30 MG tablet, Take 1 tablet (30 mg total) by mouth daily., Disp: 90 tablet, Rfl: 3   cetirizine (ZYRTEC) 10 MG tablet, Take 10 mg by mouth daily., Disp: , Rfl:    citalopram (CELEXA) 40 MG tablet, TAKE 1 TABLET BY MOUTH ONCE DAILY, Disp: 90 tablet, Rfl: 1   clonazePAM (KLONOPIN) 0.5 MG tablet, Take 1 tablet (0.5 mg total) by mouth daily as needed for anxiety., Disp: 30 tablet, Rfl: 2   glucose blood test strip, Use as instructed, Disp: 100 each, Rfl: 12   hydrochlorothiazide (MICROZIDE) 12.5 MG capsule, Take 1 capsule by mouth once daily., Disp: 30 capsule, Rfl: 1   montelukast (SINGULAIR) 10 MG tablet, Take 1 tablet by mouth once daily in the evening., Disp: 90 tablet, Rfl: 1   pravastatin (PRAVACHOL) 40 MG tablet, TAKE ONE TABLET BY MOUTH AT BEDTIME, Disp: 90 tablet, Rfl: 1   Review of Systems Review of Systems  Constitutional:  Negative for chills, fever and malaise/fatigue.  Eyes:  Negative for blurred vision and double vision.  Respiratory:  Negative for cough and shortness of breath.   Cardiovascular:  Negative for chest pain, palpitations  and leg swelling.  Gastrointestinal:  Negative for abdominal pain, constipation, diarrhea, nausea and vomiting.  Genitourinary:  Negative for frequency.  Musculoskeletal:  Negative for myalgias.  Skin:  Negative for itching and rash.  Neurological:  Negative for dizziness, weakness and headaches.  Endo/Heme/Allergies:  Negative for polydipsia.       Objective:    Vitals BP 134/84   Pulse 74   Temp 98.6 F (37 C)   Resp 17   Ht 5\' 6"  (1.676 m)   Wt 251 lb 9.6 oz (114.1 kg)   SpO2 99%   BMI 40.61 kg/m    Physical Examination Physical Exam Constitutional:      Appearance: Normal appearance. He is not ill-appearing.  Cardiovascular:  Rate and Rhythm: Normal rate and regular rhythm.     Pulses: Normal pulses.     Heart sounds: No murmur heard.    No friction rub. No gallop.  Pulmonary:     Effort: Pulmonary effort is normal. No respiratory distress.     Breath sounds: No wheezing, rhonchi or rales.  Abdominal:     General: Abdomen is flat. Bowel sounds are normal. There is no distension.     Palpations: Abdomen is soft.     Tenderness: There is no abdominal tenderness.  Musculoskeletal:     Right lower leg: No edema.     Left lower leg: No edema.  Skin:    General: Skin is warm and dry.     Findings: No rash.  Neurological:     General: No focal deficit present.     Mental Status: He is alert and oriented to person, place, and time.  Psychiatric:        Mood and Affect: Mood normal.        Behavior: Behavior normal.        Assessment & Plan:   Essential hypertension His BP is currently doing well.  He is on hydrochlorothiazide but not an ACE-I.  We will recheck his K+ level today.  Mild nonproliferative diabetic retinopathy of left eye without macular edema associated with type 2 diabetes mellitus (HCC) Dr. Precious Bard noted some mild diabetic retinopathy in his left eye.  We will continue on his Actos with goal HbA1c <7%.  I want him to eat healthy and exercise  as he is able with his back pain.  Lumbar stenosis with neurogenic claudication The patient failed his ESI.  They are looking to do surgery on his back next month.  GAD (generalized anxiety disorder) His GAD is now back to baseline where it is mild and controlled.  We will continue to follow.    Return in about 3 months (around 04/28/2023) for annual.   Crist Fat, MD

## 2023-01-28 NOTE — Assessment & Plan Note (Signed)
His GAD is now back to baseline where it is mild and controlled.  We will continue to follow.

## 2023-01-28 NOTE — Assessment & Plan Note (Signed)
The patient failed his ESI.  They are looking to do surgery on his back next month.

## 2023-01-28 NOTE — Assessment & Plan Note (Signed)
His BP is currently doing well.  He is on hydrochlorothiazide but not an ACE-I.  We will recheck his K+ level today.

## 2023-01-29 LAB — BASIC METABOLIC PANEL
BUN/Creatinine Ratio: 23 (ref 10–24)
BUN: 25 mg/dL (ref 8–27)
CO2: 26 mmol/L (ref 20–29)
Calcium: 9.5 mg/dL (ref 8.6–10.2)
Chloride: 104 mmol/L (ref 96–106)
Creatinine, Ser: 1.11 mg/dL (ref 0.76–1.27)
Glucose: 105 mg/dL — ABNORMAL HIGH (ref 70–99)
Potassium: 5.5 mmol/L — ABNORMAL HIGH (ref 3.5–5.2)
Sodium: 143 mmol/L (ref 134–144)
eGFR: 70 mL/min/{1.73_m2} (ref >59)

## 2023-01-29 LAB — HEMOGLOBIN A1C
Est. average glucose Bld gHb Est-mCnc: 154 mg/dL
Hgb A1c MFr Bld: 7 % — ABNORMAL HIGH (ref 4.8–5.6)

## 2023-02-04 NOTE — Progress Notes (Signed)
Tell him his diabetes is a little bit up- watch diet and exercise. His K+ is still elevated- call him in an increased dose of hydrochlorothiazide 25mg  po daily.  Patient is aware of labs, pt admits he has not been taking meds previously, and will start taking. Does not want to go up in mg

## 2023-02-05 DIAGNOSIS — H33321 Round hole, right eye: Secondary | ICD-10-CM | POA: Diagnosis not present

## 2023-02-05 DIAGNOSIS — H43311 Vitreous membranes and strands, right eye: Secondary | ICD-10-CM | POA: Diagnosis not present

## 2023-02-07 NOTE — Progress Notes (Signed)
Surgical Instructions   Your procedure is scheduled on Friday, February 14, 2023. Report to [facility] Main Entrance "A" at 9:30  A.M., then check in with the Admitting office. Any questions or running late day of surgery: call [phone]  Questions prior to your surgery date: call [phone], Monday-Friday, 8am-4pm. If you experience any cold or flu symptoms such as cough, fever, chills, shortness of breath, etc. between now and your scheduled surgery, please notify us at the above number.     Remember:  Do not eat or drink after midnight the night before your surgery   Take these medicines the morning of surgery with A SIP OF WATER  cetirizine (ZYRTEC)  citalopram (CELEXA)  gabapentin (NEURONTIN)  ofloxacin (OCUFLOX)  pioglitazone (ACTOS)  prednisoLONE acetate (PRED FORTE) eye drops   May take these medicines IF NEEDED: clonazePAM (KLONOPIN)    One week prior to surgery, STOP taking any Aspirin (unless otherwise instructed by your surgeon) Aleve, Naproxen, Ibuprofen, Motrin, Advil, Goody's, BC's, all herbal medications, fish oil, and non-prescription vitamins.                     Do NOT Smoke (Tobacco/Vaping) for 24 hours prior to your procedure.  If you use a CPAP at night, you may bring your mask/headgear for your overnight stay.   You will be asked to remove any contacts, glasses, piercing's, hearing aid's, dentures/partials prior to surgery. Please bring cases for these items if needed.    Patients discharged the day of surgery will not be allowed to drive home, and someone needs to stay with them for 24 hours.  SURGICAL WAITING ROOM VISITATION Patients may have no more than 2 support people in the waiting area - these visitors may rotate.   Pre-op nurse will coordinate an appropriate time for 1 ADULT support person, who may not rotate, to accompany patient in pre-op.  Children under the age of 89 must have an adult with them who is not the patient and must remain in  the main waiting area with an adult.  If the patient needs to stay at the hospital during part of their recovery, the visitor guidelines for inpatient rooms apply.  Please refer to the [facility] website for the visitor guidelines for any additional information.   If you received a COVID test during your pre-op visit  it is requested that you wear a mask when out in public, stay away from anyone that may not be feeling well and notify your surgeon if you develop symptoms. If you have been in contact with anyone that has tested positive in the last 10 days please notify you surgeon.      Pre-operative 5 CHG Bathing Instructions   You can play a key role in reducing the risk of infection after surgery. Your skin needs to be as free of germs as possible. You can reduce the number of germs on your skin by washing with CHG (chlorhexidine gluconate) soap before surgery. CHG is an antiseptic soap that kills germs and continues to kill germs even after washing.   DO NOT use if you have an allergy to chlorhexidine/CHG or antibacterial soaps. If your skin becomes reddened or irritated, stop using the CHG and notify one of our RNs at [phone].   Please shower with the CHG soap starting 4 days before surgery using the following schedule:     Please keep in mind the following:  DO NOT shave, including legs and underarms, starting the  day of your first shower.   You may shave your face at any point before/day of surgery.  Place clean sheets on your bed the day you start using CHG soap. Use a clean washcloth (not used since being washed) for each shower. DO NOT sleep with pets once you start using the CHG.   CHG Shower Instructions:  Wash your face and private area with normal soap. If you choose to wash your hair, wash first with your normal shampoo.  After you use shampoo/soap, rinse your hair and body thoroughly to remove shampoo/soap residue.  Turn the water OFF and apply about 3  tablespoons (45 ml) of CHG soap to a CLEAN washcloth.  Apply CHG soap ONLY FROM YOUR NECK DOWN TO YOUR TOES (washing for 3-5 minutes)  DO NOT use CHG soap on face, private areas, open wounds, or sores.  Pay special attention to the area where your surgery is being performed.  If you are having back surgery, having someone wash your back for you may be helpful. Wait 2 minutes after CHG soap is applied, then you may rinse off the CHG soap.  Pat dry with a clean towel  Put on clean clothes/pajamas   If you choose to wear lotion, please use ONLY the CHG-compatible lotions that are listed below.  Additional instructions for the day of surgery: DO NOT APPLY any lotions, deodorants, cologne, or perfumes.   Do not bring valuables to the hospital. [facility] is not responsible for any belongings/valuables. Do not wear nail polish, gel polish, artificial nails, or any other type of covering on natural nails (fingers and toes) Do not wear jewelry or makeup Put on clean/comfortable clothes.  Please brush your teeth.  Ask your nurse before applying any prescription medications to the skin.     CHG Compatible Lotions   Aveeno Moisturizing lotion  Cetaphil Moisturizing Cream  Cetaphil Moisturizing Lotion  Clairol Herbal Essence Moisturizing Lotion, Dry Skin  Clairol Herbal Essence Moisturizing Lotion, Extra Dry Skin  Clairol Herbal Essence Moisturizing Lotion, Normal Skin  Curel Age Defying Therapeutic Moisturizing Lotion with Alpha Hydroxy  Curel Extreme Care Body Lotion  Curel Soothing Hands Moisturizing Hand Lotion  Curel Therapeutic Moisturizing Cream, Fragrance-Free  Curel Therapeutic Moisturizing Lotion, Fragrance-Free  Curel Therapeutic Moisturizing Lotion, Original Formula  Eucerin Daily Replenishing Lotion  Eucerin Dry Skin Therapy Plus Alpha Hydroxy Crme  Eucerin Dry Skin Therapy Plus Alpha Hydroxy Lotion  Eucerin Original Crme  Eucerin Original Lotion  Eucerin Plus Crme  Eucerin Plus Lotion  Eucerin TriLipid Replenishing Lotion  Keri Anti-Bacterial Hand Lotion  Keri Deep Conditioning Original Lotion Dry Skin Formula Softly Scented  Keri Deep Conditioning Original Lotion, Fragrance Free Sensitive Skin Formula  Keri Lotion Fast Absorbing Fragrance Free Sensitive Skin Formula  Keri Lotion Fast Absorbing Softly Scented Dry Skin Formula  Keri Original Lotion  Keri Skin Renewal Lotion Keri Silky Smooth Lotion  Keri Silky Smooth Sensitive Skin Lotion  Nivea Body Creamy Conditioning Oil  Nivea Body Extra Enriched Lotion  Nivea Body Original Lotion  Nivea Body Sheer Moisturizing Lotion Nivea Crme  Nivea Skin Firming Lotion  NutraDerm 30 Skin Lotion  NutraDerm Skin Lotion  NutraDerm Therapeutic Skin Cream  NutraDerm Therapeutic Skin Lotion  ProShield Protective Hand Cream  Provon moisturizing lotion  Please read over the following fact sheets that you were given.

## 2023-02-10 ENCOUNTER — Encounter (HOSPITAL_COMMUNITY): Payer: Self-pay

## 2023-02-10 ENCOUNTER — Other Ambulatory Visit: Payer: Self-pay

## 2023-02-10 ENCOUNTER — Encounter (HOSPITAL_COMMUNITY)
Admission: RE | Admit: 2023-02-10 | Discharge: 2023-02-10 | Disposition: A | Discharge status: Home / Self Care | Payer: PPO | Source: Ambulatory Visit | Attending: Neurosurgery | Admitting: Neurosurgery

## 2023-02-10 VITALS — BP 135/75 | HR 70 | Temp 97.8°F | Resp 20 | Ht 66.0 in | Wt 243.8 lb

## 2023-02-10 DIAGNOSIS — E119 Type 2 diabetes mellitus without complications: Secondary | ICD-10-CM | POA: Insufficient documentation

## 2023-02-10 DIAGNOSIS — Z01818 Encounter for other preprocedural examination: Secondary | ICD-10-CM | POA: Diagnosis not present

## 2023-02-10 LAB — CBC
HCT: 48 % (ref 39.0–52.0)
Hemoglobin: 15.6 g/dL (ref 13.0–17.0)
MCH: 31.1 pg (ref 26.0–34.0)
MCHC: 32.5 g/dL (ref 30.0–36.0)
MCV: 95.6 fL (ref 80.0–100.0)
Platelets: 167 10*3/uL (ref 150–400)
RBC: 5.02 MIL/uL (ref 4.22–5.81)
RDW: 13.1 % (ref 11.5–15.5)
WBC: 6.4 10*3/uL (ref 4.0–10.5)
nRBC: 0 % (ref 0.0–0.2)

## 2023-02-10 LAB — BASIC METABOLIC PANEL
Anion gap: 10 (ref 5–15)
BUN: 30 mg/dL — ABNORMAL HIGH (ref 8–23)
CO2: 28 mmol/L (ref 22–32)
Calcium: 9.7 mg/dL (ref 8.9–10.3)
Chloride: 105 mmol/L (ref 98–111)
Creatinine, Ser: 1.37 mg/dL — ABNORMAL HIGH (ref 0.61–1.24)
GFR, Estimated: 54 mL/min — ABNORMAL LOW (ref >60)
Glucose, Bld: 117 mg/dL — ABNORMAL HIGH (ref 70–99)
Potassium: 5.3 mmol/L — ABNORMAL HIGH (ref 3.5–5.1)
Sodium: 143 mmol/L (ref 135–145)

## 2023-02-10 LAB — TYPE AND SCREEN
ABO/RH(D): A POS
Antibody Screen: NEGATIVE

## 2023-02-10 LAB — SURGICAL PCR SCREEN
MRSA, PCR: NEGATIVE
Staphylococcus aureus: NEGATIVE

## 2023-02-10 LAB — GLUCOSE, CAPILLARY: Glucose-Capillary: 119 mg/dL — ABNORMAL HIGH (ref 70–99)

## 2023-02-10 NOTE — Progress Notes (Signed)
Surgical Instructions   Your procedure is scheduled on Friday, February 14, 2023. Report to [facility] Main Entrance "A" at 9:30  A.M., then check in with the Admitting office. Any questions or running late day of surgery: call [phone]  Questions prior to your surgery date: call [phone], Monday-Friday, 8am-4pm. If you experience any cold or flu symptoms such as cough, fever, chills, shortness of breath, etc. between now and your scheduled surgery, please notify us at the above number.     Remember:  Do not eat or drink after midnight the night before your surgery   Take these medicines the morning of surgery with A SIP OF WATER  cetirizine (ZYRTEC)  citalopram (CELEXA)  gabapentin (NEURONTIN)  ofloxacin (OCUFLOX)  prednisoLONE acetate (PRED FORTE) eye drops   May take these medicines IF NEEDED: clonazePAM (KLONOPIN)    One week prior to surgery, STOP taking any Aspirin (unless otherwise instructed by your surgeon) Aleve, Naproxen, Ibuprofen, Motrin, Advil, Goody's, BC's, all herbal medications, fish oil, and non-prescription vitamins.                    WHAT DO I DO ABOUT MY DIABETES MEDICATION?   Do not take oral diabetes medicines (pills) the morning of surgery. DO NOT TAKE pioglitazone (Actos) the day of surgery.    The day of surgery, do not take other diabetes injectables, including Byetta (exenatide), Bydureon (exenatide ER), Victoza (liraglutide), or Trulicity (dulaglutide).  If your CBG is greater than 220 mg/dL, you may take  of your sliding scale (correction) dose of insulin.   HOW TO MANAGE YOUR DIABETES BEFORE AND AFTER SURGERY  Why is it important to control my blood sugar before and after surgery? Improving blood sugar levels before and after surgery helps healing and can limit problems. A way of improving blood sugar control is eating a healthy diet by:  Eating less sugar and carbohydrates  Increasing activity/exercise  Talking with your doctor  about reaching your blood sugar goals High blood sugars (greater than 180 mg/dL) can raise your risk of infections and slow your recovery, so you will need to focus on controlling your diabetes during the weeks before surgery. Make sure that the doctor who takes care of your diabetes knows about your planned surgery including the date and location.  How do I manage my blood sugar before surgery? Check your blood sugar at least 4 times a day, starting 2 days before surgery, to make sure that the level is not too high or low.  Check your blood sugar the morning of your surgery when you wake up and every 2 hours until you get to the Short Stay unit.  If your blood sugar is less than 70 mg/dL, you will need to treat for low blood sugar: Do not take insulin. Treat a low blood sugar (less than 70 mg/dL) with  cup of clear juice (cranberry or apple), 4 glucose tablets, OR glucose gel. Recheck blood sugar in 15 minutes after treatment (to make sure it is greater than 70 mg/dL). If your blood sugar is not greater than 70 mg/dL on recheck, call [phone] for further instructions. Report your blood sugar to the short stay nurse when you get to Short Stay.  If you are admitted to the hospital after surgery: Your blood sugar will be checked by the staff and you will probably be given insulin after surgery (instead of oral diabetes medicines) to make sure you have good blood sugar levels. The goal for  blood sugar control after surgery is 80-180 mg/dL.  Do NOT Smoke (Tobacco/Vaping) for 24 hours prior to your procedure.  If you use a CPAP at night, you may bring your mask/headgear for your overnight stay.   You will be asked to remove any contacts, glasses, piercing's, hearing aid's, dentures/partials prior to surgery. Please bring cases for these items if needed.    Patients discharged the day of surgery will not be allowed to drive home, and someone needs to stay with them for 24 hours.  SURGICAL  WAITING ROOM VISITATION Patients may have no more than 2 support people in the waiting area - these visitors may rotate.   Pre-op nurse will coordinate an appropriate time for 1 ADULT support person, who may not rotate, to accompany patient in pre-op.  Children under the age of 47 must have an adult with them who is not the patient and must remain in the main waiting area with an adult.  If the patient needs to stay at the hospital during part of their recovery, the visitor guidelines for inpatient rooms apply.  Please refer to the [facility] website for the visitor guidelines for any additional information.   If you received a COVID test during your pre-op visit  it is requested that you wear a mask when out in public, stay away from anyone that may not be feeling well and notify your surgeon if you develop symptoms. If you have been in contact with anyone that has tested positive in the last 10 days please notify you surgeon.      Pre-operative 5 CHG Bathing Instructions   You can play a key role in reducing the risk of infection after surgery. Your skin needs to be as free of germs as possible. You can reduce the number of germs on your skin by washing with CHG (chlorhexidine gluconate) soap before surgery. CHG is an antiseptic soap that kills germs and continues to kill germs even after washing.   DO NOT use if you have an allergy to chlorhexidine/CHG or antibacterial soaps. If your skin becomes reddened or irritated, stop using the CHG and notify one of our RNs at [phone].   Please shower with the CHG soap starting 4 days before surgery using the following schedule:     Please keep in mind the following:  DO NOT shave, including legs and underarms, starting the day of your first shower.   You may shave your face at any point before/day of surgery.  Place clean sheets on your bed the day you start using CHG soap. Use a clean washcloth (not used since being washed) for each  shower. DO NOT sleep with pets once you start using the CHG.   CHG Shower Instructions:  Wash your face and private area with normal soap. If you choose to wash your hair, wash first with your normal shampoo.  After you use shampoo/soap, rinse your hair and body thoroughly to remove shampoo/soap residue.  Turn the water OFF and apply about 3 tablespoons (45 ml) of CHG soap to a CLEAN washcloth.  Apply CHG soap ONLY FROM YOUR NECK DOWN TO YOUR TOES (washing for 3-5 minutes)  DO NOT use CHG soap on face, private areas, open wounds, or sores.  Pay special attention to the area where your surgery is being performed.  If you are having back surgery, having someone wash your back for you may be helpful. Wait 2 minutes after CHG soap is applied, then you may rinse  off the CHG soap.  Pat dry with a clean towel  Put on clean clothes/pajamas   If you choose to wear lotion, please use ONLY the CHG-compatible lotions that are listed below.  Additional instructions for the day of surgery: DO NOT APPLY any lotions, deodorants, cologne, or perfumes.   Do not bring valuables to the hospital. [facility] is not responsible for any belongings/valuables. Do not wear nail polish, gel polish, artificial nails, or any other type of covering on natural nails (fingers and toes) Do not wear jewelry or makeup Put on clean/comfortable clothes.  Please brush your teeth.  Ask your nurse before applying any prescription medications to the skin.     CHG Compatible Lotions   Aveeno Moisturizing lotion  Cetaphil Moisturizing Cream  Cetaphil Moisturizing Lotion  Clairol Herbal Essence Moisturizing Lotion, Dry Skin  Clairol Herbal Essence Moisturizing Lotion, Extra Dry Skin  Clairol Herbal Essence Moisturizing Lotion, Normal Skin  Curel Age Defying Therapeutic Moisturizing Lotion with Alpha Hydroxy  Curel Extreme Care Body Lotion  Curel Soothing Hands Moisturizing Hand Lotion  Curel Therapeutic Moisturizing  Cream, Fragrance-Free  Curel Therapeutic Moisturizing Lotion, Fragrance-Free  Curel Therapeutic Moisturizing Lotion, Original Formula  Eucerin Daily Replenishing Lotion  Eucerin Dry Skin Therapy Plus Alpha Hydroxy Crme  Eucerin Dry Skin Therapy Plus Alpha Hydroxy Lotion  Eucerin Original Crme  Eucerin Original Lotion  Eucerin Plus Crme Eucerin Plus Lotion  Eucerin TriLipid Replenishing Lotion  Keri Anti-Bacterial Hand Lotion  Keri Deep Conditioning Original Lotion Dry Skin Formula Softly Scented  Keri Deep Conditioning Original Lotion, Fragrance Free Sensitive Skin Formula  Keri Lotion Fast Absorbing Fragrance Free Sensitive Skin Formula  Keri Lotion Fast Absorbing Softly Scented Dry Skin Formula  Keri Original Lotion  Keri Skin Renewal Lotion Keri Silky Smooth Lotion  Keri Silky Smooth Sensitive Skin Lotion  Nivea Body Creamy Conditioning Oil  Nivea Body Extra Enriched Lotion  Nivea Body Original Lotion  Nivea Body Sheer Moisturizing Lotion Nivea Crme  Nivea Skin Firming Lotion  NutraDerm 30 Skin Lotion  NutraDerm Skin Lotion  NutraDerm Therapeutic Skin Cream  NutraDerm Therapeutic Skin Lotion  ProShield Protective Hand Cream  Provon moisturizing lotion  Please read over the following fact sheets that you were given.

## 2023-02-10 NOTE — Progress Notes (Signed)
PCP - Michel Santee Eyk Cardiologist - denies cardiologist or any cardiac issues  PPM/ICD - denies   Chest x-ray - denies EKG - 02/10/2023  Stress Test - denies ECHO - denies Cardiac Cath - denies  Sleep Study - pt reports having sleep apnea but no CPAP use    Fasting Blood Sugar - pt states that he checks his CBG monthly/seldomly. CBG 119 today. Pt Last A1c on 01/28/23 7.0.   Last dose of GLP1 agonist-  N/A   Blood Thinner Instructions: N/A Aspirin Instructions:N/A  ERAS Protcol - NPO order   COVID TEST-  N/A   Anesthesia review: Pt potassium 5.5 on 01/29/23. Spoke with APP about re-drawing labs today BMP repeated. Pt was started on hydrochlorothiazide by PCP and has been taking for about a week. Pt denies having HTN currently, but states he has a history of HTN.   Patient denies shortness of breath, fever, cough and chest pain at PAT appointment   All instructions explained to the patient, with a verbal understanding of the material. Patient agrees to go over the instructions while at home for a better understanding.  The opportunity to ask questions was provided.

## 2023-02-11 ENCOUNTER — Other Ambulatory Visit: Payer: Self-pay | Admitting: Neurosurgery

## 2023-02-12 DIAGNOSIS — T8529XA Other mechanical complication of intraocular lens, initial encounter: Secondary | ICD-10-CM | POA: Diagnosis not present

## 2023-02-12 DIAGNOSIS — H43311 Vitreous membranes and strands, right eye: Secondary | ICD-10-CM | POA: Diagnosis not present

## 2023-02-12 DIAGNOSIS — H43391 Other vitreous opacities, right eye: Secondary | ICD-10-CM | POA: Diagnosis not present

## 2023-02-14 ENCOUNTER — Other Ambulatory Visit: Payer: Self-pay

## 2023-02-14 ENCOUNTER — Inpatient Hospital Stay (HOSPITAL_COMMUNITY): Payer: PPO | Admitting: Certified Registered Nurse Anesthetist

## 2023-02-14 ENCOUNTER — Encounter (HOSPITAL_COMMUNITY)
Admission: RE | Disposition: A | Discharge status: Home / Self Care | Payer: Self-pay | Source: Home / Self Care | Attending: Neurosurgery

## 2023-02-14 ENCOUNTER — Inpatient Hospital Stay (HOSPITAL_COMMUNITY): Payer: PPO | Admitting: Vascular Surgery

## 2023-02-14 ENCOUNTER — Inpatient Hospital Stay (HOSPITAL_COMMUNITY)
Admission: RE | Admit: 2023-02-14 | Discharge: 2023-02-15 | DRG: 451 | Disposition: A | Discharge status: Home / Self Care | Payer: PPO | Attending: Neurosurgery | Admitting: Neurosurgery

## 2023-02-14 ENCOUNTER — Inpatient Hospital Stay (HOSPITAL_COMMUNITY): Payer: PPO

## 2023-02-14 ENCOUNTER — Encounter (HOSPITAL_COMMUNITY): Payer: Self-pay | Admitting: Neurosurgery

## 2023-02-14 DIAGNOSIS — Z7984 Long term (current) use of oral hypoglycemic drugs: Secondary | ICD-10-CM

## 2023-02-14 DIAGNOSIS — E785 Hyperlipidemia, unspecified: Secondary | ICD-10-CM | POA: Diagnosis not present

## 2023-02-14 DIAGNOSIS — I10 Essential (primary) hypertension: Secondary | ICD-10-CM | POA: Diagnosis not present

## 2023-02-14 DIAGNOSIS — E66813 Obesity, class 3: Secondary | ICD-10-CM | POA: Diagnosis present

## 2023-02-14 DIAGNOSIS — E119 Type 2 diabetes mellitus without complications: Secondary | ICD-10-CM | POA: Diagnosis not present

## 2023-02-14 DIAGNOSIS — G4733 Obstructive sleep apnea (adult) (pediatric): Secondary | ICD-10-CM | POA: Diagnosis present

## 2023-02-14 DIAGNOSIS — Z981 Arthrodesis status: Secondary | ICD-10-CM | POA: Diagnosis not present

## 2023-02-14 DIAGNOSIS — Z79899 Other long term (current) drug therapy: Secondary | ICD-10-CM | POA: Diagnosis not present

## 2023-02-14 DIAGNOSIS — M4310 Spondylolisthesis, site unspecified: Principal | ICD-10-CM | POA: Diagnosis present

## 2023-02-14 DIAGNOSIS — Z974 Presence of external hearing-aid: Secondary | ICD-10-CM

## 2023-02-14 DIAGNOSIS — M48061 Spinal stenosis, lumbar region without neurogenic claudication: Principal | ICD-10-CM | POA: Diagnosis present

## 2023-02-14 DIAGNOSIS — G894 Chronic pain syndrome: Secondary | ICD-10-CM | POA: Diagnosis present

## 2023-02-14 DIAGNOSIS — Z833 Family history of diabetes mellitus: Secondary | ICD-10-CM | POA: Diagnosis not present

## 2023-02-14 DIAGNOSIS — F411 Generalized anxiety disorder: Secondary | ICD-10-CM | POA: Diagnosis present

## 2023-02-14 DIAGNOSIS — F418 Other specified anxiety disorders: Secondary | ICD-10-CM

## 2023-02-14 DIAGNOSIS — M4316 Spondylolisthesis, lumbar region: Secondary | ICD-10-CM | POA: Diagnosis present

## 2023-02-14 DIAGNOSIS — Z6839 Body mass index (BMI) 39.0-39.9, adult: Secondary | ICD-10-CM | POA: Diagnosis not present

## 2023-02-14 DIAGNOSIS — M961 Postlaminectomy syndrome, not elsewhere classified: Secondary | ICD-10-CM | POA: Diagnosis not present

## 2023-02-14 DIAGNOSIS — M5416 Radiculopathy, lumbar region: Secondary | ICD-10-CM | POA: Diagnosis not present

## 2023-02-14 DIAGNOSIS — Z888 Allergy status to other drugs, medicaments and biological substances status: Secondary | ICD-10-CM | POA: Diagnosis not present

## 2023-02-14 HISTORY — PX: ANTERIOR LATERAL LUMBAR FUSION WITH PERCUTANEOUS SCREW 1 LEVEL: SHX5553

## 2023-02-14 LAB — GLUCOSE, CAPILLARY
Glucose-Capillary: 98 mg/dL (ref 70–99)
Glucose-Capillary: 96 mg/dL (ref 70–99)
Glucose-Capillary: 83 mg/dL (ref 70–99)
Glucose-Capillary: 120 mg/dL — ABNORMAL HIGH (ref 70–99)
Glucose-Capillary: 206 mg/dL — ABNORMAL HIGH (ref 70–99)

## 2023-02-14 LAB — ABO/RH: ABO/RH(D): A POS

## 2023-02-14 SURGERY — ANTERIOR LATERAL LUMBAR FUSION WITH PERCUTANEOUS SCREW 1 LEVEL
Anesthesia: General | Site: Spine Lumbar | Laterality: Left

## 2023-02-14 MED ORDER — PREDNISOLONE ACETATE 1 % OP SUSP
1.0000 [drp] | Freq: Four times a day (QID) | OPHTHALMIC | Status: DC
  Filled 2023-02-14: qty 5

## 2023-02-14 MED ORDER — CHLORHEXIDINE GLUCONATE CLOTH 2 % EX PADS: 6.0000 | MEDICATED_PAD | Freq: Once | CUTANEOUS | Status: DC

## 2023-02-14 MED ORDER — FLEET ENEMA RE ENEM: 1.0000 | ENEMA | Freq: Once | RECTAL | Status: DC | PRN

## 2023-02-14 MED ORDER — HYDROCHLOROTHIAZIDE 12.5 MG PO TABS
12.5000 mg | ORAL_TABLET | Freq: Every day | ORAL | Status: DC
  Administered 2023-02-14: 12.5 mg via ORAL
  Filled 2023-02-14 (×2): qty 1

## 2023-02-14 MED ORDER — PIOGLITAZONE HCL 15 MG PO TABS: 30.0000 mg | ORAL_TABLET | Freq: Every day | ORAL | Status: DC

## 2023-02-14 MED ORDER — THROMBIN 20000 UNITS EX SOLR
CUTANEOUS | Status: DC | PRN
  Administered 2023-02-14: 20 mL via TOPICAL

## 2023-02-14 MED ORDER — HYDROMORPHONE HCL 1 MG/ML IJ SOLN
INTRAMUSCULAR | Status: DC | PRN
  Administered 2023-02-14: .5 mg via INTRAVENOUS

## 2023-02-14 MED ORDER — ROCURONIUM BROMIDE 10 MG/ML (PF) SYRINGE
PREFILLED_SYRINGE | INTRAVENOUS | Status: DC | PRN
  Administered 2023-02-14: 80 mg via INTRAVENOUS

## 2023-02-14 MED ORDER — INSULIN ASPART 100 UNIT/ML IJ SOLN: 0.0000 [IU] | INTRAMUSCULAR | Status: DC | PRN

## 2023-02-14 MED ORDER — CEFAZOLIN SODIUM-DEXTROSE 2-4 GM/100ML-% IV SOLN: 2.0000 g | INTRAVENOUS | Status: DC

## 2023-02-14 MED ORDER — EPHEDRINE SULFATE-NACL 50-0.9 MG/10ML-% IV SOSY
PREFILLED_SYRINGE | INTRAVENOUS | Status: DC | PRN
  Administered 2023-02-14: 10 mg via INTRAVENOUS

## 2023-02-14 MED ORDER — LACTATED RINGERS IV SOLN: INTRAVENOUS | Status: AC

## 2023-02-14 MED ORDER — THROMBIN 20000 UNITS EX SOLR
CUTANEOUS | Status: AC
  Filled 2023-02-14: qty 20000

## 2023-02-14 MED ORDER — ACETAMINOPHEN 325 MG PO TABS: 650.0000 mg | ORAL_TABLET | ORAL | Status: DC | PRN

## 2023-02-14 MED ORDER — HYDROCODONE-ACETAMINOPHEN 10-325 MG PO TABS
1.0000 | ORAL_TABLET | ORAL | Status: DC | PRN
  Administered 2023-02-14 – 2023-02-15 (×3): 1 via ORAL
  Filled 2023-02-14 (×3): qty 1

## 2023-02-14 MED ORDER — CLONAZEPAM 0.5 MG PO TABS: 0.5000 mg | ORAL_TABLET | Freq: Two times a day (BID) | ORAL | Status: DC | PRN

## 2023-02-14 MED ORDER — CITALOPRAM HYDROBROMIDE 20 MG PO TABS
40.0000 mg | ORAL_TABLET | Freq: Every day | ORAL | Status: DC
  Administered 2023-02-14: 40 mg via ORAL
  Filled 2023-02-14 (×2): qty 2

## 2023-02-14 MED ORDER — CHLORHEXIDINE GLUCONATE CLOTH 2 % EX PADS
6.0000 | MEDICATED_PAD | Freq: Once | CUTANEOUS | Status: DC
Start: 2023-02-14 — End: 2023-02-14

## 2023-02-14 MED ORDER — HYDROMORPHONE HCL 1 MG/ML IJ SOLN
INTRAMUSCULAR | Status: AC
  Filled 2023-02-14: qty 0.5

## 2023-02-14 MED ORDER — PHENOL 1.4 % MT LIQD: 1.0000 | OROMUCOSAL | Status: DC | PRN

## 2023-02-14 MED ORDER — SUGAMMADEX SODIUM 200 MG/2ML IV SOLN
INTRAVENOUS | Status: DC | PRN
  Administered 2023-02-14: 200 mg via INTRAVENOUS

## 2023-02-14 MED ORDER — PRAVASTATIN SODIUM 40 MG PO TABS
40.0000 mg | ORAL_TABLET | Freq: Every day | ORAL | Status: DC
  Administered 2023-02-14: 40 mg via ORAL
  Filled 2023-02-14: qty 1

## 2023-02-14 MED ORDER — ONDANSETRON HCL 4 MG/2ML IJ SOLN: 4.0000 mg | Freq: Four times a day (QID) | INTRAMUSCULAR | Status: DC | PRN

## 2023-02-14 MED ORDER — METHOCARBAMOL 1000 MG/10ML IJ SOLN: 500.0000 mg | Freq: Four times a day (QID) | INTRAMUSCULAR | Status: DC | PRN

## 2023-02-14 MED ORDER — ACETAMINOPHEN 650 MG RE SUPP: 650.0000 mg | RECTAL | Status: DC | PRN

## 2023-02-14 MED ORDER — ORAL CARE MOUTH RINSE: 15.0000 mL | Freq: Once | OROMUCOSAL | Status: AC

## 2023-02-14 MED ORDER — 0.9 % SODIUM CHLORIDE (POUR BTL) OPTIME
TOPICAL | Status: DC | PRN
  Administered 2023-02-14: 1000 mL

## 2023-02-14 MED ORDER — BISACODYL 10 MG RE SUPP: 10.0000 mg | Freq: Every day | RECTAL | Status: DC | PRN

## 2023-02-14 MED ORDER — LORATADINE 10 MG PO TABS
10.0000 mg | ORAL_TABLET | Freq: Every day | ORAL | Status: DC
  Administered 2023-02-14: 10 mg via ORAL
  Filled 2023-02-14: qty 1

## 2023-02-14 MED ORDER — HYDROMORPHONE HCL 1 MG/ML IJ SOLN
0.2500 mg | INTRAMUSCULAR | Status: DC | PRN
  Administered 2023-02-14 (×2): 0.5 mg via INTRAVENOUS

## 2023-02-14 MED ORDER — INSULIN ASPART 100 UNIT/ML IJ SOLN
0.0000 [IU] | Freq: Every day | INTRAMUSCULAR | Status: DC
  Administered 2023-02-14: 2 [IU] via SUBCUTANEOUS

## 2023-02-14 MED ORDER — FENTANYL CITRATE (PF) 250 MCG/5ML IJ SOLN
INTRAMUSCULAR | Status: AC
  Filled 2023-02-14: qty 5

## 2023-02-14 MED ORDER — PROPOFOL 10 MG/ML IV BOLUS
INTRAVENOUS | Status: AC
  Filled 2023-02-14: qty 20

## 2023-02-14 MED ORDER — INSULIN ASPART 100 UNIT/ML IJ SOLN: 0.0000 [IU] | Freq: Three times a day (TID) | INTRAMUSCULAR | Status: DC

## 2023-02-14 MED ORDER — OFLOXACIN 0.3 % OP SOLN
1.0000 [drp] | Freq: Four times a day (QID) | OPHTHALMIC | Status: DC
  Filled 2023-02-14: qty 5

## 2023-02-14 MED ORDER — HYDROMORPHONE HCL 1 MG/ML IJ SOLN: 1.0000 mg | INTRAMUSCULAR | Status: DC | PRN

## 2023-02-14 MED ORDER — POLYETHYLENE GLYCOL 3350 17 G PO PACK: 17.0000 g | PACK | Freq: Every day | ORAL | Status: DC | PRN

## 2023-02-14 MED ORDER — SODIUM CHLORIDE 0.9% FLUSH: 3.0000 mL | INTRAVENOUS | Status: DC | PRN

## 2023-02-14 MED ORDER — HYDROMORPHONE HCL 1 MG/ML IJ SOLN
INTRAMUSCULAR | Status: AC
  Filled 2023-02-14: qty 1

## 2023-02-14 MED ORDER — FENTANYL CITRATE (PF) 250 MCG/5ML IJ SOLN
INTRAMUSCULAR | Status: DC | PRN
  Administered 2023-02-14: 100 ug via INTRAVENOUS
  Administered 2023-02-14: 50 ug via INTRAVENOUS
  Administered 2023-02-14: 100 ug via INTRAVENOUS

## 2023-02-14 MED ORDER — CHLORHEXIDINE GLUCONATE 0.12 % MT SOLN: 15.0000 mL | Freq: Once | OROMUCOSAL | Status: AC

## 2023-02-14 MED ORDER — SODIUM CHLORIDE 0.9% FLUSH: 3.0000 mL | Freq: Two times a day (BID) | INTRAVENOUS | Status: DC

## 2023-02-14 MED ORDER — CEFAZOLIN SODIUM-DEXTROSE 2-4 GM/100ML-% IV SOLN
2.0000 g | INTRAVENOUS | Status: AC
  Administered 2023-02-14: 2 g via INTRAVENOUS

## 2023-02-14 MED ORDER — MONTELUKAST SODIUM 10 MG PO TABS
10.0000 mg | ORAL_TABLET | Freq: Every evening | ORAL | Status: DC
  Administered 2023-02-14: 10 mg via ORAL
  Filled 2023-02-14: qty 1

## 2023-02-14 MED ORDER — MIDAZOLAM HCL 2 MG/2ML IJ SOLN
INTRAMUSCULAR | Status: AC
  Filled 2023-02-14: qty 2

## 2023-02-14 MED ORDER — CEFAZOLIN SODIUM-DEXTROSE 1-4 GM/50ML-% IV SOLN
1.0000 g | Freq: Three times a day (TID) | INTRAVENOUS | Status: AC
  Administered 2023-02-14 – 2023-02-15 (×2): 1 g via INTRAVENOUS
  Filled 2023-02-14 (×2): qty 50

## 2023-02-14 MED ORDER — CHLORHEXIDINE GLUCONATE 0.12 % MT SOLN
OROMUCOSAL | Status: AC
  Administered 2023-02-14: 15 mL via OROMUCOSAL
  Filled 2023-02-14: qty 15

## 2023-02-14 MED ORDER — METHOCARBAMOL 500 MG PO TABS
500.0000 mg | ORAL_TABLET | Freq: Four times a day (QID) | ORAL | Status: DC | PRN
  Administered 2023-02-14: 500 mg via ORAL
  Filled 2023-02-14: qty 1

## 2023-02-14 MED ORDER — CEFAZOLIN SODIUM-DEXTROSE 2-4 GM/100ML-% IV SOLN
INTRAVENOUS | Status: AC
  Filled 2023-02-14: qty 100

## 2023-02-14 MED ORDER — MENTHOL 3 MG MT LOZG: 1.0000 | LOZENGE | OROMUCOSAL | Status: DC | PRN

## 2023-02-14 MED ORDER — SODIUM CHLORIDE 0.9 % IV SOLN
250.0000 mL | INTRAVENOUS | Status: DC
  Administered 2023-02-14: 250 mL via INTRAVENOUS

## 2023-02-14 MED ORDER — ONDANSETRON HCL 4 MG PO TABS: 4.0000 mg | ORAL_TABLET | Freq: Four times a day (QID) | ORAL | Status: DC | PRN

## 2023-02-14 MED ORDER — PROPOFOL 10 MG/ML IV BOLUS
INTRAVENOUS | Status: DC | PRN
  Administered 2023-02-14: 170 mg via INTRAVENOUS

## 2023-02-14 MED ORDER — BUPIVACAINE HCL (PF) 0.25 % IJ SOLN
INTRAMUSCULAR | Status: AC
  Filled 2023-02-14: qty 30

## 2023-02-14 MED ORDER — GABAPENTIN 300 MG PO CAPS
300.0000 mg | ORAL_CAPSULE | Freq: Three times a day (TID) | ORAL | Status: DC
  Administered 2023-02-14 (×2): 300 mg via ORAL
  Filled 2023-02-14 (×2): qty 1

## 2023-02-14 MED ORDER — ONDANSETRON HCL 4 MG/2ML IJ SOLN
INTRAMUSCULAR | Status: DC | PRN
  Administered 2023-02-14: 4 mg via INTRAVENOUS

## 2023-02-14 MED ORDER — LIDOCAINE 2% (20 MG/ML) 5 ML SYRINGE
INTRAMUSCULAR | Status: DC | PRN
  Administered 2023-02-14: 60 mg via INTRAVENOUS

## 2023-02-14 SURGICAL SUPPLY — 48 items
BAG COUNTER SPONGE SURGICOUNT (BAG) ×1 IMPLANT
BENZOIN TINCTURE PRP APPL 2/3 (GAUZE/BANDAGES/DRESSINGS) ×1 IMPLANT
BLADE CLIPPER SURG (BLADE) IMPLANT
BONE MATRIX OSTEOCEL PRO LRG (Bone Implant) IMPLANT
BUR MATCHSTICK NEURO 3.0 LAGG (BURR) IMPLANT
COVER BACK TABLE 60X90IN (DRAPES) ×1 IMPLANT
DERMABOND ADVANCED .7 DNX12 (GAUZE/BANDAGES/DRESSINGS) ×2 IMPLANT
DRAPE C-ARM 42X72 X-RAY (DRAPES) ×1 IMPLANT
DRAPE C-ARMOR (DRAPES) ×1 IMPLANT
DRAPE LAPAROTOMY 100X72X124 (DRAPES) ×1 IMPLANT
DRAPE SURG 17X23 STRL (DRAPES) ×2 IMPLANT
DRSG OPSITE POSTOP 3X4 (GAUZE/BANDAGES/DRESSINGS) IMPLANT
DRSG OPSITE POSTOP 4X6 (GAUZE/BANDAGES/DRESSINGS) IMPLANT
DURAPREP 26ML APPLICATOR (WOUND CARE) ×1 IMPLANT
ELECT NVM5 SURFACE MEP/EMG (ELECTRODE) IMPLANT
ELECT REM PT RETURN 9FT ADLT (ELECTROSURGICAL) ×1 IMPLANT
ELECTRODE REM PT RTRN 9FT ADLT (ELECTROSURGICAL) ×1 IMPLANT
GAUZE 4X4 16PLY ~~LOC~~+RFID DBL (SPONGE) IMPLANT
GLOVE BIO SURGEON STRL SZ 6.5 (GLOVE) ×1 IMPLANT
GLOVE BIOGEL PI IND STRL 6.5 (GLOVE) ×1 IMPLANT
GLOVE ECLIPSE 9.0 STRL (GLOVE) ×1 IMPLANT
GLOVE EXAM NITRILE XL STR (GLOVE) IMPLANT
GOWN STRL REUS W/ TWL LRG LVL3 (GOWN DISPOSABLE) IMPLANT
GOWN STRL REUS W/ TWL XL LVL3 (GOWN DISPOSABLE) ×2 IMPLANT
GOWN STRL REUS W/TWL 2XL LVL3 (GOWN DISPOSABLE) IMPLANT
GUIDEWIRE NITINOL BEVEL TIP (WIRE) IMPLANT
KIT BASIN OR (CUSTOM PROCEDURE TRAY) ×1 IMPLANT
KIT DILATOR XLIF 5 (KITS) IMPLANT
KIT SURGICAL ACCESS MAXCESS 4 (KITS) IMPLANT
KIT TURNOVER KIT B (KITS) ×1 IMPLANT
MODULUS XLW 12X22X55MM 10 (Spine Construct) IMPLANT
NDL HYPO 22X1.5 SAFETY MO (MISCELLANEOUS) ×1 IMPLANT
NDL I-PASS III (NEEDLE) IMPLANT
NEEDLE HYPO 22X1.5 SAFETY MO (MISCELLANEOUS) ×1 IMPLANT
NEEDLE I-PASS III (NEEDLE) ×1 IMPLANT
NS IRRIG 1000ML POUR BTL (IV SOLUTION) ×1 IMPLANT
PACK LAMINECTOMY NEURO (CUSTOM PROCEDURE TRAY) ×1 IMPLANT
ROD RELINE MAS LORD 5.5X45MM (Rod) IMPLANT
SCREW LOCK RELINE 5.5 TULIP (Screw) IMPLANT
SCREW RELINE PA 6.5X45 (Screw) IMPLANT
SPONGE T-LAP 4X18 ~~LOC~~+RFID (SPONGE) IMPLANT
STRIP CLOSURE SKIN 1/2X4 (GAUZE/BANDAGES/DRESSINGS) ×1 IMPLANT
SUT VIC AB 2-0 CT1 18 (SUTURE) ×2 IMPLANT
SUT VIC AB 3-0 SH 8-18 (SUTURE) ×2 IMPLANT
TOWEL GREEN STERILE (TOWEL DISPOSABLE) ×1 IMPLANT
TOWEL GREEN STERILE FF (TOWEL DISPOSABLE) ×1 IMPLANT
TRAY FOLEY MTR SLVR 16FR STAT (SET/KITS/TRAYS/PACK) ×1 IMPLANT
WATER STERILE IRR 1000ML POUR (IV SOLUTION) ×1 IMPLANT

## 2023-02-14 NOTE — Anesthesia Postprocedure Evaluation (Signed)
 Anesthesia Post Note  Patient: Albert Benjamin  Procedure(s) Performed: LEFT LUMBAR THREE-FOUR ANTERIOR LATERAL LUMBAR FUSION WITH PERCUTANEOUS SCREW PLACEMENT (Left: Spine Lumbar)     Patient location during evaluation: PACU Anesthesia Type: General Level of consciousness: awake and alert, oriented and patient cooperative Pain management: pain level controlled Vital Signs Assessment: post-procedure vital signs reviewed and stable Respiratory status: spontaneous breathing, nonlabored ventilation and respiratory function stable Cardiovascular status: blood pressure returned to baseline and stable Postop Assessment: no apparent nausea or vomiting Anesthetic complications: no   No notable events documented.  Last Vitals:  Vitals:   02/14/23 1445 02/14/23 1500  BP: 112/74 112/80  Pulse: 75 77  Resp: 18 13  Temp:    SpO2: 98% 96%    Last Pain:  Vitals:   02/14/23 1430  PainSc: 6    Pain Goal:    LLE Motor Response: Purposeful movement (02/14/23 1430) LLE Sensation: Full sensation (02/14/23 1430) RLE Motor Response: Purposeful movement (02/14/23 1430) RLE Sensation: Full sensation (02/14/23 1430)        [name]

## 2023-02-14 NOTE — Transfer of Care (Signed)
 Immediate Anesthesia Transfer of Care Note  Patient: Albert Benjamin  Procedure(s) Performed: LEFT LUMBAR THREE-FOUR ANTERIOR LATERAL LUMBAR FUSION WITH PERCUTANEOUS SCREW PLACEMENT (Left: Spine Lumbar)  Patient Location: PACU  Anesthesia Type:General  Level of Consciousness: awake, alert , and oriented  Airway & Oxygen Therapy: Patient Spontanous Breathing and Patient connected to face mask oxygen  Post-op Assessment: Report given to RN and Post -op Vital signs reviewed and stable  Post vital signs: Reviewed and stable  Last Vitals:  Vitals Value Taken Time  BP 119/85 02/14/23 1422  Temp    Pulse 73 02/14/23 1425  Resp 14 02/14/23 1425  SpO2 99 % 02/14/23 1425  Vitals shown include unfiled device data.  Last Pain:  Vitals:   02/14/23 0935  PainSc: 0-No pain         Complications: No notable events documented.

## 2023-02-14 NOTE — Op Note (Signed)
 Date of procedure: 02/14/2023  Date of dictation: Same  Service: Neurosurgery  Preoperative diagnosis: L3-L4 degenerative/postlaminectomy spondylolisthesis with foraminal stenosis and radiculopathy  Postoperative diagnosis: Same  Procedure Name: Left L3-L4 anterior lateral retroperitoneal interbody decompression and fusion utilizing interbody cage, morselized allograft  L3-4 left-sided posterior percutaneous pedicle screw fixation  Surgeon:Melburn Treiber A.Nikolos Billig, M.D.  Asst. Surgeon: Jennetta, NP  Anesthesia: General  Indication: 73 year old male status post multilevel lumbar decompressive surgery.  Patient with persistent left-sided back pain with radicular symptoms.  Workup demonstrates evidence of an unstable degenerative/postlaminectomy spondylolisthesis at L3-4 with severe foraminal stenosis.  Patient has failed conservative management and presents now for left-sided L3-4 lateral interbody fusion.  Operative note: After induction anesthesia, patient positioned in the right lateral decubitus position and appropriately padded.  Patient's left lateral abdomen flank and lumbar region prepped and draped sterilely.  The patient was positioned with the bed flexed for access to the L3-4 space.  An incision was made overlying L3-4 in the left lateral abdomen.  This carried down sharply to the subcutaneous fat.  A second incision was made in the left lateral flank.  This is carried down bluntly into the retroperitoneal space.  Peritoneal contents were swept anteriorly.  Returning to the lateral incision a dilator was then passed into the left-sided psoas muscle and docking into the left L3-4 disc base.  There was concern to be in good position by intraoperative fluoroscopy.  Continuous neurologic monitoring with direct stimulation was performed throughout.  The dilator was confirmed to be in good position and free from any adjacent structures of the lumbar plexus.  A guidewire was then passed into the L3-4 disc  space.  The dilator was sequentially enlarged stimulating with each enlargement.  Again the safe passage was confirmed.  Self-retaining retractor was then placed and docked into the lateral spine.  The retractor was opened slightly and the dilators were removed.  The guidewire was left in place.  Direct stimulation of the disc base and the planned position of the shim was performed.  This was confirmed to be safe.  A shim was then impacted into the left L3-4 disc space.  The retractor was opened further.  The disc base was cleaned of overlying soft tissue.  There was no evidence of any traversing nerves.  Direct stimulation confirmed no adjacent nerves.  The disc base was incised.  Discectomy was then performed using various instruments.  Contralateral release was then performed using Cobb elevators.  Disc base was further curettaged preparing the endplates for later fusion.  The disc base was then sized and a 12 mm x 22 mm x 55 mm cage was found to be appropriate.  A 12 x 22 x 55 NuVasive cage was packed with Osteocel plus.  This is then impacted into place and confirmed to be in good position in both the AP and lateral plane.  The patient's deformity was completely reduced and his neural foramina was widely opened.  The retractor system was removed.  Attention then placed to his lumbar region.  Localizing fluoroscopy was used and stab incisions were made overlying the left L3 and L4 pedicles.  A Jamshidi needle introducer was then passed into the pedicles of L3 and L4 extending into the vertebral body.  This was confirmed to have safe passage both by intraoperative neuromonitoring and direct fluoroscopy.  Guidewires were placed.  The pedicles were then tapped with a screw tap.  6.5 x 45 mm NuVasive pedicle screws were then passed over the guidewire  and solidly secured in place.  Guidewires were removed.  A 45 mm rod was then passed through the screw towers.  Locking caps were then placed over the screw towers and  secured to the screw heads.  Final tightening was performed.  The towers were then removed.  Final images reveal good position of the cage and the hardware at the proper operative level with normal alignment of the spine.  All wounds were irrigated then closed in layers with Vicryl sutures.  Steri-Strips and sterile dressing were applied.  No apparent complications.  Patient tolerated the procedure well and he returns to recovery room postop.

## 2023-02-14 NOTE — Anesthesia Preprocedure Evaluation (Addendum)
 Anesthesia Evaluation  Patient identified by MRN, date of birth, ID band Patient awake    Reviewed: Allergy & Precautions, H&P , NPO status , Patient's Chart, lab work & pertinent test results  Airway Mallampati: III  TM Distance: >3 FB Neck ROM: Full    Dental no notable dental hx. (+) Teeth Intact, Dental Advisory Given   Pulmonary sleep apnea    Pulmonary exam normal breath sounds clear to auscultation       Cardiovascular hypertension, Pt. on medications  Rhythm:Regular Rate:Normal     Neuro/Psych   Anxiety Depression    negative neurological ROS     GI/Hepatic negative GI ROS, Neg liver ROS,,,  Endo/Other  diabetes, Type 2, Oral Hypoglycemic Agents  Class 3 obesity  Renal/GU negative Renal ROS  negative genitourinary   Musculoskeletal  (+) Arthritis , Osteoarthritis,    Abdominal   Peds  Hematology negative hematology ROS (+)   Anesthesia Other Findings   Reproductive/Obstetrics negative OB ROS                             Anesthesia Physical Anesthesia Plan  ASA: 3  Anesthesia Plan: General   Post-op Pain Management: Ofirmev  IV (intra-op)*   Induction: Intravenous  PONV Risk Score and Plan: 3 and Ondansetron , Dexamethasone  and Treatment may vary due to age or medical condition  Airway Management Planned: Oral ETT  Additional Equipment:   Intra-op Plan:   Post-operative Plan: Extubation in OR  Informed Consent: I have reviewed the patients History and Physical, chart, labs and discussed the procedure including the risks, benefits and alternatives for the proposed anesthesia with the patient or authorized representative who has indicated his/her understanding and acceptance.     Dental advisory given  Plan Discussed with: CRNA  Anesthesia Plan Comments:        Anesthesia Quick Evaluation

## 2023-02-14 NOTE — Anesthesia Procedure Notes (Signed)
 Procedure Name: Intubation Date/Time: 02/14/2023 12:04 PM  Performed by: Delores Dus, CRNAPre-anesthesia Checklist: Patient identified, Emergency Drugs available, Suction available and Patient being monitored Patient Re-evaluated:Patient Re-evaluated prior to induction Oxygen Delivery Method: Circle system utilized Preoxygenation: Pre-oxygenation with 100% oxygen Induction Type: IV induction Ventilation: Mask ventilation without difficulty Laryngoscope Size: Glidescope and 4 Grade View: Grade I Tube type: Oral Tube size: 7.5 mm Number of attempts: 1 Airway Equipment and Method: Stylet and Oral airway Placement Confirmation: ETT inserted through vocal cords under direct vision, positive ETCO2 and breath sounds checked- equal and bilateral Secured at: 22 cm Tube secured with: Tape Dental Injury: Teeth and Oropharynx as per pre-operative assessment

## 2023-02-14 NOTE — Progress Notes (Signed)
 Orthopedic Tech Progress Note Patient Details:  Albert Benjamin 1950/02/24 [id number]  Ortho Devices Type of Ortho Device: Lumbar corsett Ortho Device/Splint Location: Back Ortho Device/Splint Interventions: Adjustment, Ordered   Post Interventions Patient Tolerated: Well  Efrain DELENA Cos 02/14/2023, 4:43 PM

## 2023-02-14 NOTE — H&P (Signed)
 Albert Benjamin is an 73 y.o. male.   Chief Complaint: Back pain HPI: 73 year old male status post multilevel lumbar decompressive surgery presents with worsening back and left lower extremity pain failing conservative management workup demonstrates evidence of instability and progressive spondylolisthesis at L3-4.  Remaining aspects of his decompression appear adequate without complication.  Patient has failed conservative management presents now for left-sided L3-4 lateral lumbar interbody fusion with instrumentation in hopes improving his symptoms.  Past Medical History:  Diagnosis Date   Allergic rhinitis    Aphakia, left eye 02/03/2020   Secondary insertion of posterior chamber intraocular lens via Yamani scleral tunnel technique 02/02/2020 left eye   Arthritis    Cataract    bilkateral surgery 3-40 years ago   Chronic pain syndrome    Depression    Erectile dysfunction    GAD (generalized anxiety disorder)    Hyperlipidemia    Hypertension    Lumbar stenosis with neurogenic claudication    Obstructive sleep apnea on CPAP    T2DM (type 2 diabetes mellitus) (HCC)    Vitreous prolapse, left eye 01/11/2020   Wears glasses    Wears hearing aid    both ears    Past Surgical History:  Procedure Laterality Date   ANKLE FRACTURE SURGERY Right    ANTERIOR CERVICAL DECOMP/DISCECTOMY FUSION N/A 03/11/2012   Procedure: ANTERIOR CERVICAL DECOMPRESSION/DISCECTOMY FUSION 3 LEVELS;  Surgeon: Catalina CHRISTELLA Stains, MD;  Location: [facility];  Service: Neurosurgery;  Laterality: N/A;  Anterior cervical decompression/fusion Cervical four-five, Cervical five-six, Cervical six-seven   COLONOSCOPY     EYE SURGERY     catarct bil   EYE SURGERY Right    01/2023- laser surgery for floater in eye   KNEE ARTHROSCOPY  01/07/2005   left, right 09/2011   LUMBAR LAMINECTOMY/DECOMPRESSION MICRODISCECTOMY N/A 08/28/2020   Procedure: Laminectomy and Foraminotomy - Lumbar Two-Lumbar Three, Lumbar Three-Lumbar  Four, Lumbar Four-Lumbar Five,  Lumbar Five-Sacral One;  Surgeon: Louis Shove, MD;  Location: [facility];  Service: Neurosurgery;  Laterality: N/A;   SHOULDER ARTHROSCOPY  2005,2010-   rightx2-dsc    Family History  Problem Relation Age of Onset   Diabetes Mother    Colon cancer Neg Hx    Heart disease Neg Hx    Esophageal cancer Neg Hx    Pancreatic cancer Neg Hx    Stomach cancer Neg Hx    Rectal cancer Neg Hx    Social History:  reports that he has never smoked. He has never used smokeless tobacco. He reports that he does not drink alcohol  and does not use drugs.  Allergies:  Allergies  Allergen Reactions   Flexeril  [Cyclobenzaprine ] Other (See Comments)    Hallucinations-reported per patient's account    Medications Prior to Admission  Medication Sig Dispense Refill   cetirizine (ZYRTEC) 10 MG tablet Take 10 mg by mouth daily.     citalopram  (CELEXA ) 40 MG tablet TAKE 1 TABLET BY MOUTH ONCE DAILY 90 tablet 1   clonazePAM  (KLONOPIN ) 0.5 MG tablet Take 1 tablet (0.5 mg total) by mouth daily as needed for anxiety. 30 tablet 2   gabapentin  (NEURONTIN ) 300 MG capsule Take 300 mg by mouth 3 (three) times daily.     hydrochlorothiazide  (MICROZIDE ) 12.5 MG capsule Take 1 capsule by mouth once daily. 30 capsule 1   montelukast  (SINGULAIR ) 10 MG tablet Take 1 tablet by mouth once daily in the evening. 90 tablet 1   ofloxacin  (OCUFLOX ) 0.3 % ophthalmic solution Place 1 drop  into the right eye 4 (four) times daily.     pioglitazone  (ACTOS ) 30 MG tablet Take 1 tablet (30 mg total) by mouth daily. 90 tablet 3   pravastatin  (PRAVACHOL ) 40 MG tablet TAKE ONE TABLET BY MOUTH AT BEDTIME 90 tablet 1   prednisoLONE  acetate (PRED FORTE ) 1 % ophthalmic suspension Place 1 drop into the right eye 4 (four) times daily.     glucose blood test strip Use as instructed 100 each 12    Results for orders placed or performed during the hospital encounter of 02/14/23 (from the past 48 hours)  Glucose,  capillary     Status: None   Collection Time: 02/14/23  9:22 AM  Result Value Ref Range   Glucose-Capillary 98 70 - 99 mg/dL    Comment: Glucose reference range applies only to samples taken after fasting for at least 8 hours.   No results found.  Pertinent items noted in HPI and remainder of comprehensive ROS otherwise negative.  Blood pressure (!) 158/86, pulse 70, temperature 97.9 F (36.6 C), resp. rate 18, height 5' 6 (1.676 m), weight 110.6 kg, SpO2 100%.  Patient is awake and alert.  He is oriented and appropriate.  Radial nerve function normal by.  Motor examination intact bilaterally.  Sensory examination some decrease sensation pinprick light touch in his left L3 dermatome.  Deep tendon reflexes hypoactive but symmetric.  No evidence of long track signs.  Gait antalgic posture reasonably normal peer examination head ears eyes and throat is unremarked.  Chest and abdomen are benign.  Extremities are free from injury deformity. Assessment/Plan L3-4 degenerative/postlaminectomy spondylolisthesis with radiculopathy.  Plan left L3-4 anterior lateral retroperitoneal interbody decompression and fusion utilizing interbody cage and morselized allograft coupled with posterior percutaneous pedicle screw fixation on the left side.  Risks and benefits been explained.  Patient wishes proceed.  [facility] LABOR Dupree Givler 02/14/2023, 10:41 AM

## 2023-02-14 NOTE — Brief Op Note (Signed)
 02/14/2023  2:11 PM  PATIENT:  Albert Benjamin  73 y.o. male  PRE-OPERATIVE DIAGNOSIS:  Stenosis  POST-OPERATIVE DIAGNOSIS:  Stenosis  PROCEDURE:  Procedure(s): LEFT LUMBAR THREE-FOUR ANTERIOR LATERAL LUMBAR FUSION WITH PERCUTANEOUS SCREW PLACEMENT (Left)  SURGEON:  Surgeons and Role:    DEWAINE Louis Shove, MD - Primary  PHYSICIAN ASSISTANT:   ASSISTANTSBETHA Jennetta PIETY   ANESTHESIA:   general  EBL:  50 mL   BLOOD ADMINISTERED:none  DRAINS: none   LOCAL MEDICATIONS USED:  MARCAINE      SPECIMEN:  No Specimen  DISPOSITION OF SPECIMEN:  N/A  COUNTS:  YES  TOURNIQUET:  * No tourniquets in log *  DICTATION: .Dragon Dictation  PLAN OF CARE: Admit to inpatient   PATIENT DISPOSITION:  PACU - hemodynamically stable.   Delay start of Pharmacological VTE agent (>24hrs) due to surgical blood loss or risk of bleeding: yes

## 2023-02-15 LAB — GLUCOSE, CAPILLARY: Glucose-Capillary: 70 mg/dL (ref 70–99)

## 2023-02-15 MED ORDER — METHOCARBAMOL 500 MG PO TABS: 500.0000 mg | ORAL_TABLET | Freq: Four times a day (QID) | ORAL | 1 refills | Status: DC | PRN

## 2023-02-15 MED ORDER — HYDROCODONE-ACETAMINOPHEN 10-325 MG PO TABS: 1.0000 | ORAL_TABLET | ORAL | 0 refills | Status: DC | PRN

## 2023-02-15 NOTE — Evaluation (Signed)
 Physical Therapy Evaluation Patient Details Name: Albert Benjamin MRN: [id number] DOB: 27-Oct-1950 Today's Date: 02/15/2023  History of Present Illness  Pt is a 73 yr old male who presented due to back pain who has a hx of lumbar decompressive sx. Pt s/p 02/14/23 L Lumbar 3-4 anterior lateral lumbar fusion. PMH: DM, HTN, OSA on CPAP  Clinical Impression  Pt s/p above surgery.  Mobilizing well, with some limitations with sit to stand transfers and wil likely have difficulty from lower surfaces. Issued gait belt to pt/wife. PT/wife do not feel the need for F/u PT at this time, but possibly a few months down the road once healed form surgery. I have answered all patient's question regarding PT and mobility.    Pt feels ready for DC home today.   I have encouraged the patient to gradually increase activity daily to tolerance.           If plan is discharge home, recommend the following:     Can travel by private vehicle        Equipment Recommendations None recommended by PT (Pt either has or can borrow a RW)  Recommendations for Other Services       Functional Status Assessment Patient has had a recent decline in their functional status and demonstrates the ability to make significant improvements in function in a reasonable and predictable amount of time.     Precautions / Restrictions Precautions Precautions: Fall;Back Precaution Booklet Issued: Yes (comment) Precaution Comments: Pt able to recall 3/3 back precautions Required Braces or Orthoses: Spinal Brace Spinal Brace: Lumbar corset;Applied in sitting position Restrictions Weight Bearing Restrictions Per Provider Order: No      Mobility  Bed Mobility Overal bed mobility:  (NT, pt up in chair. Reviewed log rolling)                  Transfers Overall transfer level: Needs assistance Equipment used: Rolling walker (2 wheels) Transfers: Sit to/from Stand Sit to Stand: Contact guard assist           General transfer  comment: requires use of UEs to power up.    Ambulation/Gait Ambulation/Gait assistance: Contact guard assist Gait Distance (Feet): 140 Feet Assistive device: Rolling walker (2 wheels) Gait Pattern/deviations: Step-through pattern, Decreased stride length       General Gait Details: no loss of balance with gait  Stairs Stairs:  (Pt did not feel the need to practice stairs this AM)          Wheelchair Mobility     Tilt Bed    Modified Rankin (Stroke Patients Only)       Balance Overall balance assessment: Mild deficits observed, not formally tested                                           Pertinent Vitals/Pain Pain Assessment Pain Assessment: 0-10 Pain Score: 5  Pain Location: low back Pain Descriptors / Indicators: Discomfort Pain Intervention(s): Limited activity within patient's tolerance, Monitored during session    Home Living Family/patient expects to be discharged to:: Private residence Living Arrangements: Spouse/significant other Available Help at Discharge: Family Type of Home: House Home Access: Stairs to enter Entrance Stairs-Rails: Left Entrance Stairs-Number of Steps: 3   Home Layout: One level Home Equipment: Rollator (4 wheels);BSC/3in1;Shower seat - built in;Grab bars - tub/shower;Hand held shower head;Adaptive equipment Additional Comments: May have  a RW at home, but if not can borrow one. Did not want to order one before leaving hospital    Prior Function Prior Level of Function : Independent/Modified Independent             Mobility Comments: activity limited before surgery due to pain ADLs Comments: Pt reported sometimes help depending on pain level     Extremity/Trunk Assessment   Upper Extremity Assessment Upper Extremity Assessment: Defer to OT evaluation    Lower Extremity Assessment Lower Extremity Assessment: Generalized weakness    Cervical / Trunk Assessment Cervical / Trunk Assessment: Back  Surgery  Communication   Communication Communication: No apparent difficulties Cueing Techniques:  (wears hearing aides)  Cognition Arousal: Alert Behavior During Therapy: WFL for tasks assessed/performed Overall Cognitive Status: Within Functional Limits for tasks assessed                                          General Comments General comments (skin integrity, edema, etc.): wife present for PT eval and engaged in learning    Exercises     Assessment/Plan    PT Assessment Patient does not need any further PT services  PT Problem List         PT Treatment Interventions      PT Goals (Current goals can be found in the Care Plan section)  Acute Rehab PT Goals Patient Stated Goal: to get back to work    Frequency       Co-evaluation               AM-PAC PT 6 Clicks Mobility  Outcome Measure Help needed turning from your back to your side while in a flat bed without using bedrails?: A Little Help needed moving from lying on your back to sitting on the side of a flat bed without using bedrails?: A Little Help needed moving to and from a bed to a chair (including a wheelchair)?: A Little Help needed standing up from a chair using your arms (e.g., wheelchair or bedside chair)?: A Little Help needed to walk in hospital room?: A Little Help needed climbing 3-5 steps with a railing? : A Little 6 Click Score: 18    End of Session Equipment Utilized During Treatment: Gait belt;Back brace Activity Tolerance: Patient tolerated treatment well Patient left: in chair;with call bell/phone within reach;with family/visitor present Nurse Communication: Mobility status PT Visit Diagnosis: Unsteadiness on feet (R26.81)    Time: 9165-9097 PT Time Calculation (min) (ACUTE ONLY): 28 min   Charges:   PT Evaluation $PT Eval Low Complexity: 1 Low PT Treatments $Gait Training: 8-22 mins PT General Charges $$ ACUTE PT VISIT: 1 Visit         Oneil Schick, PT   Acute Rehabilitation Services  Office [phone] 02/15/2023   Schick Oneil PARAS 02/15/2023, 10:17 AM

## 2023-02-15 NOTE — Evaluation (Signed)
 Occupational Therapy Evaluation Patient Details Name: Albert Benjamin MRN: [id number] DOB: 04/26/1950 Today's Date: 02/15/2023   History of Present Illness Pt is a 73 yr old male who presented due to back pain who has a hx of lumbar decompressive sx. Pt s/p 02/14/23 L Lumbar 3-4 anterior lateral lumbar fusion. 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 He was also sleeping in a recliner at baseline but hoping to be able to go back to sleeping in the bed after her recovers. At this time pt and wife was educated on precautions and how to don/doff brace. He was able to complete UE dressing with supervision to CGA and LB dressing with AE and min assist. Mr. Korn needed min-moderate assist with sit to stand transfers and cues on positioning. He was educated about elevating surfaces as noted he fatigues quickly at this time to compensate. At this time recommendation for OP Physical Therapy. Thank you.          If plan is discharge home, recommend the following: A little help with walking and/or transfers;A little help with bathing/dressing/bathroom;Assistance with cooking/housework;Assist for transportation;Help with stairs or ramp for entrance    Functional Status Assessment  Patient has had a recent decline in their functional status and demonstrates the ability to make significant improvements in function in a reasonable and predictable amount of time.  Equipment Recommendations   (Possible need for FW)    Recommendations for Other Services       Precautions / Restrictions Precautions Precautions: Fall;Back Precaution Booklet Issued: No Precaution Comments: reviewed back precautions in session and spoke to PT to give to pt Required Braces or Orthoses: Spinal Brace Spinal Brace: Lumbar corset;Applied in standing position Restrictions Weight Bearing Restrictions Per Provider Order: No       Mobility Bed Mobility Overal bed mobility: Needs Assistance Bed Mobility: Rolling, Supine to Sit Rolling: Contact guard assist   Supine to sit: Min assist          Transfers Overall transfer level: Needs assistance Equipment used: Rolling walker (2 wheels) Transfers: Sit to/from Stand Sit to Stand: Mod assist, Min assist           General transfer comment: mod assist off low surfaces and min assist of elevated positions      Balance Overall balance assessment: Needs assistance Sitting-balance support: Feet supported Sitting balance-Leahy Scale: Good     Standing balance support: Bilateral upper extremity supported Standing balance-Leahy Scale: Poor Standing balance comment: able to don LE clothing in stadning but noted postural sway but pt was awaare                           ADL either performed or assessed with clinical judgement   ADL Overall ADL's : Needs assistance/impaired Eating/Feeding: Independent;Sitting   Grooming: Wash/dry hands;Wash/dry face;Contact guard assist;Cueing for safety;Cueing for sequencing   Upper Body Bathing: Contact guard assist;Sitting   Lower Body Bathing: Minimal assistance;Contact guard assist;Cueing for safety;With adaptive equipment   Upper Body Dressing : Contact guard assist;Sitting   Lower Body Dressing: Minimal assistance;Contact guard assist;Cueing for safety;Cueing for sequencing;Sit to/from stand   Toilet Transfer: Minimal assistance;Cueing for safety;Cueing for sequencing;Rolling walker (2 wheels)   Toileting- Clothing Manipulation and Hygiene: Minimal assistance;Cueing for sequencing;Cueing for safety;Sit to/from stand       Functional mobility  during ADLs: Contact guard assist;Cueing for safety;Cueing for sequencing;Rolling walker (2 wheels)       Vision Baseline Vision/History: 1 Wears glasses Patient Visual Report:  (Pt recently had eye sx to R eye) Vision Assessment?: No apparent visual  deficits     Perception Perception: Within Functional Limits       Praxis Praxis: WFL       Pertinent Vitals/Pain Pain Assessment Pain Assessment: 0-10 Pain Score: 5  Pain Location: sx site/back/LLE Pain Descriptors / Indicators: Discomfort Pain Intervention(s): Limited activity within patient's tolerance, Monitored during session     Extremity/Trunk Assessment Upper Extremity Assessment Upper Extremity Assessment: Overall WFL for tasks assessed;Right hand dominant (Pt has a hx R shoulder sx and limited strength but WFL for AROM)   Lower Extremity Assessment Lower Extremity Assessment: Defer to PT evaluation   Cervical / Trunk Assessment Cervical / Trunk Assessment: Back Surgery   Communication Communication Communication: No apparent difficulties Cueing Techniques:  (wears hearing aides)   Cognition Arousal: Alert Behavior During Therapy: WFL for tasks assessed/performed Overall Cognitive Status: Within Functional Limits for tasks assessed                                       General Comments       Exercises     Shoulder Instructions      Home Living Family/patient expects to be discharged to:: Private residence Living Arrangements: Spouse/significant other Available Help at Discharge: Family Type of Home: House Home Access: Stairs to enter Secretary/administrator of Steps: 3 Entrance Stairs-Rails: Left (wall on R side) Home Layout: One level     Bathroom Shower/Tub: Producer, Television/film/video: Standard Bathroom Accessibility: Yes   Home Equipment: Rollator (4 wheels);BSC/3in1;Shower seat - built in;Grab bars - tub/shower;Hand held shower head;Adaptive equipment Adaptive Equipment: Reacher;Long-handled shoe horn        Prior Functioning/Environment Prior Level of Function : Independent/Modified Independent             Mobility Comments: Pt reported going out of the home daily but limited due to pain ADLs Comments: Pt  reported sometimes help depending on pain level        OT Problem List: Impaired balance (sitting and/or standing);Decreased safety awareness;Decreased knowledge of use of DME or AE;Cardiopulmonary status limiting activity;Pain      OT Treatment/Interventions:      OT Goals(Current goals can be found in the care plan section) Acute Rehab OT Goals Patient Stated Goal: to have less pain and go back to work OT Goal Formulation: With patient Time For Goal Achievement: 03/01/23 Potential to Achieve Goals: Good  OT Frequency:      Co-evaluation              AM-PAC OT 6 Clicks Daily Activity     Outcome Measure Help from another person eating meals?: None Help from another person taking care of personal grooming?: A Little Help from another person toileting, which includes using toliet, bedpan, or urinal?: A Little Help from another person bathing (including washing, rinsing, drying)?: A Little Help from another person to put on and taking off regular upper body clothing?: A Little Help from another person to put on and taking off regular lower body clothing?: A Little 6 Click Score: 19   End of Session Equipment Utilized During Treatment: Gait belt;Rolling walker (2 wheels) Nurse Communication: Mobility status  Activity Tolerance: Patient  limited by pain Patient left: in chair;with call bell/phone within reach;with family/visitor present  OT Visit Diagnosis: Unsteadiness on feet (R26.81);Other abnormalities of gait and mobility (R26.89);Muscle weakness (generalized) (M62.81);Pain Pain - Right/Left:  (sx site)                Time: 9259-9164 OT Time Calculation (min): 55 min Charges:  OT General Charges $OT Visit: 1 Visit OT Evaluation $OT Eval Moderate Complexity: 1 Mod OT Treatments $Self Care/Home Management : 38-52 mins  Warrick POUR OTR/L  Acute Rehab Services  [phone] office number   Warrick Berber 02/15/2023, 9:57 AM

## 2023-02-15 NOTE — Progress Notes (Signed)
 Patient alert and oriented, ambulate, void. Surgical sites with minimal drainage educate the patient and wife how to change the dressing if needed, wife stated she have supplies at home. D/c instructions explain and given all questions answered.

## 2023-02-15 NOTE — Discharge Summary (Signed)
 Physician Discharge Summary  Patient ID: Albert Benjamin MRN: [id number] DOB/AGE: 02/02/1950 72 y.o.  Admit date: 02/14/2023 Discharge date: 02/15/2023  Admission Diagnoses: spondylolisthesis    Discharge Diagnoses: same   Discharged Condition: good  Hospital Course: The patient was admitted on 02/14/2023 and taken to the operating room where the patient underwent XLIF L3-4. The patient tolerated the procedure well and was taken to the recovery room and then to the floor in stable condition. The hospital course was routine. There were no complications. The wound remained clean dry and intact. Pt had appropriate back soreness. No complaints of leg pain or new N/T/W. The patient remained afebrile with stable vital signs, and tolerated a regular diet. The patient continued to increase activities, and pain was well controlled with oral pain medications.   Consults: None  Significant Diagnostic Studies:  Results for orders placed or performed during the hospital encounter of 02/14/23  Glucose, capillary   Collection Time: 02/14/23  9:22 AM  Result Value Ref Range   Glucose-Capillary 98 70 - 99 mg/dL  ABO/Rh   Collection Time: 02/14/23  9:50 AM  Result Value Ref Range   ABO/RH(D)      A POS Performed at [facility], [address], [place], [place] [address]   Glucose, capillary   Collection Time: 02/14/23 11:16 AM  Result Value Ref Range   Glucose-Capillary 96 70 - 99 mg/dL   Comment 1 Notify RN    Comment 2 Document in Chart   Glucose, capillary   Collection Time: 02/14/23  2:27 PM  Result Value Ref Range   Glucose-Capillary 83 70 - 99 mg/dL  Glucose, capillary   Collection Time: 02/14/23  4:19 PM  Result Value Ref Range   Glucose-Capillary 120 (H) 70 - 99 mg/dL  Glucose, capillary   Collection Time: 02/14/23  9:04 PM  Result Value Ref Range   Glucose-Capillary 206 (H) 70 - 99 mg/dL   Comment 1 Notify RN    Comment 2 Document in Chart   Glucose, capillary   Collection  Time: 02/15/23  5:56 AM  Result Value Ref Range   Glucose-Capillary 70 70 - 99 mg/dL   Comment 1 Notify RN    Comment 2 Document in Chart     DG Lumbar Spine 2-3 Views Result Date: 02/14/2023 CLINICAL DATA:  Provided history: Left lumbar 3-4 anterior lateral lumbar fusion with percutaneous screw placement. Provided fluoroscopy time: 2 minutes, 19 seconds (118.01 mGy). EXAM: LUMBAR SPINE - 2-3 VIEW COMPARISON:  Lumbar spine MRI 11/24/2022. Lumbar spine radiographs 08/28/2020. FINDINGS: The lowest well-formed intervertebral disc space is designated L5-S1. Spinal numbering remains consistent with that utilized on the prior lumbar spine MRI of 11/24/2022. Four intraoperative fluoroscopic images of the lumbar spine are submitted. On the final fluoroscopic images provided, a left-sided posterior spinal fusion construct and an interbody device are present at the L3-L4 level. Correlate with the operative history. IMPRESSION: Four intraoperative fluoroscopic images of the lumbar spine as described. Electronically Signed   By: Rockey Childs D.O.   On: 02/14/2023 16:43   DG C-Arm 1-60 Min-No Report Result Date: 02/14/2023 Fluoroscopy was utilized by the requesting physician.  No radiographic interpretation.   DG C-Arm 1-60 Min-No Report Result Date: 02/14/2023 Fluoroscopy was utilized by the requesting physician.  No radiographic interpretation.    Antibiotics:  Anti-infectives (From admission, onward)    Start     Dose/Rate Route Frequency Ordered Stop   02/14/23 2000  ceFAZolin  (ANCEF ) IVPB 1 g/50  mL premix        1 g 100 mL/hr over 30 Minutes Intravenous Every 8 hours 02/14/23 1538 02/15/23 0324   02/14/23 1015  ceFAZolin  (ANCEF ) IVPB 2g/100 mL premix        2 g 200 mL/hr over 30 Minutes Intravenous On call to O.R. 02/14/23 0921 02/14/23 1209   02/14/23 0930  ceFAZolin  (ANCEF ) IVPB 2g/100 mL premix  Status:  Discontinued        2 g 200 mL/hr over 30 Minutes Intravenous On call to O.R. 02/14/23 [id number]  02/14/23 0926   02/14/23 0922  ceFAZolin  (ANCEF ) 2-4 GM/100ML-% IVPB       Note to Pharmacy: [name]: cabinet override      02/14/23 0922 02/14/23 1226       Discharge Exam: Blood pressure 101/69, pulse 83, temperature 98.5 F (36.9 C), resp. rate 20, height 5' 6 (1.676 m), weight 110.6 kg, SpO2 97%. Neurologic: Grossly normal Dressing dry  Discharge Medications:   Allergies as of 02/15/2023       Reactions   Flexeril  [cyclobenzaprine ] Other (See Comments)   Hallucinations-reported per patient's account        Medication List     TAKE these medications    cetirizine 10 MG tablet Commonly known as: ZYRTEC Take 10 mg by mouth daily.   citalopram  40 MG tablet Commonly known as: CELEXA  TAKE 1 TABLET BY MOUTH ONCE DAILY   clonazePAM  0.5 MG tablet Commonly known as: KLONOPIN  Take 1 tablet (0.5 mg total) by mouth daily as needed for anxiety.   gabapentin  300 MG capsule Commonly known as: NEURONTIN  Take 300 mg by mouth 3 (three) times daily.   glucose blood test strip Use as instructed   hydrochlorothiazide  12.5 MG capsule Commonly known as: MICROZIDE  Take 1 capsule by mouth once daily.   HYDROcodone -acetaminophen  10-325 MG tablet Commonly known as: NORCO Take 1 tablet by mouth every 4 (four) hours as needed for moderate pain (pain score 4-6) ((score 4 to 6)).   methocarbamol  500 MG tablet Commonly known as: ROBAXIN  Take 1 tablet (500 mg total) by mouth every 6 (six) hours as needed for muscle spasms.   montelukast  10 MG tablet Commonly known as: SINGULAIR  Take 1 tablet by mouth once daily in the evening.   ofloxacin  0.3 % ophthalmic solution Commonly known as: OCUFLOX  Place 1 drop into the right eye 4 (four) times daily.   pioglitazone  30 MG tablet Commonly known as: Actos  Take 1 tablet (30 mg total) by mouth daily.   pravastatin  40 MG tablet Commonly known as: PRAVACHOL  TAKE ONE TABLET BY MOUTH AT BEDTIME   prednisoLONE  acetate 1 %  ophthalmic suspension Commonly known as: PRED FORTE  Place 1 drop into the right eye 4 (four) times daily.               Durable Medical Equipment  (From admission, onward)           Start     Ordered   02/14/23 1539  DME Walker rolling  Once       Question:  Patient needs a walker to treat with the following condition  Answer:  Degenerative spondylolisthesis   02/14/23 1538   02/14/23 1539  DME 3 n 1  Once        02/14/23 1538            Disposition: home   Final Dx: XLIF L3-4  Discharge Instructions      Remove dressing in 72 hours  Complete by: As directed    Call MD for:  difficulty breathing, headache or visual disturbances   Complete by: As directed    Call MD for:  hives   Complete by: As directed    Call MD for:  persistant nausea and vomiting   Complete by: As directed    Call MD for:  redness, tenderness, or signs of infection (pain, swelling, redness, odor or green/yellow discharge around incision site)   Complete by: As directed    Call MD for:  severe uncontrolled pain   Complete by: As directed    Diet - low sodium heart healthy   Complete by: As directed    Increase activity slowly   Complete by: As directed           Signed: Alm GORMAN Molt 02/15/2023, 7:52 AM

## 2023-02-17 ENCOUNTER — Other Ambulatory Visit: Payer: Self-pay | Admitting: Internal Medicine

## 2023-02-17 ENCOUNTER — Encounter (HOSPITAL_COMMUNITY): Payer: Self-pay | Admitting: Neurosurgery

## 2023-03-07 ENCOUNTER — Other Ambulatory Visit: Payer: Self-pay | Admitting: Internal Medicine

## 2023-03-17 ENCOUNTER — Ambulatory Visit: Admitting: Internal Medicine

## 2023-03-17 ENCOUNTER — Encounter: Payer: Self-pay | Admitting: Internal Medicine

## 2023-03-17 VITALS — BP 126/78 | HR 77 | Temp 98.0°F | Resp 18 | Ht 66.0 in | Wt 245.2 lb

## 2023-03-17 DIAGNOSIS — F3341 Major depressive disorder, recurrent, in partial remission: Secondary | ICD-10-CM

## 2023-03-17 NOTE — Progress Notes (Signed)
   Office Visit  Subjective   Patient ID: Albert Benjamin   DOB: 1950/07/22   Age: 73 y.o.   MRN: [id number]   Chief Complaint Chief Complaint  Patient presents with   Anxiety    Anxiety,depression     History of Present Illness 73 years old male who is here for worsening depression. He has been using citalopram 40 mg for 20 years but he has surgery 2 weeks ago and his depression came back. He denies any suicidal ideation. He says he has noticed for sometime that citalopram is not working. He also clonazepam as well. He has back surgery.   Past Medical History Past Medical History:  Diagnosis Date   Allergic rhinitis    Aphakia, left eye 02/03/2020   Secondary insertion of posterior chamber intraocular lens via Yamani scleral tunnel technique 02/02/2020 left eye   Arthritis    Cataract    bilkateral surgery 3-40 years ago   Chronic pain syndrome    Depression    Erectile dysfunction    GAD (generalized anxiety disorder)    Hyperlipidemia    Hypertension    Lumbar stenosis with neurogenic claudication    Obstructive sleep apnea on CPAP    T2DM (type 2 diabetes mellitus) (HCC)    Vitreous prolapse, left eye 01/11/2020   Wears glasses    Wears hearing aid    both ears     Allergies Allergies  Allergen Reactions   Flexeril [Cyclobenzaprine] Other (See Comments)    Hallucinations-reported per patient's account     Review of Systems Review of Systems  Constitutional: Negative.   Respiratory: Negative.    Cardiovascular: Negative.   Psychiatric/Behavioral:  Positive for depression.        Objective:    Vitals BP 126/78 (BP Location: Left Arm, Patient Position: Sitting, Cuff Size: Normal)   Pulse 77   Temp 98 F (36.7 C)   Resp 18   Ht 5\' 6"  (1.676 m)   Wt 245 lb 4 oz (111.2 kg)   SpO2 96%   BMI 39.58 kg/m    Physical Examination Physical Exam Constitutional:      Appearance: Normal appearance.  Cardiovascular:     Rate and Rhythm: Normal rate and regular  rhythm.     Heart sounds: Normal heart sounds.  Pulmonary:     Effort: Pulmonary effort is normal.     Breath sounds: Normal breath sounds.  Neurological:     Mental Status: He is alert.        Assessment & Plan:   Recurrent major depressive disorder, in partial remission (HCC) He has been on citalopram 40 mg daily and I will add Vraylar 1.5 mg daily. If he has any side effects then he will call    No follow-ups on file.   Eloisa Northern, MD

## 2023-03-17 NOTE — Assessment & Plan Note (Signed)
 He has been on citalopram 40 mg daily and I will add Vraylar 1.5 mg daily. If he has any side effects then he will call

## 2023-03-20 DIAGNOSIS — R0981 Nasal congestion: Secondary | ICD-10-CM | POA: Diagnosis not present

## 2023-03-20 DIAGNOSIS — R059 Cough, unspecified: Secondary | ICD-10-CM | POA: Diagnosis not present

## 2023-03-22 ENCOUNTER — Other Ambulatory Visit: Payer: Self-pay | Admitting: Internal Medicine

## 2023-04-01 DIAGNOSIS — M5416 Radiculopathy, lumbar region: Secondary | ICD-10-CM | POA: Diagnosis not present

## 2023-04-01 DIAGNOSIS — M4310 Spondylolisthesis, site unspecified: Secondary | ICD-10-CM | POA: Diagnosis not present

## 2023-04-09 DIAGNOSIS — H43391 Other vitreous opacities, right eye: Secondary | ICD-10-CM | POA: Diagnosis not present

## 2023-04-09 DIAGNOSIS — H43311 Vitreous membranes and strands, right eye: Secondary | ICD-10-CM | POA: Diagnosis not present

## 2023-04-09 DIAGNOSIS — T8529XA Other mechanical complication of intraocular lens, initial encounter: Secondary | ICD-10-CM | POA: Diagnosis not present

## 2023-04-21 ENCOUNTER — Other Ambulatory Visit: Payer: Self-pay | Admitting: Internal Medicine

## 2023-04-28 ENCOUNTER — Encounter: Payer: Self-pay | Admitting: Internal Medicine

## 2023-04-28 ENCOUNTER — Ambulatory Visit: Payer: PPO | Admitting: Internal Medicine

## 2023-04-28 VITALS — BP 144/78 | HR 68 | Temp 97.8°F | Resp 18 | Ht 66.0 in | Wt 250.4 lb

## 2023-04-28 DIAGNOSIS — R3911 Hesitancy of micturition: Secondary | ICD-10-CM | POA: Diagnosis not present

## 2023-04-28 DIAGNOSIS — I10 Essential (primary) hypertension: Secondary | ICD-10-CM

## 2023-04-28 DIAGNOSIS — F411 Generalized anxiety disorder: Secondary | ICD-10-CM | POA: Diagnosis not present

## 2023-04-28 DIAGNOSIS — F331 Major depressive disorder, recurrent, moderate: Secondary | ICD-10-CM | POA: Diagnosis not present

## 2023-04-28 DIAGNOSIS — E7800 Pure hypercholesterolemia, unspecified: Secondary | ICD-10-CM

## 2023-04-28 DIAGNOSIS — E113292 Type 2 diabetes mellitus with mild nonproliferative diabetic retinopathy without macular edema, left eye: Secondary | ICD-10-CM | POA: Diagnosis not present

## 2023-04-28 DIAGNOSIS — Z0000 Encounter for general adult medical examination without abnormal findings: Secondary | ICD-10-CM

## 2023-04-28 DIAGNOSIS — G894 Chronic pain syndrome: Secondary | ICD-10-CM

## 2023-04-28 DIAGNOSIS — J302 Other seasonal allergic rhinitis: Secondary | ICD-10-CM

## 2023-04-28 DIAGNOSIS — M48062 Spinal stenosis, lumbar region with neurogenic claudication: Secondary | ICD-10-CM

## 2023-04-28 DIAGNOSIS — Z6841 Body Mass Index (BMI) 40.0 and over, adult: Secondary | ICD-10-CM | POA: Insufficient documentation

## 2023-04-28 MED ORDER — BUPROPION HCL 75 MG PO TABS: 75.0000 mg | ORAL_TABLET | Freq: Two times a day (BID) | ORAL | 2 refills | Status: DC

## 2023-04-28 NOTE — Assessment & Plan Note (Signed)
 His depression has worsened.  He has moderate recurrent major depression.  I am going to continue on celexa  and add wellbutrin  to him.

## 2023-04-28 NOTE — Assessment & Plan Note (Signed)
 His BP is mildly elevated.  We will see what his BP is doing on his next visit.

## 2023-04-28 NOTE — Assessment & Plan Note (Signed)
 We will recheck his HgBA1c today.  His diabetic foot exam was abnormal and I wonder if he is just recovering from his back surgery.  We will follow this over time.  We will obtain some diabetic urine studies today.

## 2023-04-28 NOTE — Assessment & Plan Note (Signed)
 We will check his FLP with goal LDL <100.

## 2023-04-28 NOTE — Assessment & Plan Note (Signed)
 This is controlled, continue his current medications.

## 2023-04-28 NOTE — Assessment & Plan Note (Signed)
 Plan as above.

## 2023-04-28 NOTE — Assessment & Plan Note (Signed)
 He is s/p back surgery and denies any pain.  He will followup with neurosurgery as described.

## 2023-04-28 NOTE — Assessment & Plan Note (Signed)
 He will need permission from Dr. Gwendlyn Lemmings regarding exercise.  The patient has an appointment coming up in the next week or 2 with him.  I want the patient to eat healthy, slowly increase his activity and try to lose weight.

## 2023-04-28 NOTE — Assessment & Plan Note (Signed)
 He takes gabapentin  but is no longer on any pain meds s/p surgery.

## 2023-04-28 NOTE — Progress Notes (Signed)
 Preventive Screening-Counseling & Management    Albert Benjamin is a 73 year old Caucasian/White male who presents for his annual wellness exam. He is due for the following health maintenance studies: screening labs. This patient's past medical history Anxiety Disorder, Generalized, Diabetes Mellitus, Type II, Erectile Dysfunction, Hyperlipidemia, Hypertension, Benign Essential, and Rhinitis, Allergic.    He had a dilated diabetic eye exam by Dr. Garland Junk on 09/05/2022 and this showed mild non-proliferative diabetic retinopathy of his left eye without edema.  He had vitreous stranding this past year where Dr. Seward Dao did laser surgery.  He also has a history of a dislocated lense and glaucoma. H We did do a FIT test on 11/2019 and this was positive for blood. He underwent a colonoscopy on 12/08/2019 and this showed moderate sigmoid diverticulosis and internal hemorrhoids. Dr. Venice Gillis did not recommend another colonoscopy after this one in 2021.  His last colonoscopy was on 12/2011 and this demonstrated diverticulosis and internal hemorrhoids. His prior colonoscopy in 2010 showed polyps so Dr. Venice Gillis wants to repeat his colonoscopy in 2018 due to a history of adenomatous polyps in the past. He denies any problems with urination. He states he is not exercising regularly due to his low back pain. He does not smoke.  He does get yearly flu vaccines. He a zostavax shingles vaccine in 2014.  He did finish his shingrix  vaccine in 2024.  He had a prevnar 13 vaccine in 05/2016. He had the pneumovax 23 vaccine in 05/2021. He has had 3 COVID-19 vaccines with 1 booster. He does have a history of anxiety but no depression or memory loss.  The patient is not on an ASA.  The patient did see my partner on 03/17/2023 where he presented with worsening depression and anxiety.  He states this was worsened due to his recent back surgery where Dr. Ariel Begun kept him on citalopram  40mg  daily and add vraylar 1.5mg  daily.  This did help but the  patient states he cannot afford it.  He did wean himself off the vraylar.  Today, he states he does need something added to his celexa  as he has periods of feeling down.  He remains on clonazepam  but has not had to use it for weeks.  He denies any panic attacks.  He denies any problems with concentration, fatigue, feelings of worthless, helpless feeling, feelings of guilt, no weight loss, loss of pleasurable activives, no social withdrawal, insomnia and no SI/HI.     The was also admitted to [facility] from 02/14/2023 until 02/15/2023 where he underwent underwent XLIF L3-4 by Dr. Gwendlyn Lemmings for his chronic back pain.  Again, he has chronic back pain where he has lumbar stenosis with neurogenic claudication.  He did followup with them on 01/23/2023 and he told them that the ESI injection he previously underwent wore off after about 2 weeks.  He had both weakness/numbness in his left leg. He has underwent decompressive laminectomies in the past.  Today, the patient states his back pain is now resolved.  He has residual numbness of his left leg in his anterior thigh and left lateral lower leg and some weakness where he states he is "wobbly at times".  He did not need therapies.  The patient underwent decompressive surgery for lumbar stenosis with neurogenic claudication on 08/28/2020.  He was having bilateral lower extremity numbness and weakness that worsened with standing and ambulation associated with low back pain.  He had a MRI done in 2022 which showed evidence of severe  stenosis at L2-L3, L3-L4, L4-L5, and L5-S1 with chronic disc herniations at L4-L5 and L5-S1.  He underwent decompressive laminectomies at these sites.  He remains on gabapentin  300mg  po TID.  He has chronic back pain where he has seen the back and scoliosis clinic in the past.  He had a MRI and xrays which showed lumbar spondylosis and DDD.  He had moderate to severe central canal stenosis of L5-S1.  There was L4-L5 lateral recess stenosis with disc  protrusion.  There was large central L5-S1 disc protrusion with nerve impingement.  He underwent physical therapy in the past which did not help.  He has received 2 epidural steriod injections which have helped with the pain in the past.  I initially did an ABI as he was having pain radiating down his left leg.  His ABI was normal.  He denies any new loss of bowel/bladder and no new weakness/numbness.  He gets back pain when he walks too far.  He also has a history of surgery on his right knee where he was found to have a meniscial tear. Albert Benjamin has had surgery for cervical stenosis in 03/2012 and had cervical fusion with plating.  He was seen by ortho and neurosurgeon and underwent PT.  He states he cannot lift anything with his right hand more than 5 lbs.  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  He has atrophy of his RUE and there has been no change in his strength over the last year.   The patient is a 73 year old Caucasian/White male who returns for a follow-up visit for his T2 diabetes.  Since his last visit, he has not had any problems.  This past year, his A1c was borderline so started him on jardiance.  However, the patient states he had to stop this due to it causing nausea.  Also this past year, his HgBA1c was not controlled and I asked him to increased his actos  back to 30mg  daily.  The patient was placed on a trial off actos  this past year due to him losing weight by diet and exercise.  He had lost over 36 lbs over that time.  We had previously cut back his actos  from 30mg  to 15mg  daily but again stopped his actos .  We had to restart actos  where his diabetes subsequently became uncontrolled.  This past year his diabetes was still not controlled and we started him on mounjaro  but he had to stop this due to nausea and vomiting and we restarted him back on actos .  His current medications include:   Actos  30mg  daily.  He specifically denies unexplained abdominal pain, nausea, vomiting, or documented hypoglycemia.  He does not routinely check his blood sugars. He came in fasting today in anticipation of lab work. His last HgBA1c was done 3 months ago and was 7%. There is no diabetic complications of diabetic neuropathy, nephropathy, retinopathy or cardiovascular disease.   He had a dilated diabetic eye exam by Dr. Garland Junk on 09/05/2022 and this showed mild non-proliferative diabetic retinopathy of his left eye without edema.   The patient is a 73 year old Caucasian/White male who presents for a follow-up evaluation of hypertension.  On his last routine visit, his K+ level was elevated and we did start him on hydrochlorothiazide .  He was previously on lisinopril  but he stopped it in the past because his BP  was "normal" at home.  The patient has not been checking his blood pressure at home.  He is supposed to be on lisinopril  for kidney protection but during his surgeries his BP was low so he stopped it. He is currently on:  hydrochlorothiazide  12.5mg  daily.  The patient denies any headache, visual changes, dizziness, lightheadness, chest pain, shortness of breath, weakness/numbness, and edema. He reports there have been no other symptoms noted.  Albert Benjamin returns today for routine followup on his cholesterol. Overall, he states he is doing well and is without any complaints or problems at this time. He specifically denies abdominal pain, nausea, vomiting, diarrhea, myalgias, and fatigue. He remains on dietary management as well as pravastatin  40mg  po daily. He is fasting in anticipation for labs today.   He also has a history of allergies.  This usually affects him in the fall and spring.  He does zyrtec and he has had a steriod allergy shot in the past.  He also uses singulair  which helps.        Are there smokers in your home (other than you)? No  Risk Factors Current exercise habits:  as  above   Dietary issues discussed: none   Depression Screen (Note: if answer to either of the following is "Yes", a more complete depression screening is indicated)   Over the past two weeks, have you felt down, depressed or hopeless? No  Over the past two weeks, have you felt little interest or pleasure in doing things? No  Have you lost interest or pleasure in daily life? No  Do you often feel hopeless? No  Do you cry easily over simple problems? No  Activities of Daily Living In your present state of health, do you have any difficulty performing the following activities?:  Driving? No Managing money?  No Feeding yourself? No Getting from bed to chair? No Climbing a flight of stairs? No Preparing food and eating?: No Bathing or showering? No Getting dressed: No Getting to the toilet? No Using the toilet:No Moving around from place to place: No In the past year have you fallen or had a near fall?:No     Hearing Difficulties: No- wears hearing aids Do you often ask people to speak up or repeat themselves? Yes Do you experience ringing or noises in your ears? Yes Do you have difficulty understanding soft or whispered voices? No   Do you feel that you have a problem with memory? No  Do you often misplace items? No  Do you feel safe at home?  Yes  Cognitive Testing  Alert? Yes  Normal Appearance?Yes  Oriented to person? Yes  Place? Yes   Time? Yes  Recall of three objects?  Yes  Can perform simple calculations? Yes  Displays appropriate judgment?Yes  Can read the correct time from a watch face?Yes  HE SCORED A 30/30 ON HIS MMSE    Fall Risk Prevention  Any stairs in or around the home? Yes  If so, are there any without handrails? Yes  Home free of loose throw rugs in walkways, pet beds, electrical cords, etc? Yes  Adequate lighting in your home to reduce risk of falls? Yes  Use of a cane, walker or w/c? No    Time Up and Go  Was the test performed? Yes .   Length of time to ambulate 10 feet: 12 sec.   Gait slow and steady without use of assistive device    Advanced Directives have been discussed  with the patient? Yes   List the Names of Other Physician/Practitioners you currently use: Patient Care Team: Wayne Haines, MD as PCP - General (Internal Medicine) Murtis Arthur, OD (Optometry)    Past Medical History:  Diagnosis Date   Allergic rhinitis    Aphakia, left eye 02/03/2020   Secondary insertion of posterior chamber intraocular lens via Yamani scleral tunnel technique 02/02/2020 left eye   Arthritis    Cataract    bilkateral surgery 3-40 years ago   Chronic pain syndrome    Depression    Erectile dysfunction    GAD (generalized anxiety disorder)    Hyperlipidemia    Hypertension    Lumbar stenosis with neurogenic claudication    Obstructive sleep apnea on CPAP    T2DM (type 2 diabetes mellitus) (HCC)    Vitreous prolapse, left eye 01/11/2020   Wears glasses    Wears hearing aid    both ears    Past Surgical History:  Procedure Laterality Date   ANKLE FRACTURE SURGERY Right    ANTERIOR CERVICAL DECOMP/DISCECTOMY FUSION N/A 03/11/2012   Procedure: ANTERIOR CERVICAL DECOMPRESSION/DISCECTOMY FUSION 3 LEVELS;  Surgeon: Adelbert Adler, MD;  Location: [facility];  Service: Neurosurgery;  Laterality: N/A;  Anterior cervical decompression/fusion Cervical four-five, Cervical five-six, Cervical six-seven   ANTERIOR LATERAL LUMBAR FUSION WITH PERCUTANEOUS SCREW 1 LEVEL Left 02/14/2023   Procedure: LEFT LUMBAR THREE-FOUR ANTERIOR LATERAL LUMBAR FUSION WITH PERCUTANEOUS SCREW PLACEMENT;  Surgeon: Agustina Aldrich, MD;  Location: [facility] OR;  Service: Neurosurgery;  Laterality: Left;   COLONOSCOPY     EYE SURGERY     catarct bil   EYE SURGERY Right    01/2023- laser surgery for floater in eye   KNEE ARTHROSCOPY  01/07/2005   left, right 09/2011   LUMBAR LAMINECTOMY/DECOMPRESSION MICRODISCECTOMY N/A 08/28/2020   Procedure: Laminectomy  and Foraminotomy - Lumbar Two-Lumbar Three, Lumbar Three-Lumbar Four, Lumbar Four-Lumbar Five,  Lumbar Five-Sacral One;  Surgeon: Agustina Aldrich, MD;  Location: [facility] OR;  Service: Neurosurgery;  Laterality: N/A;   SHOULDER ARTHROSCOPY  2005,2010-   [id number]      Current Medications  Current Outpatient Medications  Medication Sig Dispense Refill   cetirizine (ZYRTEC) 10 MG tablet Take 10 mg by mouth daily.     citalopram  (CELEXA ) 40 MG tablet TAKE 1 TABLET BY MOUTH ONCE DAILY 90 tablet 1   clonazePAM  (KLONOPIN ) 0.5 MG tablet Take 1 tablet (0.5 mg total) by mouth daily as needed for anxiety. 30 tablet 2   gabapentin  (NEURONTIN ) 300 MG capsule Take 300 mg by mouth 3 (three) times daily.     glucose blood test strip Use as instructed 100 each 12   hydrochlorothiazide  (MICROZIDE ) 12.5 MG capsule Take 1 capsule by mouth once daily. 30 capsule 1   montelukast  (SINGULAIR ) 10 MG tablet Take 1 tablet by mouth once daily in the evening. 90 tablet 1   pioglitazone  (ACTOS ) 30 MG tablet Take 1 tablet (30 mg total) by mouth daily. 90 tablet 3   pravastatin  (PRAVACHOL ) 40 MG tablet TAKE ONE TABLET BY MOUTH AT BEDTIME 90 tablet 1   No current facility-administered medications for this visit.    Allergies Flexeril  [cyclobenzaprine ]   Social History Social History   Tobacco Use   Smoking status: Never   Smokeless tobacco: Never  Substance Use Topics   Alcohol  use: No     Review of Systems Review of Systems  Constitutional:  Negative for chills, fever and malaise/fatigue.  Eyes:  Negative for  blurred vision and double vision.  Respiratory:  Negative for cough, hemoptysis, shortness of breath and wheezing.   Cardiovascular:  Negative for chest pain, palpitations and leg swelling.  Gastrointestinal:  Negative for abdominal pain, blood in stool, constipation, diarrhea, heartburn, melena, nausea and vomiting.  Genitourinary:  Negative for frequency and hematuria.  Musculoskeletal:  Negative for back  pain and myalgias.  Skin:  Negative for itching and rash.  Neurological:  Negative for dizziness, weakness and headaches.  Endo/Heme/Allergies:  Negative for polydipsia.  Psychiatric/Behavioral:  Positive for depression. Negative for memory loss, substance abuse and suicidal ideas. The patient is not nervous/anxious and does not have insomnia.      Physical Exam:      Body mass index is 40.42 kg/m. BP (!) 144/78   Pulse 68   Temp 97.8 F (36.6 C) (Temporal)   Resp 18   Ht 5\' 6"  (1.676 m)   Wt 250 lb 6.4 oz (113.6 kg)   SpO2 99%   BMI 40.42 kg/m   Physical Exam   Assessment:      Mild nonproliferative diabetic retinopathy of left eye without macular edema associated with type 2 diabetes mellitus (HCC)  Essential hypertension  GAD (generalized anxiety disorder)  Lumbar stenosis with neurogenic claudication  Hypercholesterolemia  Chronic pain syndrome  Seasonal allergies  Moderate episode of recurrent major depressive disorder (HCC)  BMI 40.0-44.9, adult (HCC)  Morbid obesity (HCC)  Urinary hesitancy    Plan:     During the course of the visit the patient was educated and counseled about appropriate screening and preventive services including:   Pneumococcal vaccine  Influenza vaccine Colorectal cancer screening Advanced directives: DISCUSSED  Diet review for nutrition referral? Yes ____  Not Indicated ___X_   Patient Instructions (the written plan) was given to the patient.  Essential hypertension His BP is mildly elevated.  We will see what his BP is doing on his next visit.  Mild nonproliferative diabetic retinopathy of left eye without macular edema associated with type 2 diabetes mellitus (HCC) We will recheck his HgBA1c today.  His diabetic foot exam was abnormal and I wonder if he is just recovering from his back surgery.  We will follow this over time.  We will obtain some diabetic urine studies today.  Seasonal allergies This is  controlled, continue his current medications.  Morbid obesity (HCC) He will need permission from Dr. Gwendlyn Lemmings regarding exercise.  The patient has an appointment coming up in the next week or 2 with him.  I want the patient to eat healthy, slowly increase his activity and try to lose weight.  Moderate episode of recurrent major depressive disorder (HCC) His depression has worsened.  He has moderate recurrent major depression.  I am going to continue on celexa  and add wellbutrin  to him.  Lumbar stenosis with neurogenic claudication He is s/p back surgery and denies any pain.  He will followup with neurosurgery as described.  Hypercholesterolemia We will check his FLP with goal LDL <100.  GAD (generalized anxiety disorder) Plan as above.  Chronic pain syndrome He takes gabapentin  but is no longer on any pain meds s/p surgery.  BMI 40.0-44.9, adult [facility]) Plan as above.   Prevention Health maintenance was discussed.  We will obtain some yearly labs.     Medicare Attestation I have personally reviewed: The patient's medical and social history Their use of alcohol , tobacco or illicit drugs Their current medications and supplements The patient's functional ability including ADLs,fall risks, home safety  risks, cognitive, and hearing and visual impairment Diet and physical activities Evidence for depression or mood disorders  The patient's weight, height, and BMI have been recorded in the chart.  I have made referrals, counseling, and provided education to the patient based on review of the above and I have provided the patient with a written personalized care plan for preventive services.     Wayne Haines, MD   04/28/2023

## 2023-04-29 LAB — LIPID PANEL
Chol/HDL Ratio: 2.7 ratio (ref 0.0–5.0)
Cholesterol, Total: 132 mg/dL (ref 100–199)
HDL: 49 mg/dL (ref >39)
LDL Chol Calc (NIH): 61 mg/dL (ref 0–99)
Triglycerides: 127 mg/dL (ref 0–149)
VLDL Cholesterol Cal: 22 mg/dL (ref 5–40)

## 2023-04-29 LAB — CBC WITH DIFFERENTIAL/PLATELET
Basophils Absolute: 0 10*3/uL (ref 0.0–0.2)
Basos: 0 %
EOS (ABSOLUTE): 0.1 10*3/uL (ref 0.0–0.4)
Eos: 2 %
Hematocrit: 42.5 % (ref 37.5–51.0)
Hemoglobin: 13.8 g/dL (ref 13.0–17.7)
Immature Grans (Abs): 0 10*3/uL (ref 0.0–0.1)
Immature Granulocytes: 0 %
Lymphocytes Absolute: 1.1 10*3/uL (ref 0.7–3.1)
Lymphs: 23 %
MCH: 30.8 pg (ref 26.6–33.0)
MCHC: 32.5 g/dL (ref 31.5–35.7)
MCV: 95 fL (ref 79–97)
Monocytes Absolute: 0.4 10*3/uL (ref 0.1–0.9)
Monocytes: 9 %
Neutrophils Absolute: 2.9 10*3/uL (ref 1.4–7.0)
Neutrophils: 66 %
Platelets: 168 10*3/uL (ref 150–450)
RBC: 4.48 x10E6/uL (ref 4.14–5.80)
RDW: 13.2 % (ref 11.6–15.4)
WBC: 4.5 10*3/uL (ref 3.4–10.8)

## 2023-04-29 LAB — CMP14 + ANION GAP
ALT: 13 IU/L (ref 0–44)
AST: 16 IU/L (ref 0–40)
Albumin: 4.2 g/dL (ref 3.8–4.8)
Alkaline Phosphatase: 96 IU/L (ref 44–121)
Anion Gap: 15 mmol/L (ref 10.0–18.0)
BUN/Creatinine Ratio: 20 (ref 10–24)
BUN: 23 mg/dL (ref 8–27)
Bilirubin Total: 0.4 mg/dL (ref 0.0–1.2)
CO2: 25 mmol/L (ref 20–29)
Calcium: 9.1 mg/dL (ref 8.6–10.2)
Chloride: 105 mmol/L (ref 96–106)
Creatinine, Ser: 1.15 mg/dL (ref 0.76–1.27)
Globulin, Total: 2.5 g/dL (ref 1.5–4.5)
Glucose: 104 mg/dL — ABNORMAL HIGH (ref 70–99)
Potassium: 5 mmol/L (ref 3.5–5.2)
Sodium: 145 mmol/L — ABNORMAL HIGH (ref 134–144)
Total Protein: 6.7 g/dL (ref 6.0–8.5)
eGFR: 67 mL/min/{1.73_m2} (ref >59)

## 2023-04-29 LAB — MICROALBUMIN / CREATININE URINE RATIO
Creatinine, Urine: 72 mg/dL
Microalb/Creat Ratio: 22 mg/g{creat} (ref 0–29)
Microalbumin, Urine: 15.6 ug/mL

## 2023-04-29 LAB — HEMOGLOBIN A1C
Est. average glucose Bld gHb Est-mCnc: 137 mg/dL
Hgb A1c MFr Bld: 6.4 % — ABNORMAL HIGH (ref 4.8–5.6)

## 2023-04-29 LAB — PSA: Prostate Specific Ag, Serum: 0.2 ng/mL (ref 0.0–4.0)

## 2023-04-29 LAB — TSH: TSH: 2.17 u[IU]/mL (ref 0.450–4.500)

## 2023-05-07 DIAGNOSIS — Z6838 Body mass index (BMI) 38.0-38.9, adult: Secondary | ICD-10-CM | POA: Diagnosis not present

## 2023-05-07 DIAGNOSIS — M4310 Spondylolisthesis, site unspecified: Secondary | ICD-10-CM | POA: Diagnosis not present

## 2023-05-07 NOTE — Progress Notes (Signed)
 His labs look good.  Patient aware

## 2023-05-21 ENCOUNTER — Ambulatory Visit: Admitting: Internal Medicine

## 2023-05-21 ENCOUNTER — Encounter: Payer: Self-pay | Admitting: Internal Medicine

## 2023-05-21 VITALS — BP 118/68 | HR 73 | Temp 98.1°F | Resp 18 | Ht 66.0 in | Wt 243.0 lb

## 2023-05-21 DIAGNOSIS — F331 Major depressive disorder, recurrent, moderate: Secondary | ICD-10-CM | POA: Diagnosis not present

## 2023-05-21 DIAGNOSIS — F411 Generalized anxiety disorder: Secondary | ICD-10-CM

## 2023-05-21 MED ORDER — CLONAZEPAM 0.5 MG PO TABS: 0.5000 mg | ORAL_TABLET | Freq: Three times a day (TID) | ORAL | 0 refills | Status: DC | PRN

## 2023-05-21 NOTE — Progress Notes (Signed)
 Office Visit  Subjective   Patient ID: Albert Benjamin   DOB: Feb 06, 1950   Age: 73 y.o.   MRN: [id number]   Chief Complaint Chief Complaint  Patient presents with   Follow-up    Pt reports the Wellbutrin  is not helping.      History of Present Illness The patient returns today to discuss his wellbutrin  which was added 3 weeks ago for his worsening depression and anxiety.  He told me then that his depression worsened due to his recent back surgery where Dr. Ariel Begun kept him on citalopram  40mg  daily and add vraylar 1.5mg  daily.  Vrylar did help but the patient states he cannot afford it.  He did wean himself off the vraylar.  He is also on clonazepam  0.5mg  which he takes 2-3 times per day.  He states that today he has not noticed a difference with his depression or anxiety with the addition of wellbutrin . He continues to have periods of feeling down and sad.  He states he will get a sensation in his stomach and he will take clonazepam  and this disappears.  He remains on clonazepam  but has not had to use it for weeks.  He denies any panic attacks.  He denies any problems with concentration, fatigue, feelings of worthless, helpless feeling, feelings of guilt, no weight loss, loss of pleasurable activives, no social withdrawal, insomnia and no SI/HI.      Past Medical History Past Medical History:  Diagnosis Date   Allergic rhinitis    Aphakia, left eye 02/03/2020   Secondary insertion of posterior chamber intraocular lens via Yamani scleral tunnel technique 02/02/2020 left eye   Arthritis    Cataract    bilkateral surgery 3-40 years ago   Chronic pain syndrome    Depression    Erectile dysfunction    GAD (generalized anxiety disorder)    Hyperlipidemia    Hypertension    Lumbar stenosis with neurogenic claudication    Obstructive sleep apnea on CPAP    T2DM (type 2 diabetes mellitus) (HCC)    Vitreous prolapse, left eye 01/11/2020   Wears glasses    Wears hearing aid    both ears      Allergies Allergies  Allergen Reactions   Flexeril  [Cyclobenzaprine ] Other (See Comments)    Hallucinations-reported per patient's account     Medications  Current Outpatient Medications:    buPROPion  (WELLBUTRIN ) 75 MG tablet, Take 1 tablet (75 mg total) by mouth 2 (two) times daily., Disp: 60 tablet, Rfl: 2   cetirizine (ZYRTEC) 10 MG tablet, Take 10 mg by mouth daily., Disp: , Rfl:    citalopram  (CELEXA ) 40 MG tablet, TAKE 1 TABLET BY MOUTH ONCE DAILY, Disp: 90 tablet, Rfl: 1   clonazePAM  (KLONOPIN ) 0.5 MG tablet, Take 1 tablet (0.5 mg total) by mouth daily as needed for anxiety., Disp: 30 tablet, Rfl: 2   gabapentin  (NEURONTIN ) 300 MG capsule, Take 300 mg by mouth 3 (three) times daily., Disp: , Rfl:    glucose blood test strip, Use as instructed, Disp: 100 each, Rfl: 12   hydrochlorothiazide  (MICROZIDE ) 12.5 MG capsule, Take 1 capsule by mouth once daily., Disp: 30 capsule, Rfl: 1   montelukast  (SINGULAIR ) 10 MG tablet, Take 1 tablet by mouth once daily in the evening., Disp: 90 tablet, Rfl: 1   pioglitazone  (ACTOS ) 30 MG tablet, Take 1 tablet (30 mg total) by mouth daily., Disp: 90 tablet, Rfl: 3   pravastatin  (PRAVACHOL ) 40 MG tablet, TAKE ONE TABLET BY  MOUTH AT BEDTIME, Disp: 90 tablet, Rfl: 1   Review of Systems Review of Systems  Eyes:  Negative for blurred vision and double vision.  Respiratory:  Negative for shortness of breath.   Cardiovascular:  Negative for chest pain and palpitations.  Gastrointestinal:  Negative for constipation, diarrhea, nausea and vomiting.  Skin:  Negative for rash.  Neurological:  Negative for dizziness, weakness and headaches.  Psychiatric/Behavioral:  Negative for substance abuse. The patient does not have insomnia.        Objective:    Vitals BP 118/68   Pulse 73   Temp 98.1 F (36.7 C) (Oral)   Resp 18   Ht 5\' 6"  (1.676 m)   Wt 243 lb (110.2 kg)   SpO2 97%   BMI 39.22 kg/m    Physical Examination Physical  Exam Constitutional:      Appearance: Normal appearance. He is not ill-appearing.  Cardiovascular:     Rate and Rhythm: Normal rate and regular rhythm.     Pulses: Normal pulses.     Heart sounds: No murmur heard.    No friction rub. No gallop.  Pulmonary:     Effort: Pulmonary effort is normal. No respiratory distress.     Breath sounds: No wheezing, rhonchi or rales.  Abdominal:     General: Abdomen is flat. Bowel sounds are normal. There is no distension.     Palpations: Abdomen is soft.     Tenderness: There is no abdominal tenderness.  Musculoskeletal:     Right lower leg: No edema.     Left lower leg: No edema.  Skin:    General: Skin is warm and dry.     Findings: No rash.  Neurological:     Mental Status: He is alert.        Assessment & Plan:   Moderate episode of recurrent major depressive disorder (HCC) I have explained to him that it was take about 6 weeks before we see the full effect of wellbutrin .  I do not want to change his meds at this time but we will see him back in 3 weeks to reassess him.  I am going to increase his clonazepam  usage.  GAD (generalized anxiety disorder) Plan as above.    Return in about 3 weeks (around 06/11/2023).   Wayne Haines, MD

## 2023-05-21 NOTE — Assessment & Plan Note (Signed)
 I have explained to him that it was take about 6 weeks before we see the full effect of wellbutrin .  I do not want to change his meds at this time but we will see him back in 3 weeks to reassess him.  I am going to increase his clonazepam  usage.

## 2023-05-21 NOTE — Assessment & Plan Note (Signed)
 Plan as above.

## 2023-06-04 ENCOUNTER — Ambulatory Visit: Admitting: Internal Medicine

## 2023-06-04 ENCOUNTER — Encounter: Payer: Self-pay | Admitting: Internal Medicine

## 2023-06-04 VITALS — BP 128/70 | HR 94 | Temp 98.2°F | Resp 18 | Ht 66.0 in | Wt 240.0 lb

## 2023-06-04 DIAGNOSIS — F331 Major depressive disorder, recurrent, moderate: Secondary | ICD-10-CM | POA: Diagnosis not present

## 2023-06-04 DIAGNOSIS — F411 Generalized anxiety disorder: Secondary | ICD-10-CM

## 2023-06-04 MED ORDER — QUETIAPINE FUMARATE 50 MG PO TABS: 50.0000 mg | ORAL_TABLET | Freq: Every day | ORAL | 1 refills | Status: DC

## 2023-06-04 NOTE — Assessment & Plan Note (Signed)
 He states that citalopram  and vryalar helped but he could not afford the vraylar.  His depression and anxiety seems worse.  I am going to wean his wellbutrin  where I want him to take 75mg  once a day for a week then stop.  We will continue on citalopram  and add seroquel 50mg  at bedtime.  I will see him back in 6 weeks.

## 2023-06-04 NOTE — Progress Notes (Signed)
 Office Visit  Subjective   Patient ID: Albert Benjamin   DOB: 07-19-1950   Age: 73 y.o.   MRN: [id number]   Chief Complaint Chief Complaint  Patient presents with   Acute Visit    States medicine is not working     History of Present Illness The patient returns today to discuss depression.  I saw him about 5 weeks ago where we noted he had worsening depression and we add wellbutrin  75mg  BID to his regimen with citalopram  40mg .  He states that over the interim, he has not noticed the wellbutrin  helping and his depression has become even worse.  I saw him 2 weeks ago and we renewed clonazepam  from 0.5mg  2-3 times per day prn.  He states he takes it 1-2 times per day.  Again, his depression worsened after his back surgery where Dr. Ariel Begun has seen him and kept him on citalopram  40mg  daily and add vraylar 1.5mg  daily.  Vrylar did help but the patient states he cannot afford it.  He did wean himself off the vraylar.  He continues to have periods of feeling down and sad.  He states he will get a sensation in his stomach and he will take clonazepam  and this disappears.  He remains on clonazepam  but has not had to use it for weeks.  He denies any panic attacks.  He denies any problems with concentration, fatigue, feelings of worthless, helpless feeling, feelings of guilt, no weight loss, loss of pleasurable activives, no social withdrawal, insomnia and no SI/HI.      Past Medical History Past Medical History:  Diagnosis Date   Allergic rhinitis    Aphakia, left eye 02/03/2020   Secondary insertion of posterior chamber intraocular lens via Yamani scleral tunnel technique 02/02/2020 left eye   Arthritis    Cataract    bilkateral surgery 3-40 years ago   Chronic pain syndrome    Depression    Erectile dysfunction    GAD (generalized anxiety disorder)    Hyperlipidemia    Hypertension    Lumbar stenosis with neurogenic claudication    Obstructive sleep apnea on CPAP    T2DM (type 2 diabetes  mellitus) (HCC)    Vitreous prolapse, left eye 01/11/2020   Wears glasses    Wears hearing aid    both ears     Allergies Allergies  Allergen Reactions   Flexeril  [Cyclobenzaprine ] Other (See Comments)    Hallucinations-reported per patient's account     Medications  Current Outpatient Medications:    buPROPion  (WELLBUTRIN ) 75 MG tablet, Take 1 tablet (75 mg total) by mouth 2 (two) times daily., Disp: 60 tablet, Rfl: 2   cetirizine (ZYRTEC) 10 MG tablet, Take 10 mg by mouth daily., Disp: , Rfl:    citalopram  (CELEXA ) 40 MG tablet, TAKE 1 TABLET BY MOUTH ONCE DAILY, Disp: 90 tablet, Rfl: 1   clonazePAM  (KLONOPIN ) 0.5 MG tablet, Take 1 tablet (0.5 mg total) by mouth 3 (three) times daily as needed for anxiety., Disp: 60 tablet, Rfl: 0   gabapentin  (NEURONTIN ) 300 MG capsule, Take 300 mg by mouth 3 (three) times daily., Disp: , Rfl:    glucose blood test strip, Use as instructed, Disp: 100 each, Rfl: 12   hydrochlorothiazide  (MICROZIDE ) 12.5 MG capsule, Take 1 capsule by mouth once daily., Disp: 30 capsule, Rfl: 1   montelukast  (SINGULAIR ) 10 MG tablet, Take 1 tablet by mouth once daily in the evening., Disp: 90 tablet, Rfl: 1   pioglitazone  (ACTOS )  30 MG tablet, Take 1 tablet (30 mg total) by mouth daily., Disp: 90 tablet, Rfl: 3   pravastatin  (PRAVACHOL ) 40 MG tablet, TAKE ONE TABLET BY MOUTH AT BEDTIME, Disp: 90 tablet, Rfl: 1   Review of Systems Review of Systems  Constitutional:  Negative for chills and fever.  Eyes:  Negative for blurred vision and double vision.  Respiratory:  Negative for shortness of breath.   Cardiovascular:  Negative for chest pain, palpitations and leg swelling.  Gastrointestinal:  Negative for abdominal pain, constipation, diarrhea, nausea and vomiting.  Neurological:  Negative for dizziness, weakness and headaches.  Psychiatric/Behavioral:  Negative for substance abuse and suicidal ideas.        Objective:    Vitals BP 128/70   Pulse 94   Temp  98.2 F (36.8 C)   Resp 18   Ht 5\' 6"  (1.676 m)   Wt 240 lb (108.9 kg)   SpO2 97%   BMI 38.74 kg/m    Physical Examination Physical Exam Constitutional:      Appearance: Normal appearance. He is not ill-appearing.  Cardiovascular:     Rate and Rhythm: Normal rate and regular rhythm.     Pulses: Normal pulses.     Heart sounds: No murmur heard.    No friction rub. No gallop.  Pulmonary:     Effort: Pulmonary effort is normal. No respiratory distress.     Breath sounds: No wheezing, rhonchi or rales.  Abdominal:     General: Abdomen is flat. Bowel sounds are normal. There is no distension.     Palpations: Abdomen is soft.     Tenderness: There is no abdominal tenderness.  Musculoskeletal:     Right lower leg: No edema.     Left lower leg: No edema.  Skin:    General: Skin is warm and dry.     Findings: No rash.  Neurological:     Mental Status: He is alert.  Psychiatric:        Mood and Affect: Mood normal.        Behavior: Behavior normal.        Assessment & Plan:   Moderate episode of recurrent major depressive disorder (HCC) He states that citalopram  and vryalar helped but he could not afford the vraylar.  His depression and anxiety seems worse.  I am going to wean his wellbutrin  where I want him to take 75mg  once a day for a week then stop.  We will continue on citalopram  and add seroquel 50mg  at bedtime.  I will see him back in 6 weeks.    Return in about 6 weeks (around 07/16/2023).   Wayne Haines, MD

## 2023-06-13 ENCOUNTER — Ambulatory Visit: Admitting: Internal Medicine

## 2023-06-23 ENCOUNTER — Other Ambulatory Visit: Payer: Self-pay | Admitting: Internal Medicine

## 2023-06-24 ENCOUNTER — Encounter: Payer: Self-pay | Admitting: Internal Medicine

## 2023-06-24 ENCOUNTER — Ambulatory Visit: Admitting: Internal Medicine

## 2023-06-24 VITALS — BP 120/76 | HR 75 | Temp 98.5°F | Resp 18 | Ht 66.0 in | Wt 245.2 lb

## 2023-06-24 DIAGNOSIS — F331 Major depressive disorder, recurrent, moderate: Secondary | ICD-10-CM | POA: Diagnosis not present

## 2023-06-24 MED ORDER — CLONAZEPAM 0.5 MG PO TABS: 0.5000 mg | ORAL_TABLET | Freq: Two times a day (BID) | ORAL | Status: DC | PRN

## 2023-06-24 MED ORDER — ARIPIPRAZOLE 5 MG PO TABS: 5.0000 mg | ORAL_TABLET | Freq: Every day | ORAL | 2 refills | Status: DC

## 2023-06-24 NOTE — Assessment & Plan Note (Signed)
 He is having some sleepiness.  I want him to cut his seroquel  down to 25mg  daily x 4 days, then 25mg  every other day x 4 days then stop.  He is to cut back his clonazepam  to BID prn.  We will continue on celexa  40mg  daily and add abilify 5mg  daily at this time.  I will see him back in 6-8 weeks.

## 2023-06-24 NOTE — Progress Notes (Signed)
 Office Visit  Subjective   Patient ID: Albert Benjamin   DOB: [birth year]   Age: 73 y.o.   MRN: [id number]   Chief Complaint Chief Complaint  Patient presents with   Anxiety    Pt in today for anxiety f/u. The patient reports he has been taking Celexa  for over 15 years, and has never had to add any anxiety medications. The patient reports he added Seroquel  and feels the anxiety is getting worse.      History of Present Illness Albert Benjamin is a 73 yo male who returns today to discuss his depression and anxiety.  I saw him 3 weeks ago where we had continued him on celexa  40mg  daily and added seroquel  50mg  at night and placed him on clonazepam  0.5mg  po 2-3 times per day.  He returns today with his wife who states she believes he is more depressed.  He sleeps all day and has no motivation.  I previously a few months ago where we had him on celexa  40mg  daily and wellbutrin  75mg  BID but this did not help.  We weaned off wellbutrin  and wanted to try him on vraylar but he could not afford this.  Again, his depression worsened after his back surgery where Albert Benjamin has seen him and kept him on citalopram  40mg  daily.  He continues to have periods of feeling down and sad.  He states he will get a sensation in his stomach and he will take clonazepam  and this disappears.  He denies any panic attacks.  He denies any problems with concentration, fatigue, feelings of worthless, helpless feeling, feelings of guilt, no weight loss, loss of pleasurable activives, no social withdrawal, insomnia and no SI/HI.     Past Medical History Past Medical History:  Diagnosis Date   Allergic rhinitis    Aphakia, left eye 02/03/2020   Secondary insertion of posterior chamber intraocular lens via Yamani scleral tunnel technique 02/02/2020 left eye   Arthritis    Cataract    bilkateral surgery 3-40 years ago   Chronic pain syndrome    Depression    Erectile dysfunction    GAD (generalized anxiety disorder)    Hyperlipidemia     Hypertension    Lumbar stenosis with neurogenic claudication    Obstructive sleep apnea on CPAP    T2DM (type 2 diabetes mellitus) (HCC)    Vitreous prolapse, left eye 01/11/2020   Wears glasses    Wears hearing aid    both ears     Allergies Allergies  Allergen Reactions   Flexeril  [Cyclobenzaprine ] Other (See Comments)    Hallucinations-reported per patient's account     Medications  Current Outpatient Medications:    cetirizine (ZYRTEC) 10 MG tablet, Take 10 mg by mouth daily., Disp: , Rfl:    citalopram  (CELEXA ) 40 MG tablet, TAKE 1 TABLET BY MOUTH ONCE DAILY, Disp: 90 tablet, Rfl: 1   clonazePAM  (KLONOPIN ) 0.5 MG tablet, Take 1 tablet (0.5 mg total) by mouth 3 (three) times daily as needed for anxiety., Disp: 60 tablet, Rfl: 0   gabapentin  (NEURONTIN ) 300 MG capsule, Take 300 mg by mouth 3 (three) times daily., Disp: , Rfl:    glucose blood test strip, Use as instructed, Disp: 100 each, Rfl: 12   hydrochlorothiazide  (MICROZIDE ) 12.5 MG capsule, Take 1 capsule by mouth once daily., Disp: 30 capsule, Rfl: 1   montelukast  (SINGULAIR ) 10 MG tablet, Take 1 tablet by mouth once daily in the evening., Disp: 90 tablet, Rfl: 1  pioglitazone  (ACTOS ) 30 MG tablet, Take 1 tablet (30 mg total) by mouth daily., Disp: 90 tablet, Rfl: 3   pravastatin  (PRAVACHOL ) 40 MG tablet, TAKE ONE TABLET BY MOUTH AT BEDTIME, Disp: 90 tablet, Rfl: 1   Review of Systems Review of Systems  Constitutional:  Negative for chills and fever.  Eyes:  Negative for blurred vision.  Respiratory:  Negative for shortness of breath.   Cardiovascular:  Negative for chest pain, palpitations and leg swelling.  Gastrointestinal:  Negative for constipation, diarrhea, nausea and vomiting.  Neurological:  Negative for dizziness and weakness.  Psychiatric/Behavioral:  Negative for substance abuse.        Objective:    Vitals BP 120/76   Pulse 75   Temp 98.5 F (36.9 C) (Temporal)   Resp 18   Ht 5' 6 (1.676  m)   Wt 245 lb 3.2 oz (111.2 kg)   SpO2 95%   BMI 39.58 kg/m    Physical Examination Physical Exam Constitutional:      Appearance: Normal appearance. He is not ill-appearing.   Cardiovascular:     Rate and Rhythm: Normal rate and regular rhythm.     Pulses: Normal pulses.     Heart sounds: No murmur heard.    No friction rub. No gallop.  Pulmonary:     Effort: Pulmonary effort is normal. No respiratory distress.     Breath sounds: No wheezing, rhonchi or rales.  Abdominal:     General: Abdomen is flat. Bowel sounds are normal. There is no distension.     Palpations: Abdomen is soft.     Tenderness: There is no abdominal tenderness.   Musculoskeletal:     Right lower leg: No edema.     Left lower leg: No edema.   Skin:    General: Skin is warm and dry.     Findings: No rash.   Neurological:     General: No focal deficit present.     Mental Status: He is alert and oriented to person, place, and time.   Psychiatric:        Mood and Affect: Mood and affect normal.        Speech: Speech normal.        Behavior: Behavior normal.     Comments: Appearance well kept.          Assessment & Plan:   Moderate episode of recurrent major depressive disorder (HCC) He is having some sleepiness.  I want him to cut his seroquel  down to 25mg  daily x 4 days, then 25mg  every other day x 4 days then stop.  He is to cut back his clonazepam  to BID prn.  We will continue on celexa  40mg  daily and add abilify 5mg  daily at this time.  I will see him back in 6-8 weeks.    Return in about 2 months (around 08/24/2023).   Wayne Haines, MD

## 2023-07-03 ENCOUNTER — Other Ambulatory Visit: Payer: Self-pay | Admitting: Internal Medicine

## 2023-07-03 DIAGNOSIS — M4310 Spondylolisthesis, site unspecified: Secondary | ICD-10-CM | POA: Diagnosis not present

## 2023-07-28 ENCOUNTER — Other Ambulatory Visit: Payer: Self-pay | Admitting: Internal Medicine

## 2023-07-29 ENCOUNTER — Ambulatory Visit: Admitting: Internal Medicine

## 2023-08-18 ENCOUNTER — Other Ambulatory Visit: Payer: Self-pay | Admitting: Internal Medicine

## 2023-08-22 ENCOUNTER — Ambulatory Visit: Admitting: Internal Medicine

## 2023-08-22 ENCOUNTER — Encounter: Payer: Self-pay | Admitting: Internal Medicine

## 2023-08-22 VITALS — BP 132/76 | HR 74 | Temp 98.1°F | Resp 18 | Ht 66.0 in | Wt 245.0 lb

## 2023-08-22 DIAGNOSIS — J0100 Acute maxillary sinusitis, unspecified: Secondary | ICD-10-CM | POA: Insufficient documentation

## 2023-08-22 DIAGNOSIS — J302 Other seasonal allergic rhinitis: Secondary | ICD-10-CM

## 2023-08-22 MED ORDER — AMOXICILLIN-POT CLAVULANATE 875-125 MG PO TABS: 1.0000 | ORAL_TABLET | Freq: Two times a day (BID) | ORAL | 0 refills | Status: AC

## 2023-08-22 MED ORDER — TRIAMCINOLONE ACETONIDE 40 MG/ML IJ SUSP
40.0000 mg | Freq: Once | INTRAMUSCULAR | Status: AC
  Administered 2023-08-22: 40 mg via INTRAMUSCULAR

## 2023-08-22 NOTE — Assessment & Plan Note (Addendum)
 The patient has sinusitis with seasonal allergies.  We will give him a shot of kenalog  and give him oral augmentin .  Continue supportive care.  His rapid COVID-19 test was negative.

## 2023-08-22 NOTE — Progress Notes (Addendum)
 Office Visit  Subjective   Patient ID: Albert Benjamin   DOB: 1950/12/05   Age: 73 y.o.   MRN: [id number]   Chief Complaint Chief Complaint  Patient presents with   Nasal Congestion    Pt in today for nasal congestion. The patient reports he has been feeling sick for a couple weeks but yesterday and this morning he started having yellow mucus.      History of Present Illness Albert Benjamin is a 73 yo male who comes in today with complaints of upper respiratory tract illness.  He states a week ago he started developing sinus congestion which was clear nasal drainage but this has now turned into yellow nasal discharge with post nasal drip.  He denies any headaches, myalgias, f/c, chest congestion, cough, wheezing, SOB, nausea, vomiting or diarrhea.  He has been taking an OTC antihistamine.  He states he has been having problems with his seasonal allergies.     Past Medical History Past Medical History:  Diagnosis Date   Allergic rhinitis    Aphakia, left eye 02/03/2020   Secondary insertion of posterior chamber intraocular lens via Yamani scleral tunnel technique 02/02/2020 left eye   Arthritis    Cataract    bilkateral surgery 3-40 years ago   Chronic pain syndrome    Depression    Erectile dysfunction    GAD (generalized anxiety disorder)    Hyperlipidemia    Hypertension    Lumbar stenosis with neurogenic claudication    Obstructive sleep apnea on CPAP    T2DM (type 2 diabetes mellitus) (HCC)    Vitreous prolapse, left eye 01/11/2020   Wears glasses    Wears hearing aid    both ears     Allergies Allergies  Allergen Reactions   Flexeril  [Cyclobenzaprine ] Other (See Comments)    Hallucinations-reported per patient's account     Medications  Current Outpatient Medications:    amoxicillin -clavulanate (AUGMENTIN ) 875-125 MG tablet, Take 1 tablet by mouth 2 (two) times daily for 10 days., Disp: 20 tablet, Rfl: 0   ARIPiprazole  (ABILIFY ) 5 MG tablet, Take 1 tablet (5 mg  total) by mouth daily., Disp: 30 tablet, Rfl: 2   cetirizine (ZYRTEC) 10 MG tablet, Take 10 mg by mouth daily., Disp: , Rfl:    citalopram  (CELEXA ) 40 MG tablet, TAKE 1 TABLET BY MOUTH ONCE DAILY, Disp: 90 tablet, Rfl: 1   clonazePAM  (KLONOPIN ) 0.5 MG tablet, Take 1 tablet (0.5 mg total) by mouth 2 (two) times daily as needed for anxiety., Disp: , Rfl:    gabapentin  (NEURONTIN ) 300 MG capsule, Take 300 mg by mouth 3 (three) times daily., Disp: , Rfl:    glucose blood test strip, Use as instructed, Disp: 100 each, Rfl: 12   hydrochlorothiazide  (MICROZIDE ) 12.5 MG capsule, Take 1 capsule by mouth once daily., Disp: 30 capsule, Rfl: 1   montelukast  (SINGULAIR ) 10 MG tablet, Take 1 tablet by mouth once daily in the evening., Disp: 90 tablet, Rfl: 1   pioglitazone  (ACTOS ) 30 MG tablet, Take 1 tablet (30 mg total) by mouth daily., Disp: 90 tablet, Rfl: 3   pravastatin  (PRAVACHOL ) 40 MG tablet, TAKE ONE TABLET BY MOUTH AT BEDTIME, Disp: 90 tablet, Rfl: 1  Current Facility-Administered Medications:    triamcinolone  acetonide (KENALOG -40) injection 40 mg, 40 mg, Intramuscular, Once,    Review of Systems Review of Systems  Constitutional:  Negative for chills.  Respiratory:  Negative for cough, sputum production, shortness of breath and wheezing.   Cardiovascular:  Negative for chest pain.  Gastrointestinal:  Negative for abdominal pain, diarrhea, nausea and vomiting.  Musculoskeletal:  Negative for myalgias.  Skin:  Negative for rash.  Neurological:  Negative for dizziness, weakness and headaches.       Objective:    Vitals BP 132/76   Pulse 74   Temp 98.1 F (36.7 C) (Temporal)   Resp 18   Ht 5' 6 (1.676 m)   Wt 245 lb (111.1 kg)   SpO2 97%   BMI 39.54 kg/m    Physical Examination Physical Exam Constitutional:      Appearance: Normal appearance. He is not ill-appearing.  HENT:     Right Ear: Tympanic membrane, ear canal and external ear normal.     Left Ear: Tympanic membrane,  ear canal and external ear normal.     Nose: Nose normal. No congestion or rhinorrhea.     Mouth/Throat:     Mouth: Mucous membranes are moist.     Pharynx: Oropharynx is clear. No oropharyngeal exudate or posterior oropharyngeal erythema.  Cardiovascular:     Rate and Rhythm: Normal rate and regular rhythm.     Pulses: Normal pulses.     Heart sounds: No murmur heard.    No friction rub. No gallop.  Pulmonary:     Effort: Pulmonary effort is normal. No respiratory distress.     Breath sounds: No wheezing, rhonchi or rales.  Abdominal:     General: Abdomen is flat. Bowel sounds are normal. There is no distension.     Palpations: Abdomen is soft.     Tenderness: There is no abdominal tenderness.  Musculoskeletal:     Right lower leg: No edema.     Left lower leg: No edema.  Skin:    General: Skin is warm and dry.     Findings: No rash.  Neurological:     Mental Status: He is alert.        Assessment & Plan:   Acute non-recurrent maxillary sinusitis The patient has sinusitis with seasonal allergies.  We will give him a shot of kenalog  and give him oral augmentin .  Continue supportive care.  His rapid COVID-19 test was negative.  Seasonal allergies Continue on zytec and we will give him a kenalog  injection.    No follow-ups on file.   Selinda Fleeta Finger, MD

## 2023-08-22 NOTE — Assessment & Plan Note (Signed)
 Continue on zytec and we will give him a kenalog  injection.

## 2023-08-27 ENCOUNTER — Other Ambulatory Visit: Payer: Self-pay | Admitting: Internal Medicine

## 2023-09-01 ENCOUNTER — Other Ambulatory Visit: Payer: Self-pay | Admitting: Internal Medicine

## 2023-09-01 MED ORDER — CLONAZEPAM 0.5 MG PO TABS: 0.5000 mg | ORAL_TABLET | Freq: Two times a day (BID) | ORAL | Status: DC | PRN

## 2023-09-01 MED ORDER — CLONAZEPAM 0.5 MG PO TABS: 0.5000 mg | ORAL_TABLET | Freq: Two times a day (BID) | ORAL | 2 refills | Status: AC | PRN

## 2023-09-09 ENCOUNTER — Ambulatory Visit: Admitting: Internal Medicine

## 2023-09-09 ENCOUNTER — Encounter: Payer: Self-pay | Admitting: Internal Medicine

## 2023-09-09 VITALS — BP 128/76 | HR 75 | Temp 97.8°F | Resp 20 | Ht 66.0 in | Wt 241.2 lb

## 2023-09-09 DIAGNOSIS — I10 Essential (primary) hypertension: Secondary | ICD-10-CM

## 2023-09-09 DIAGNOSIS — F411 Generalized anxiety disorder: Secondary | ICD-10-CM

## 2023-09-09 DIAGNOSIS — E119 Type 2 diabetes mellitus without complications: Secondary | ICD-10-CM | POA: Insufficient documentation

## 2023-09-09 DIAGNOSIS — F330 Major depressive disorder, recurrent, mild: Secondary | ICD-10-CM | POA: Diagnosis not present

## 2023-09-09 NOTE — Assessment & Plan Note (Signed)
 Plan as above.

## 2023-09-09 NOTE — Assessment & Plan Note (Signed)
 He has had a history of mild non-proliferative diabetic retinopathy of his left eye in the past but I have reviewed his eye reports and there is no mention of retinopathy.  They want him to be seen in 1 year time for repeat eye exam.  His diabetes has been controlled.  We will check his HgBa1c on his next visit.

## 2023-09-09 NOTE — Assessment & Plan Note (Signed)
 His BP is controlled and we will continue on hydrochlorothiazide .

## 2023-09-09 NOTE — Progress Notes (Signed)
 Office Visit  Subjective   Patient ID: Albert Benjamin   DOB: [birth year]   Age: 73 y.o.   MRN: [id number]   Chief Complaint Chief Complaint  Patient presents with   Follow-up    Follow up     History of Present Illness Albert Benjamin is a 73 yo male who returns today to discuss his depression and anxiety.  I saw him about 2 months ago where his depression and anxiety had worsened.  I weaned him off seroquel  at that time and continued on celexa  40mg  daily and added abilify  5mg  daily.  I also wanted him to cut his clonazepam  where he now states he is taking this once every 2-3 weeks.   Overall he states his depression and anxiety are improved.  He does not want to go up on his dose of abilify  at this time.    I previously a few months ago where we had him on celexa  40mg  daily and wellbutrin  75mg  BID but this did not help.  We weaned off wellbutrin  and wanted to try him on vraylar but he could not afford this.  Again, his depression worsened after his back surgery where Albert Benjamin has seen him and kept him on citalopram  40mg  daily.  He continues to have periods of feeling down and sad.  He states he will get a sensation in his stomach and he will take clonazepam  and this disappears.  He denies any panic attacks.  He denies any problems with concentration, fatigue, feelings of worthless, helpless feeling, feelings of guilt, no weight loss, loss of pleasurable activives, no social withdrawal, insomnia and no SI/HI.  Moderate episode of recurrent major depressive disorder (HCC) He is having some sleepiness.  I want him to cut his seroquel  down to 25mg  daily x 4 days, then 25mg  every other day x 4 days then stop.  He is to cut back his clonazepam  to BID prn.  We will continue on celexa  40mg  daily and add abilify  5mg  daily at this time.  I will see him back in 6-8 weeks.  The patient is a 73 year old Caucasian/White male who returns for a follow-up visit for his T2 diabetes.  Since his last visit, he has not had  any problems.  This past year, his A1c was borderline so started him on jardiance.  However, the patient states he had to stop this due to it causing nausea.  Also this past year, his HgBA1c was not controlled and I asked him to increased his actos  back to 30mg  daily.  The patient was placed on a trial off actos  this past year due to him losing weight by diet and exercise.  He had lost over 36 lbs over that time.  We had previously cut back his actos  from 30mg  to 15mg  daily but again stopped his actos .  We had to restart actos  where his diabetes subsequently became uncontrolled.  This past year his diabetes was still not controlled and we started him on mounjaro  but he had to stop this due to nausea and vomiting and we restarted him back on actos .  His current medications include:  Actos  30mg  daily.  He specifically denies unexplained abdominal pain, nausea, vomiting, or documented hypoglycemia.  He does not routinely check his blood sugars. He came in fasting today in anticipation of lab work. His last HgBA1c was done 4 months ago and was 6.4%. There is no diabetic complications of diabetic neuropathy, nephropathy, retinopathy or cardiovascular disease.  He had a dilated diabetic eye exam by Albert Benjamin on 09/05/2022 and this showed mild non-proliferative diabetic retinopathy of his left eye without edema.  He had a dilated eye exam done by Albert Benjamin who is a retinal specialist on 01/22/2023 and again on 04/09/2023 and there was no evidence of diabetic retinopathy.  He is following him for vitreous membranes.     The patient is a 73 year old Caucasian/White male who presents for a follow-up evaluation of hypertension.  On his last routine visit, his K+ level was elevated and we did start him on hydrochlorothiazide .  He was previously on lisinopril  but he stopped it in the past because his BP was normal at home.  The patient has not been checking his blood pressure at home.  He is supposed to be on lisinopril  for  kidney protection but during his surgeries his BP was low so he stopped it. He is currently on:  hydrochlorothiazide  12.5mg  daily.  The patient denies any headache, visual changes, dizziness, lightheadness, chest pain, shortness of breath, weakness/numbness, and edema. He reports there have been no other symptoms noted.      Past Medical History Past Medical History:  Diagnosis Date   Allergic rhinitis    Aphakia, left eye 02/03/2020   Secondary insertion of posterior chamber intraocular lens via Yamani scleral tunnel technique 02/02/2020 left eye   Arthritis    Cataract    bilkateral surgery 3-40 years ago   Chronic pain syndrome    Depression    Erectile dysfunction    GAD (generalized anxiety disorder)    Hyperlipidemia    Hypertension    Lumbar stenosis with neurogenic claudication    Obstructive sleep apnea on CPAP    T2DM (type 2 diabetes mellitus) (HCC)    Vitreous prolapse, left eye 01/11/2020   Wears glasses    Wears hearing aid    both ears     Allergies Allergies  Allergen Reactions   Flexeril  [Cyclobenzaprine ] Other (See Comments)    Hallucinations-reported per patient's account     Medications  Current Outpatient Medications:    ARIPiprazole  (ABILIFY ) 5 MG tablet, Take 1 tablet (5 mg total) by mouth daily., Disp: 30 tablet, Rfl: 2   cetirizine (ZYRTEC) 10 MG tablet, Take 10 mg by mouth daily., Disp: , Rfl:    citalopram  (CELEXA ) 40 MG tablet, TAKE 1 TABLET BY MOUTH ONCE DAILY, Disp: 90 tablet, Rfl: 1   clonazePAM  (KLONOPIN ) 0.5 MG tablet, Take 1 tablet (0.5 mg total) by mouth 2 (two) times daily as needed for anxiety., Disp: 60 tablet, Rfl: 2   gabapentin  (NEURONTIN ) 300 MG capsule, Take 300 mg by mouth 3 (three) times daily., Disp: , Rfl:    glucose blood test strip, Use as instructed, Disp: 100 each, Rfl: 12   hydrochlorothiazide  (MICROZIDE ) 12.5 MG capsule, Take 1 capsule by mouth once daily., Disp: 30 capsule, Rfl: 1   montelukast  (SINGULAIR ) 10 MG  tablet, Take 1 tablet by mouth once daily in the evening., Disp: 90 tablet, Rfl: 1   pioglitazone  (ACTOS ) 30 MG tablet, Take 1 tablet (30 mg total) by mouth daily., Disp: 90 tablet, Rfl: 3   pravastatin  (PRAVACHOL ) 40 MG tablet, TAKE ONE TABLET BY MOUTH AT BEDTIME, Disp: 90 tablet, Rfl: 1   Review of Systems Review of Systems  Constitutional:  Negative for chills and fever.  Eyes:  Negative for blurred vision and double vision.  Respiratory:  Negative for cough and shortness of breath.   Cardiovascular:  Negative for chest pain, palpitations and leg swelling.  Gastrointestinal:  Negative for abdominal pain, constipation, diarrhea, nausea and vomiting.  Genitourinary:  Negative for frequency.  Musculoskeletal:  Negative for myalgias.  Skin:  Negative for itching and rash.  Neurological:  Negative for dizziness, weakness and headaches.  Endo/Heme/Allergies:  Negative for polydipsia.       Objective:    Vitals BP 128/76   Pulse 75   Temp 97.8 F (36.6 C) (Temporal)   Resp 20   Ht 5' 6 (1.676 m)   Wt 241 lb 3.2 oz (109.4 kg)   SpO2 95%   BMI 38.93 kg/m    Physical Examination Physical Exam Constitutional:      Appearance: Normal appearance. He is not ill-appearing.  Cardiovascular:     Rate and Rhythm: Normal rate and regular rhythm.     Pulses: Normal pulses.     Heart sounds: No murmur heard.    No friction rub. No gallop.  Pulmonary:     Effort: Pulmonary effort is normal. No respiratory distress.     Breath sounds: No wheezing, rhonchi or rales.  Abdominal:     General: Abdomen is flat. Bowel sounds are normal. There is no distension.     Palpations: Abdomen is soft.     Tenderness: There is no abdominal tenderness.  Musculoskeletal:     Right lower leg: No edema.     Left lower leg: No edema.  Skin:    General: Skin is warm and dry.     Findings: No rash.  Neurological:     General: No focal deficit present.     Mental Status: He is alert and oriented to  person, place, and time.  Psychiatric:        Mood and Affect: Mood normal.        Behavior: Behavior normal.        Assessment & Plan:   Diabetes mellitus without complication (HCC) He has had a history of mild non-proliferative diabetic retinopathy of his left eye in the past but I have reviewed his eye reports and there is no mention of retinopathy.  They want him to be seen in 1 year time for repeat eye exam.  His diabetes has been controlled.  We will check his HgBa1c on his next visit.  Essential hypertension His BP is controlled and we will continue on hydrochlorothiazide .  Mild episode of recurrent major depressive disorder (HCC) His depression and anxiety are doing better.  He has mild recurrent major depression.  Continue on celexa  and abilify .  GAD (generalized anxiety disorder) Plan as above.    Return in about 3 months (around 12/09/2023).   Selinda Fleeta Finger, MD

## 2023-09-09 NOTE — Assessment & Plan Note (Signed)
 His depression and anxiety are doing better.  He has mild recurrent major depression.  Continue on celexa  and abilify .

## 2023-09-15 ENCOUNTER — Other Ambulatory Visit: Payer: Self-pay | Admitting: Internal Medicine

## 2023-09-25 ENCOUNTER — Other Ambulatory Visit: Payer: Self-pay | Admitting: Internal Medicine

## 2023-09-29 ENCOUNTER — Other Ambulatory Visit: Payer: Self-pay

## 2023-09-29 MED ORDER — ARIPIPRAZOLE 5 MG PO TABS: 5.0000 mg | ORAL_TABLET | Freq: Every day | ORAL | 2 refills | Status: DC

## 2023-09-29 NOTE — Progress Notes (Signed)
 Rx refill

## 2023-10-07 ENCOUNTER — Ambulatory Visit: Admitting: Internal Medicine

## 2023-10-07 ENCOUNTER — Encounter: Payer: Self-pay | Admitting: Internal Medicine

## 2023-10-07 VITALS — BP 132/74 | HR 75 | Temp 97.8°F | Resp 16 | Ht 66.0 in | Wt 240.0 lb

## 2023-10-07 DIAGNOSIS — F331 Major depressive disorder, recurrent, moderate: Secondary | ICD-10-CM

## 2023-10-07 DIAGNOSIS — Z23 Encounter for immunization: Secondary | ICD-10-CM | POA: Diagnosis not present

## 2023-10-07 MED ORDER — ESCITALOPRAM OXALATE 20 MG PO TABS: 20.0000 mg | ORAL_TABLET | Freq: Every day | ORAL | 1 refills | Status: DC

## 2023-10-07 NOTE — Addendum Note (Signed)
 Addended by: VIDA SALINES on: 10/07/2023 03:51 PM   Modules accepted: Orders

## 2023-10-07 NOTE — Assessment & Plan Note (Signed)
 He states that the abilify  gives him no motivation and his younger son had this problem with abilify  in the past.  The patient wants to switch to lexapro where I had a discussion with him where lexapro is the S' isomer and cleaner version of celexa .  We will switch him over and discontinue his abilify .  He has been told he will need to give the medication 6 weeks to see what effect it has.

## 2023-10-07 NOTE — Progress Notes (Signed)
 Office Visit  Subjective   Patient ID: Albert Benjamin   DOB: 02-17-1950   Age: 73 y.o.   MRN: [id number]   Chief Complaint Chief Complaint  Patient presents with   Depression    Depression Medication     History of Present Illness Albert Benjamin is a 73 yo male who returns today to discuss his depression and anxiety where he thinks the abilify  is not working.  I saw him in 06/2023 where we discontinued his seroquel  and added abilify  5mg  daily to his celexa  40mg  daily.  Over the interim, he does not see where the abilify  is helping.  He also is on clonazepam  0.5mg  po BID prn which he takes maybe once per week.  I saw him about 5-6 months ago where his depression and anxiety had worsened.   I previously a few months ago where we had him on celexa  40mg  daily and wellbutrin  75mg  BID but this did not help.  We weaned off wellbutrin  and wanted to try him on vraylar but he could not afford this.  Again, his depression worsened after his back surgery where Dr. Caleen has seen him and kept him on citalopram  40mg  daily.  He continues to have periods of feeling down and sad.  He states he will get a sensation in his stomach and he will take clonazepam  and this disappears.  He denies any panic attacks.  He denies any problems with concentration, fatigue, feelings of worthless, helpless feeling, feelings of guilt, no weight loss, loss of pleasurable activives, no social withdrawal, insomnia and no SI/HI.       Past Medical History Past Medical History:  Diagnosis Date   Allergic rhinitis    Aphakia, left eye 02/03/2020   Secondary insertion of posterior chamber intraocular lens via Yamani scleral tunnel technique 02/02/2020 left eye   Arthritis    Cataract    bilkateral surgery 3-40 years ago   Chronic pain syndrome    Depression    Erectile dysfunction    GAD (generalized anxiety disorder)    Hyperlipidemia    Hypertension    Lumbar stenosis with neurogenic claudication    Obstructive sleep apnea on  CPAP    T2DM (type 2 diabetes mellitus) (HCC)    Vitreous prolapse, left eye 01/11/2020   Wears glasses    Wears hearing aid    both ears     Allergies Allergies  Allergen Reactions   Flexeril  [Cyclobenzaprine ] Other (See Comments)    Hallucinations-reported per patient's account     Medications  Current Outpatient Medications:    cetirizine (ZYRTEC) 10 MG tablet, Take 10 mg by mouth daily., Disp: , Rfl:    clonazePAM  (KLONOPIN ) 0.5 MG tablet, Take 1 tablet (0.5 mg total) by mouth 2 (two) times daily as needed for anxiety., Disp: 60 tablet, Rfl: 2   gabapentin  (NEURONTIN ) 300 MG capsule, Take 300 mg by mouth 3 (three) times daily., Disp: , Rfl:    glucose blood test strip, Use as instructed, Disp: 100 each, Rfl: 12   hydrochlorothiazide  (MICROZIDE ) 12.5 MG capsule, Take 1 capsule by mouth once daily., Disp: 30 capsule, Rfl: 1   montelukast  (SINGULAIR ) 10 MG tablet, Take 1 tablet by mouth once daily in the evening., Disp: 90 tablet, Rfl: 1   pioglitazone  (ACTOS ) 30 MG tablet, Take 1 tablet (30 mg total) by mouth daily., Disp: 90 tablet, Rfl: 3   pravastatin  (PRAVACHOL ) 40 MG tablet, TAKE ONE TABLET BY MOUTH AT BEDTIME, Disp: 90 tablet, Rfl:  1   Review of Systems Review of Systems  Constitutional:  Negative for chills and fever.  Eyes:  Negative for blurred vision and double vision.  Respiratory:  Negative for shortness of breath.   Cardiovascular:  Negative for chest pain.  Gastrointestinal:  Negative for abdominal pain, constipation, diarrhea, nausea and vomiting.  Neurological:  Negative for dizziness, weakness and headaches.  Psychiatric/Behavioral:  Negative for substance abuse.        Objective:    Vitals BP 132/74   Pulse 75   Temp 97.8 F (36.6 C)   Resp 16   Ht 5' 6 (1.676 m)   Wt 240 lb (108.9 kg)   SpO2 97%   BMI 38.74 kg/m    Physical Examination Physical Exam Constitutional:      Appearance: Normal appearance. He is not ill-appearing.   Cardiovascular:     Rate and Rhythm: Normal rate and regular rhythm.     Pulses: Normal pulses.     Heart sounds: No murmur heard.    No friction rub. No gallop.  Pulmonary:     Effort: Pulmonary effort is normal. No respiratory distress.     Breath sounds: No wheezing, rhonchi or rales.  Abdominal:     General: Abdomen is flat. Bowel sounds are normal. There is no distension.     Palpations: Abdomen is soft.     Tenderness: There is no abdominal tenderness.  Musculoskeletal:     Right lower leg: No edema.     Left lower leg: No edema.  Skin:    General: Skin is warm and dry.     Findings: No rash.  Neurological:     Mental Status: He is alert.        Assessment & Plan:   Moderate episode of recurrent major depressive disorder (HCC) He states that the abilify  gives him no motivation and his younger son had this problem with abilify  in the past.  The patient wants to switch to lexapro where I had a discussion with him where lexapro is the S' isomer and cleaner version of celexa .  We will switch him over and discontinue his abilify .  He has been told he will need to give the medication 6 weeks to see what effect it has.    No follow-ups on file.   Selinda Fleeta Finger, MD

## 2023-10-13 ENCOUNTER — Other Ambulatory Visit: Payer: Self-pay | Admitting: Internal Medicine

## 2023-11-25 ENCOUNTER — Other Ambulatory Visit: Payer: Self-pay | Admitting: Internal Medicine

## 2023-11-25 MED ORDER — GABAPENTIN 300 MG PO CAPS: 300.0000 mg | ORAL_CAPSULE | Freq: Three times a day (TID) | ORAL | 0 refills | Status: AC

## 2023-12-09 ENCOUNTER — Other Ambulatory Visit: Payer: Self-pay | Admitting: Internal Medicine

## 2023-12-10 ENCOUNTER — Ambulatory Visit: Admitting: Internal Medicine

## 2023-12-10 ENCOUNTER — Encounter: Payer: Self-pay | Admitting: Internal Medicine

## 2023-12-10 VITALS — BP 126/70 | HR 88 | Temp 98.0°F | Resp 18 | Ht 66.0 in | Wt 252.1 lb

## 2023-12-10 DIAGNOSIS — F410 Panic disorder [episodic paroxysmal anxiety] without agoraphobia: Secondary | ICD-10-CM | POA: Insufficient documentation

## 2023-12-10 DIAGNOSIS — F330 Major depressive disorder, recurrent, mild: Secondary | ICD-10-CM

## 2023-12-10 DIAGNOSIS — F411 Generalized anxiety disorder: Secondary | ICD-10-CM

## 2023-12-10 MED ORDER — BUSPIRONE HCL 5 MG PO TABS: 5.0000 mg | ORAL_TABLET | Freq: Two times a day (BID) | ORAL | 2 refills | Status: DC

## 2023-12-10 NOTE — Progress Notes (Signed)
 Office Visit  Subjective   Patient ID: Albert Benjamin   DOB: October 01, 1950   Age: 73 y.o.   MRN: [id number]   Chief Complaint Chief Complaint  Patient presents with   Follow-up    3 month     History of Present Illness Albert Benjamin is a 73 yo male who returns today for followup of his depression and anxiety.  I saw him 3 months ago where he felt his abilify  was not working.  I saw him in 06/2023 where we discontinued his seroquel  and added abilify  5mg  daily to his celexa  40mg  daily.    We did switch him from abilify  to lexapro  20mg  dailly.  However, the patient states he stopped this medication about 3 weeks ago as it was making him feel blah.  He states that since stopping the medication, he has felt the best he has in a while.  He states he is not having any problems with depression but he is having anxiety with panic attacks several times a week.  He also is on clonazepam  0.5mg  po BID prn which he takes 3 times per week.  I saw him about 5-6 months ago where his depression and anxiety had worsened.   I previously a few months ago where we had him on celexa  40mg  daily and wellbutrin  75mg  BID but this did not help.  We weaned off wellbutrin  and wanted to try him on vraylar but he could not afford this.  Again, his depression worsened after his back surgery where Dr. Caleen has seen him and kept him on citalopram  40mg  daily.  He continues to have periods of feeling down and sad.  He states he will get a sensation in his stomach and he will take clonazepam  and this disappears.  He denies any panic attacks.  He denies any problems with concentration, fatigue, feelings of worthless, helpless feeling, feelings of guilt, no weight loss, loss of pleasurable activives, no social withdrawal, insomnia and no SI/HI.     Past Medical History Past Medical History:  Diagnosis Date   Allergic rhinitis    Aphakia, left eye 02/03/2020   Secondary insertion of posterior chamber intraocular lens via Yamani scleral  tunnel technique 02/02/2020 left eye   Arthritis    Cataract    bilkateral surgery 3-40 years ago   Chronic pain syndrome    Depression    Erectile dysfunction    GAD (generalized anxiety disorder)    Hyperlipidemia    Hypertension    Lumbar stenosis with neurogenic claudication    Obstructive sleep apnea on CPAP    T2DM (type 2 diabetes mellitus) (HCC)    Vitreous prolapse, left eye 01/11/2020   Wears glasses    Wears hearing aid    both ears     Allergies Allergies  Allergen Reactions   Flexeril  [Cyclobenzaprine ] Other (See Comments)    Hallucinations-reported per patient's account     Medications  Current Outpatient Medications:    cetirizine (ZYRTEC) 10 MG tablet, Take 10 mg by mouth daily., Disp: , Rfl:    clonazePAM  (KLONOPIN ) 0.5 MG tablet, Take 1 tablet (0.5 mg total) by mouth 2 (two) times daily as needed for anxiety., Disp: 60 tablet, Rfl: 2   gabapentin  (NEURONTIN ) 300 MG capsule, Take 1 capsule (300 mg total) by mouth 3 (three) times daily., Disp: 90 capsule, Rfl: 0   glucose blood test strip, Use as instructed, Disp: 100 each, Rfl: 12   hydrochlorothiazide  (MICROZIDE ) 12.5 MG capsule, Take 1  capsule by mouth once daily., Disp: 30 capsule, Rfl: 1   montelukast  (SINGULAIR ) 10 MG tablet, Take 1 tablet by mouth once daily in the evening., Disp: 90 tablet, Rfl: 1   pioglitazone  (ACTOS ) 30 MG tablet, Take 1 tablet (30 mg total) by mouth daily., Disp: 90 tablet, Rfl: 3   pravastatin  (PRAVACHOL ) 40 MG tablet, TAKE ONE TABLET BY MOUTH AT BEDTIME, Disp: 90 tablet, Rfl: 1   Review of Systems Review of Systems  Constitutional:  Negative for chills, fever and malaise/fatigue.  Respiratory:  Negative for cough and shortness of breath.   Cardiovascular:  Negative for chest pain, palpitations and leg swelling.  Gastrointestinal:  Negative for abdominal pain, constipation, diarrhea, nausea and vomiting.  Musculoskeletal:  Negative for myalgias.  Skin:  Negative for itching.   Neurological:  Negative for dizziness, weakness and headaches.       Objective:    Vitals BP 126/70 (BP Location: Left Arm, Patient Position: Sitting, Cuff Size: Normal)   Pulse 88   Temp 98 F (36.7 C)   Resp 18   Ht 5' 6 (1.676 m)   Wt 252 lb 2 oz (114.4 kg)   SpO2 91%   BMI 40.69 kg/m    Physical Examination Physical Exam Constitutional:      Appearance: Normal appearance. He is not ill-appearing.  Cardiovascular:     Rate and Rhythm: Normal rate and regular rhythm.     Pulses: Normal pulses.     Heart sounds: No murmur heard.    No friction rub. No gallop.  Pulmonary:     Effort: Pulmonary effort is normal. No respiratory distress.     Breath sounds: No wheezing, rhonchi or rales.  Abdominal:     General: Abdomen is flat. Bowel sounds are normal. There is no distension.     Palpations: Abdomen is soft.     Tenderness: There is no abdominal tenderness.  Musculoskeletal:     Right lower leg: No edema.     Left lower leg: No edema.  Skin:    General: Skin is warm and dry.     Findings: No rash.  Neurological:     Mental Status: He is alert.        Assessment & Plan:   Panic disorder I am going to start him on buspar 5mg  BID.  He can use his clonazepam  as needed. I want him to call next week to tell us  how he is doing and if we need to go up on the dose of buspar.  Mild episode of recurrent major depressive disorder His depression is currently doing well and he has no complaints about this today.  GAD (generalized anxiety disorder) Plan as above.    Return in about 3 months (around 03/09/2024).   Selinda Fleeta Finger, MD

## 2023-12-10 NOTE — Assessment & Plan Note (Signed)
 His depression is currently doing well and he has no complaints about this today.

## 2023-12-10 NOTE — Assessment & Plan Note (Signed)
 Plan as above.

## 2023-12-10 NOTE — Assessment & Plan Note (Signed)
 I am going to start him on buspar 5mg  BID.  He can use his clonazepam  as needed. I want him to call next week to tell us  how he is doing and if we need to go up on the dose of buspar.

## 2024-01-03 ENCOUNTER — Emergency Department (HOSPITAL_COMMUNITY): Admission: EM | Admit: 2024-01-03 | Discharge: 2024-01-03 | Discharge status: Against Medical Advice

## 2024-01-03 ENCOUNTER — Other Ambulatory Visit: Payer: Self-pay

## 2024-01-03 ENCOUNTER — Encounter (HOSPITAL_COMMUNITY): Payer: Self-pay

## 2024-01-03 DIAGNOSIS — Z5321 Procedure and treatment not carried out due to patient leaving prior to being seen by health care provider: Secondary | ICD-10-CM | POA: Diagnosis not present

## 2024-01-03 DIAGNOSIS — R110 Nausea: Secondary | ICD-10-CM | POA: Diagnosis present

## 2024-01-03 LAB — BASIC METABOLIC PANEL WITH GFR
Anion gap: 11 (ref 5–15)
BUN: 19 mg/dL (ref 8–23)
CO2: 25 mmol/L (ref 22–32)
Calcium: 9.7 mg/dL (ref 8.9–10.3)
Chloride: 104 mmol/L (ref 98–111)
Creatinine, Ser: 0.99 mg/dL (ref 0.61–1.24)
GFR, Estimated: >60 mL/min
Glucose, Bld: 105 mg/dL — ABNORMAL HIGH (ref 70–99)
Potassium: 4.6 mmol/L (ref 3.5–5.1)
Sodium: 140 mmol/L (ref 135–145)

## 2024-01-03 LAB — CBC
HCT: 41.5 % (ref 39.0–52.0)
Hemoglobin: 13.6 g/dL (ref 13.0–17.0)
MCH: 32.2 pg (ref 26.0–34.0)
MCHC: 32.8 g/dL (ref 30.0–36.0)
MCV: 98.1 fL (ref 80.0–100.0)
Platelets: 184 K/uL (ref 150–400)
RBC: 4.23 MIL/uL (ref 4.22–5.81)
RDW: 13.5 % (ref 11.5–15.5)
WBC: 6.1 K/uL (ref 4.0–10.5)
nRBC: 0 % (ref 0.0–0.2)

## 2024-01-03 LAB — URINALYSIS, ROUTINE W REFLEX MICROSCOPIC
Bilirubin Urine: NEGATIVE
Glucose, UA: NEGATIVE mg/dL
Hgb urine dipstick: NEGATIVE
Ketones, ur: NEGATIVE mg/dL
Leukocytes,Ua: NEGATIVE
Nitrite: NEGATIVE
Protein, ur: NEGATIVE mg/dL
Specific Gravity, Urine: 1.018 (ref 1.005–1.030)
pH: 6 (ref 5.0–8.0)

## 2024-01-03 NOTE — ED Triage Notes (Signed)
 Pt gives verbal consent for mse

## 2024-01-03 NOTE — ED Provider Triage Note (Signed)
 Emergency Medicine Provider Triage Evaluation Note  Albert Benjamin , a 73 y.o. male  was evaluated in triage.  Pt complains of nausea, concern for dehydration.  Patient notes that he was switched to a new antianxiety medication, BuSpar , several weeks ago.  Since then he has had very minimal appetite and is concerned that he is becoming dehydrated, he reports just the thought of food/water makes me feel sick.  Denies abdominal pain.  Has an appointment with his doctor on Tuesday.  Review of Systems  Positive: As above Negative: As above  Physical Exam  BP 134/75 (BP Location: Right Arm)   Pulse 75   Temp 97.7 F (36.5 C) (Oral)   Resp 16   SpO2 99%  Gen:   Awake, no distress   Resp:  Normal effort  MSK:   Moves extremities without difficulty  Other:    Medical Decision Making  Medically screening exam initiated at 4:48 PM.  Appropriate orders placed.  Albert Benjamin was informed that the remainder of the evaluation will be completed by another provider, this initial triage assessment does not replace that evaluation, and the importance of remaining in the ED until their evaluation is complete.     Albert Benjamin, [place] 01/03/24 [id number]

## 2024-01-03 NOTE — ED Triage Notes (Signed)
 Quick triage note: Pt reports nausea and decreased PO intake x 3 weeks, reports symptoms started with recent medication changes with antidepressants. Concerned he is getting  dehydrated.

## 2024-01-03 NOTE — ED Notes (Signed)
 Pt left without being seen by EDP due to long ED wait times.

## 2024-01-06 ENCOUNTER — Ambulatory Visit: Admitting: Internal Medicine

## 2024-01-06 ENCOUNTER — Encounter: Payer: Self-pay | Admitting: Internal Medicine

## 2024-01-06 VITALS — BP 124/78 | HR 81 | Temp 97.9°F | Resp 18 | Ht 66.0 in | Wt 250.4 lb

## 2024-01-06 DIAGNOSIS — E1169 Type 2 diabetes mellitus with other specified complication: Secondary | ICD-10-CM | POA: Diagnosis not present

## 2024-01-06 DIAGNOSIS — I10 Essential (primary) hypertension: Secondary | ICD-10-CM

## 2024-01-06 DIAGNOSIS — F331 Major depressive disorder, recurrent, moderate: Secondary | ICD-10-CM

## 2024-01-06 DIAGNOSIS — F411 Generalized anxiety disorder: Secondary | ICD-10-CM | POA: Diagnosis not present

## 2024-01-06 DIAGNOSIS — E785 Hyperlipidemia, unspecified: Secondary | ICD-10-CM

## 2024-01-06 NOTE — Assessment & Plan Note (Signed)
 His diabetes and cholesterol is well-controlled.  He is due for diabetic eye exam.

## 2024-01-06 NOTE — Progress Notes (Signed)
 "  Office Visit  Subjective   Patient ID: Albert Benjamin   DOB: 1950/07/15   Age: 73 y.o.   MRN: [id number]   Chief Complaint Chief Complaint  Patient presents with   Follow-up    Med dosage     History of Present Illness 73 years old male who is here for follow-up.  He has a history of anxiety and depression and different medications were tried but per patient nothing works.  He was started on citalopram  but after a while that medicine stopped working.  She was started on escitalopram  and dose was increased to 20 mg daily.  Aripiprazole  5 mg was as added as adjunct therapy but that did not help either.  Patient stopped taking both medication.  He says that the only medicine that is helping his clonazepam  0.5 mg that he takes twice a day.  He was also started on buspirone  5 mg twice a day but he says that that is not helping with his anxiety.  He denies having any suicidal ideation.  He also has a history of hypertension and his blood pressure is well-controlled.  He has type 2 diabetes mellitus and dyslipidemia and I have reviewed his labs.  His hemoglobin A1c was 6.4 and his LDL was also target controlled.  He has scheduled eye appointment for diabetic eye exam in [place].  Past Medical History Past Medical History:  Diagnosis Date   Allergic rhinitis    Aphakia, left eye 02/03/2020   Secondary insertion of posterior chamber intraocular lens via Yamani scleral tunnel technique 02/02/2020 left eye   Arthritis    Cataract    bilkateral surgery 3-40 years ago   Chronic pain syndrome    Depression    Erectile dysfunction    GAD (generalized anxiety disorder)    Hyperlipidemia    Hypertension    Lumbar stenosis with neurogenic claudication    Obstructive sleep apnea on CPAP    T2DM (type 2 diabetes mellitus) (HCC)    Vitreous prolapse, left eye 01/11/2020   Wears glasses    Wears hearing aid    both ears     Allergies Allergies[1]   Review of Systems Review of Systems   Constitutional: Negative.   HENT: Negative.    Respiratory: Negative.    Cardiovascular: Negative.   Gastrointestinal: Negative.   Neurological: Negative.   Psychiatric/Behavioral:  Positive for depression. The patient is nervous/anxious.        Objective:    Vitals BP 124/78   Pulse 81   Temp 97.9 F (36.6 C)   Resp 18   Ht 5' 6 (1.676 m)   Wt 250 lb 6 oz (113.6 kg)   SpO2 97%   BMI 40.41 kg/m    Physical Examination Physical Exam Constitutional:      Appearance: Normal appearance. He is obese.  Cardiovascular:     Rate and Rhythm: Normal rate and regular rhythm.     Heart sounds: Normal heart sounds.  Pulmonary:     Effort: Pulmonary effort is normal.     Breath sounds: Normal breath sounds.  Abdominal:     General: Bowel sounds are normal.     Palpations: Abdomen is soft.  Neurological:     General: No focal deficit present.     Mental Status: He is alert and oriented to person, place, and time.        Assessment & Plan:   Essential hypertension His blood pressure is well-controlled.  Dyslipidemia associated with  type 2 diabetes mellitus (HCC) His diabetes and cholesterol is well-controlled.  He is due for diabetic eye exam.  Moderate episode of recurrent major depressive disorder (HCC) His depression is not controlled.  I have started him to restart escitalopram  10 mg daily and I have given him sample of Rexulti 0.5 mg daily for 1 week then 1 mg daily.  He will call if he has any side effects from this medicine or if he has any suicidal ideation.  GAD (generalized anxiety disorder) He will continue using clonazepam  0.5 mg twice a day as needed and buspirone  5 mg twice a day.    Return in about 1 month (around 02/06/2024).   Jaydan Meidinger, MD      [1]  Allergies Allergen Reactions   Flexeril  [Cyclobenzaprine ] Other (See Comments)    Hallucinations-reported per patient's account   "

## 2024-01-06 NOTE — Assessment & Plan Note (Signed)
 His depression is not controlled.  I have started him to restart escitalopram  10 mg daily and I have given him sample of Rexulti 0.5 mg daily for 1 week then 1 mg daily.  He will call if he has any side effects from this medicine or if he has any suicidal ideation.

## 2024-01-06 NOTE — Assessment & Plan Note (Signed)
His blood pressure is well-controlled 

## 2024-01-06 NOTE — Assessment & Plan Note (Signed)
 He will continue using clonazepam  0.5 mg twice a day as needed and buspirone  5 mg twice a day.

## 2024-01-14 ENCOUNTER — Other Ambulatory Visit: Payer: Self-pay

## 2024-01-19 ENCOUNTER — Other Ambulatory Visit: Payer: Self-pay | Admitting: Internal Medicine

## 2024-01-19 MED ORDER — ONDANSETRON HCL 4 MG PO TABS: 4.0000 mg | ORAL_TABLET | Freq: Three times a day (TID) | ORAL | 1 refills | Status: AC | PRN

## 2024-01-28 ENCOUNTER — Encounter: Payer: Self-pay | Admitting: Nurse Practitioner

## 2024-01-28 ENCOUNTER — Ambulatory Visit: Admitting: Nurse Practitioner

## 2024-01-28 VITALS — BP 124/80 | HR 69 | Temp 97.2°F | Resp 18 | Wt 236.4 lb

## 2024-01-28 DIAGNOSIS — F419 Anxiety disorder, unspecified: Secondary | ICD-10-CM

## 2024-01-28 DIAGNOSIS — F331 Major depressive disorder, recurrent, moderate: Secondary | ICD-10-CM

## 2024-01-28 DIAGNOSIS — E1141 Type 2 diabetes mellitus with diabetic mononeuropathy: Secondary | ICD-10-CM | POA: Insufficient documentation

## 2024-01-28 DIAGNOSIS — F410 Panic disorder [episodic paroxysmal anxiety] without agoraphobia: Secondary | ICD-10-CM

## 2024-01-28 MED ORDER — BUSPIRONE HCL 10 MG PO TABS: 10.0000 mg | ORAL_TABLET | Freq: Two times a day (BID) | ORAL | 0 refills | Status: AC

## 2024-01-28 MED ORDER — DULOXETINE HCL 30 MG PO CPEP: 30.0000 mg | ORAL_CAPSULE | Freq: Every day | ORAL | 0 refills | Status: AC

## 2024-01-28 NOTE — Assessment & Plan Note (Signed)
 SABRA

## 2024-01-28 NOTE — Assessment & Plan Note (Signed)
" °  °  Discontinue Lexapro  10 mg po daily for depression/anxiety Discontinue Rexulti 0.5 mg po daily for MDD Start Duloxetine  30 mg po at bedtime for depression/anxiety and neuropathy pain Increase Buspar  5 mg to 10 mg po bid for anxiety Educated and dicussed long term use of benzodiazepine and it's risk. Will continue at 0.5 mg ONLY prn and not daily use with future decreases to 0.25 mg prn. Patient verbalized understanding and agreement with plan "

## 2024-01-28 NOTE — Progress Notes (Signed)
 "  Subjective:  I was referred to you to have my psychiatric medications managed.    HPI: Albert Benjamin is a 74 y.o. male presenting on 01/28/2024 for medication management. He is a new patient to this provider and referred to me to mange his psychiatric medications.   Patient carries a diagnosis of depression and anxiety. He is currently on Lexapro  10 mg, Buspar  5 mg bid, Clonazepam  0.5 mg bid prn, but admits he takes it daily, and Rexulti 0.5 mg. He reports he was on Lexapro  20 mg but went down to 10 mg when he was started on Rexulti. He reports he has been feeling nauseous and lightheaded for the last 4-5 weeks. He reports he woke up yesterday with nausea and took Zofran  but that didn't help, therefore he took some Phenergan  and Clonazepam  and lied back down for a while and this helped. He reports the only new medication he started on was Rexulti around that time and could possibly correlate and associate his symptoms with this medication. He reports he can't tell if the Buspar  is really doing anything for him but denies any side effects.  He has been on Celexa  40 mg for 12-15 years and was changed to Lexapro  due to feeling down and depressed and felt maybe a change in medication was needed due to tolerance. However, he reports he doesn't feel a difference in between being on the Lexapro  vs the Celexa . He reports he had his second back surgery about a year ago and this is when he started feeling down as he is used to being outside and working on his car and he couldn't due this due to the surgery, limited mobility and back pain, which likely contributed to his depression and anxiety. He reports the more anxious he got the more depressed he got and vice versa. He reports he no longer has back pain but has neuropathy pain and can't walk for an extended amount of time.   He lives at home with his wife and they take care of each other, he likes working on his car outside, he denies smoking and drinking  alcohol . He denies poor appetite, sleep issues, psychosis, delusions, suicidal or homicidal ideations.   We discussed discontinuing Lexapro  10 mg and Rexulti 0.5 mg. We will start on Duloxetine  30 mg po at bedtime for depression/anxiety and to help with his neuropathy pain. We discussed in detail risk of long term use of benzodiazepine and will only take Clonazepam  0.5 mg as needed for anxiety/panic attacks and not for maintenance for GAD and will work towards decreasing the dose to 0.25 mg prn. Patient verbalized understanding and agreement with plan.    ROS: Negative unless specifically indicated above in HPI.   Relevant past medical history reviewed and updated as indicated.   Allergies and medications reviewed and updated.   Current Outpatient Medications  Medication Instructions   busPIRone  (BUSPAR ) 10 mg, Oral, 2 times daily   cetirizine (ZYRTEC) 10 mg, Daily   clonazePAM  (KLONOPIN ) 0.5 mg, Oral, 2 times daily PRN   DULoxetine  (CYMBALTA ) 30 mg, Oral, Daily   gabapentin  (NEURONTIN ) 300 mg, Oral, 3 times daily   glucose blood test strip Use as instructed   hydrochlorothiazide  (MICROZIDE ) 12.5 mg, Oral, Daily   montelukast  (SINGULAIR ) 10 mg, Oral, Every evening   ondansetron  (ZOFRAN ) 4 mg, Oral, Every 8 hours PRN   pioglitazone  (ACTOS ) 30 mg, Oral, Daily   pravastatin  (PRAVACHOL ) 40 mg, Oral, Daily at bedtime  Allergies[1]  Objective:   There were no vitals taken for this visit.   Physical Exam Constitutional:      Appearance: Normal appearance.  HENT:     Head: Normocephalic.  Eyes:     Conjunctiva/sclera: Conjunctivae normal.  Cardiovascular:     Rate and Rhythm: Normal rate and regular rhythm.  Pulmonary:     Effort: Pulmonary effort is normal.     Breath sounds: Normal breath sounds.  Skin:    General: Skin is warm and dry.  Neurological:     General: No focal deficit present.     Mental Status: He is alert and oriented to person, place, and time.   Psychiatric:        Mood and Affect: Mood normal.        Behavior: Behavior normal.        Thought Content: Thought content normal.        Judgment: Judgment normal.    Assessment & Plan:   Assessment & Plan Moderate episode of recurrent major depressive disorder (HCC)     Anxiety     Panic disorder (episodic paroxysmal anxiety)     Diabetic mononeuropathy associated with type 2 diabetes mellitus (HCC)    Discontinue Lexapro  10 mg po daily for depression/anxiety Discontinue Rexulti 0.5 mg po daily for MDD Start Duloxetine  30 mg po at bedtime for depression/anxiety and neuropathy pain Increase Buspar  5 mg to 10 mg po bid for anxiety Educated and dicussed long term use of benzodiazepine and it's risk. Will continue at 0.5 mg ONLY prn and not daily use with future decreases to 0.25 mg prn. Patient verbalized understanding and agreement with plan   Follow up plan: Return in about 4 weeks (around 02/25/2024).  Florencia Cousin, NP     [1]  Allergies Allergen Reactions   Flexeril  [Cyclobenzaprine ] Other (See Comments)    Hallucinations-reported per patient's account   "

## 2024-01-29 ENCOUNTER — Other Ambulatory Visit: Payer: Self-pay

## 2024-01-29 MED ORDER — PRAVASTATIN SODIUM 40 MG PO TABS: 40.0000 mg | ORAL_TABLET | Freq: Every day | ORAL | 1 refills | Status: AC

## 2024-01-29 MED ORDER — HYDROCHLOROTHIAZIDE 12.5 MG PO CAPS: 12.5000 mg | ORAL_CAPSULE | Freq: Every day | ORAL | 1 refills | Status: AC

## 2024-02-04 ENCOUNTER — Ambulatory Visit: Admitting: Nurse Practitioner

## 2024-02-04 NOTE — Progress Notes (Unsigned)
 "  Subjective:  I have been nauseous.    HPI: Albert Benjamin is a 74 y.o. male presenting on 02/04/2024 in office for nausea.  Patient was seen last Wednesday for medication management for his psychiatric medication. He was started on Duloxetine  30 mg and increase in Buspar  from 5 mg bid to 10 mg bid. He was discontinued off Lexapro  10 mg and Rexulti 0.5 mg as he felt his nausea started around the time he was started on Rexulti. Patient called yesterday with c/o of nausea that started on Saturday without vomiting. He reports he started on the Duloxetine  on Wednesday night and he was fine on Thursday and Friday. I did not feel like it was associated with the medication, however, we decided to hold off on the Buspar  and Duloxetine  to see if that would stop the nausea. Patient reports today he is still nauseous. He took 12.5 mg of phenergan  and this helped him. He denies any associated symptoms such as abdominal pan, lightheadedness, or dizziness.   Patient has a history of constipation. He reports he did take 1 whole bottle of OTC magnesium citrate last week because he was so constipated and that relived his constipation. Today he has not had a bowel movement since Monday.          ROS: Negative unless specifically indicated above in HPI.   Relevant past medical history reviewed and updated as indicated.   Allergies and medications reviewed and updated.   Current Outpatient Medications  Medication Instructions   busPIRone  (BUSPAR ) 10 mg, Oral, 2 times daily   cetirizine (ZYRTEC) 10 mg, Daily   clonazePAM  (KLONOPIN ) 0.5 mg, Oral, 2 times daily PRN   DULoxetine  (CYMBALTA ) 30 mg, Oral, Daily   gabapentin  (NEURONTIN ) 300 mg, Oral, 3 times daily   glucose blood test strip Use as instructed   hydrochlorothiazide  (MICROZIDE ) 12.5 mg, Oral, Daily   montelukast  (SINGULAIR ) 10 mg, Oral, Every evening   ondansetron  (ZOFRAN ) 4 mg, Oral, Every 8 hours PRN   pioglitazone  (ACTOS ) 30  mg, Oral, Daily   pravastatin  (PRAVACHOL ) 40 mg, Oral, Daily at bedtime     Allergies[1]  Objective:   There were no vitals taken for this visit.   Physical Exam Constitutional:      Appearance: Normal appearance.  HENT:     Head: Normocephalic.  Eyes:     Conjunctiva/sclera: Conjunctivae normal.  Cardiovascular:     Rate and Rhythm: Normal rate and regular rhythm.  Pulmonary:     Effort: Pulmonary effort is normal.     Breath sounds: Normal breath sounds.  Skin:    General: Skin is warm and dry.  Neurological:     General: No focal deficit present.     Mental Status: He is alert and oriented to person, place, and time.  Psychiatric:        Mood and Affect: Mood normal.        Behavior: Behavior normal.        Thought Content: Thought content normal.        Judgment: Judgment normal.     Eye Contact:  {BHH EYE CONTACT:22684}  Speech:  {Speech:22685}  Volume:  {Volume (PAA):22686}  Mood:  {BHH MOOD:22306}  Affect:  {Affect (PAA):22687}  Thought Process:  {Thought Process (PAA):22688}  Orientation:  {BHH ORIENTATION (PAA):22689}  Thought Content:  {Thought Content:22690}  Suicidal Thoughts:  {ST/HT (PAA):22692}  Homicidal Thoughts:  {ST/HT (PAA):22692}  Memory:  {BHH FZFNMB:77118}  Judgement:  {Judgement (PAA):22694}  Insight:  {  Insight (PAA):22695}  Psychomotor Activity:  {Psychomotor (PAA):22696}  Concentration:  {Concentration:21399}  Recall:  {BHH GOOD/FAIR/POOR:22877}  Fund of Knowledge:  {BHH GOOD/FAIR/POOR:22877}  Language:  {BHH GOOD/FAIR/POOR:22877}  Akathisia:  {BHH YES OR NO:22294}  Handed:  {Handed:22697}  AIMS (if indicated):     Assets:  {Assets (PAA):22698}  ADL's:  {BHH JIO'D:77709}  Cognition:  {chl bhh cognition:304700322}  Sleep:        Assessment & Plan:   Assessment & Plan     Follow up plan: No follow-ups on file.  Florencia Cousin, NP        [1] Allergies Allergen Reactions   Flexeril  [Cyclobenzaprine ] Other (See  Comments)    Hallucinations-reported per patient's account  "

## 2024-02-06 ENCOUNTER — Ambulatory Visit: Admitting: Internal Medicine

## 2024-02-25 ENCOUNTER — Ambulatory Visit: Admitting: Nurse Practitioner

## 2024-03-08 ENCOUNTER — Ambulatory Visit: Admitting: Internal Medicine

## 2024-03-26 ENCOUNTER — Ambulatory Visit: Admitting: Family Medicine
